# Patient Record
Sex: Female | Born: 1945 | Race: Black or African American | Hispanic: No | Marital: Single | Smoking: Former smoker
Health system: Southern US, Community
[De-identification: ages and names within clinical notes are randomized; demographics above are authoritative.]

## PROBLEM LIST (undated history)

## (undated) DIAGNOSIS — R3915 Urgency of urination: Secondary | ICD-10-CM

## (undated) DIAGNOSIS — E039 Hypothyroidism, unspecified: Secondary | ICD-10-CM

## (undated) DIAGNOSIS — K5790 Diverticulosis of intestine, part unspecified, without perforation or abscess without bleeding: Secondary | ICD-10-CM

## (undated) DIAGNOSIS — Z9289 Personal history of other medical treatment: Secondary | ICD-10-CM

## (undated) DIAGNOSIS — I4892 Unspecified atrial flutter: Secondary | ICD-10-CM

## (undated) DIAGNOSIS — I340 Nonrheumatic mitral (valve) insufficiency: Secondary | ICD-10-CM

## (undated) DIAGNOSIS — E119 Type 2 diabetes mellitus without complications: Secondary | ICD-10-CM

## (undated) DIAGNOSIS — I428 Other cardiomyopathies: Secondary | ICD-10-CM

## (undated) DIAGNOSIS — I4510 Unspecified right bundle-branch block: Secondary | ICD-10-CM

## (undated) DIAGNOSIS — Q246 Congenital heart block: Secondary | ICD-10-CM

## (undated) DIAGNOSIS — I4891 Unspecified atrial fibrillation: Secondary | ICD-10-CM

## (undated) DIAGNOSIS — M1990 Unspecified osteoarthritis, unspecified site: Secondary | ICD-10-CM

## (undated) DIAGNOSIS — I10 Essential (primary) hypertension: Secondary | ICD-10-CM

## (undated) DIAGNOSIS — E785 Hyperlipidemia, unspecified: Secondary | ICD-10-CM

## (undated) HISTORY — PX: COLONOSCOPY: SHX174

## (undated) HISTORY — PX: US ECHOCARDIOGRAPHY: HXRAD669

## (undated) HISTORY — DX: Essential (primary) hypertension: I10

## (undated) HISTORY — DX: Unspecified atrial flutter: I48.92

## (undated) HISTORY — DX: Congenital heart block: Q24.6

## (undated) HISTORY — DX: Unspecified right bundle-branch block: I45.10

## (undated) HISTORY — PX: ABDOMINAL HYSTERECTOMY: SHX81

## (undated) HISTORY — DX: Nonrheumatic mitral (valve) insufficiency: I34.0

## (undated) HISTORY — DX: Hyperlipidemia, unspecified: E78.5

## (undated) HISTORY — DX: Unspecified atrial fibrillation: I48.91

## (undated) HISTORY — DX: Other cardiomyopathies: I42.8

---

## 1999-10-02 ENCOUNTER — Encounter: Admission: RE | Admit: 1999-10-02 | Discharge: 1999-10-02 | Payer: Self-pay | Admitting: *Deleted

## 1999-10-02 ENCOUNTER — Encounter: Payer: Self-pay | Admitting: *Deleted

## 2001-02-17 ENCOUNTER — Encounter: Payer: Self-pay | Admitting: Internal Medicine

## 2001-02-17 ENCOUNTER — Encounter: Admission: RE | Admit: 2001-02-17 | Discharge: 2001-02-17 | Payer: Self-pay | Admitting: Internal Medicine

## 2003-08-26 DIAGNOSIS — Q246 Congenital heart block: Secondary | ICD-10-CM

## 2003-08-26 HISTORY — DX: Congenital heart block: Q24.6

## 2003-09-14 HISTORY — PX: CARDIOVASCULAR STRESS TEST: SHX262

## 2003-10-20 HISTORY — PX: CARDIAC CATHETERIZATION: SHX172

## 2003-11-03 ENCOUNTER — Encounter (INDEPENDENT_AMBULATORY_CARE_PROVIDER_SITE_OTHER): Payer: Self-pay | Admitting: Cardiology

## 2003-11-03 ENCOUNTER — Ambulatory Visit (HOSPITAL_COMMUNITY): Admission: RE | Admit: 2003-11-03 | Discharge: 2003-11-03 | Payer: Self-pay | Admitting: Cardiology

## 2004-01-29 DIAGNOSIS — Z9889 Other specified postprocedural states: Secondary | ICD-10-CM | POA: Insufficient documentation

## 2004-01-29 DIAGNOSIS — I428 Other cardiomyopathies: Secondary | ICD-10-CM

## 2004-01-29 HISTORY — DX: Other cardiomyopathies: I42.8

## 2004-02-06 ENCOUNTER — Inpatient Hospital Stay (HOSPITAL_COMMUNITY): Admission: RE | Admit: 2004-02-06 | Discharge: 2004-02-14 | Payer: Self-pay | Admitting: Cardiothoracic Surgery

## 2004-02-06 ENCOUNTER — Encounter (INDEPENDENT_AMBULATORY_CARE_PROVIDER_SITE_OTHER): Payer: Self-pay | Admitting: Specialist

## 2004-02-06 HISTORY — PX: MITRAL VALVE ANNULOPLASTY: SHX2038

## 2004-02-13 HISTORY — PX: PERMANENT PACEMAKER INSERTION: SHX6023

## 2004-02-21 DIAGNOSIS — I4892 Unspecified atrial flutter: Secondary | ICD-10-CM

## 2004-02-21 HISTORY — DX: Unspecified atrial flutter: I48.92

## 2004-03-01 ENCOUNTER — Encounter: Admission: RE | Admit: 2004-03-01 | Discharge: 2004-03-01 | Payer: Self-pay | Admitting: Cardiothoracic Surgery

## 2004-03-05 ENCOUNTER — Encounter (HOSPITAL_COMMUNITY): Admission: RE | Admit: 2004-03-05 | Discharge: 2004-06-03 | Payer: Self-pay | Admitting: Cardiology

## 2008-11-16 ENCOUNTER — Emergency Department (HOSPITAL_COMMUNITY): Admission: EM | Admit: 2008-11-16 | Discharge: 2008-11-16 | Payer: Self-pay | Admitting: Family Medicine

## 2010-02-18 ENCOUNTER — Encounter: Payer: Self-pay | Admitting: Cardiothoracic Surgery

## 2010-06-15 NOTE — Op Note (Signed)
NAMESHANEESE, [name]               ACCOUNT NO.:  [id number]   MEDICAL RECORD NO.:  [id number]          PATIENT TYPE:  INP   LOCATION:  2013                         FACILITY:  [facility]   PHYSICIAN:  Richard A. Alanda Amass, M.D.DATE OF BIRTH:  Mar 30, 1945   DATE OF PROCEDURE:  02/13/2004  DATE OF DISCHARGE:                                 OPERATIVE REPORT   PROCEDURE:  Implantation of permanent DDD-R Medtronic Enpulse model number  [id number], serial number [id number],  pulse generator, with new bipolar active-  fixation steroid-eluting in-line coaxial Medtronic atrial electrode, model  number 5076-52 cm, serial  number [id number], and using existing epicardial  screw-in unipolar Medtronic number 5071-53 cm, serial number [id number],  electrode (date of implantation February 06, 2004).  Abandoning capped,  retained in pocket, second epicardial unipolar Medtronic screw-in electrode.   IMPLANTING PHYSICIAN:  Richard A. Alanda Amass, M.D.   COMPLICATIONS:  None.   ESTIMATED BLOOD LOSS:  Approximately 50 mL.   ANESTHESIA:  Valium 5 mg p.o. premedication, 6 mg Nubain IV in divided  doses, 2 mg Versed.   Lopressor 5 mg IV x2, 1% local Xylocaine, 1 g of Ancef IV antibiotic  prophylaxis.   PREOPERATIVE DIAGNOSES:  1.  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  2.  Severe mitral regurgitation requiring mitral valve repair with anterior      leaflet reconstruction and Edwards annuloplasty ring February 06, 2004.  3.  Persistent postoperative high-grade heart block with well-preserved      ejection fraction, 50%, on MUGA scan, with temporary pacing.  4.  Normal coronary arteries.  5.  Systemic hypertension.  6.  Hyperlipidemia.  7.  Permanent epicardial unipolar leads, screw-in, Medtronic 5071-53 cm, at      time of mitral valve repair, February 06, 2004, with excellent acute R-      waves and thresholds.   POSTOPERATIVE DIAGNOSES:  1.   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  2.  Severe mitral regurgitation requiring mitral valve repair with anterior      leaflet reconstruction and Edwards annuloplasty ring February 06, 2004.  3.  Persistent postoperative high-grade heart block with well-preserved      ejection fraction, 50%, on MUGA scan, with temporary pacing.  4.  Normal coronary arteries.  5.  Systemic hypertension.  6.  Hyperlipidemia.  7.  Permanent epicardial unipolar leads, screw-in, Medtronic 5071-53 cm, at      time of mitral valve repair, February 06, 2004, with excellent acute R-      waves and thresholds.   PROCEDURE:  The patient was brought to the second floor CP lab in a  postabsorptive state after premedication with 5 mg Valium p.o.  She was  temporarily pacing, which was turned down to 40 per minute back-up, through  her temporary epicardial leads.  She was premedicated with 1 g of Ancef and  5 mg of Valium p.o.  The left anterior chest was prepped, draped in the  usual manner, 1%  Xylocaine was used for local anesthesia.  Fluoroscopy was  used to locate the prior permanent screw-in epicardial leads left at the  time of mitral valve repair.  A left infraclavicular transverse incision was  performed and brought down to the prepectoral fascia using blunt dissection,  electrocautery to control hemostasis, and 1% Xylocaine anesthesia  throughout.  A pulse generator pocket was then formed.  Because of eschar  over the previous parasternal incision from her temporary wires and ability  to get to them in the least traumatic way, it was elected to open the  previous incision.  This was done with a short transverse incision.  The  previous sutures were removed.  Using blunt dissection, the electrodes were  located and identified.   One of the electrodes was uncapped, which had the best R-waves and excellent  threshold at implant.  Threshold testing was  performed on this unipolar  ventricular electrode.   R equals 9.4 mV, impedance 404 Ohms, minimal threshold for capture is 0.9 V,  current 0.9 mA.  It was elected to use this electrode.  The second electrode  was identified and the silicone cap was kept in place.  A small tunnel was  then made from the pacemaker pocket to the parasternal subcutaneous lead  location, and the leads were pulled through to the pacemaker pocket without  difficulty.  The subclavian vein was then entered with a single anterior  puncture using an 18 thin-walled needle under fluoroscopic control entering  the extrathoracic subclavian.  An 8 French peel-away Cook introducer was  then inserted over a stainless steel J-tip guidewire, which was retained  until the lead was positioned.  The atrial lead was inserted through the  peel-away sheath.  The sheath was peeled away.  Under fluoroscopic control  the electrode was inserted into the right atrial appendage using the  preformed J guidewire, which was then removed after active fixation with  screwing in the lead under fluoroscopic control.  Threshold testing was then  performed, unipolar and bipolar.   Bipolar atrial:  P equals 3.6 mV, threshold 0.8 V, resistance 484 Ohms.   Unipolar atrial:  P equals 2.0 mV, resistance 379 Ohms, threshold 0.4 V.   There was no retrograde V-A conduction with ventricular pacing and atrial  recording through the implanted electrodes.  Current of injury and slew rate  were good for both atrial and ventricular electrodes.   The capped ventricular electrode (serial number [id number], DOI February 06, 2004) was looped and positioned in the inferior portion of the pocket behind  the generator.  The pocket was irrigated with 500 mg of kanamycin solution,  as was the parasternal incision.  The generator was hooked to the atrium in the proper AV sequence with the single hex nut tightened.  The generator was  delivered into the pocket with  the electrodes looped behind it, loosely  secured to the underlying muscle and fascia with a #1 silk suture to prevent  migration.  The subcutaneous tissue was closed with two separate running  layers of 2-0 Vicryl suture, and the skin was closed with 4-0 subcuticular  Vicryl suture.  The parasternal incision was likewise closed with two  separate running layers of 2-0 Vicryl and then the skin was closed with 4-0  subcuticular Vicryl.  Steri-Strips were applied.  Fluoroscopy showed  good position of the ventricular electrodes and the epicardial LV  electrodes.  There was no pneumothorax.  The sponge count was correct.  The  patient was transferred to the holding area for postoperative care and  programming in stable condition.      RAW/MEDQ  D:  02/13/2004  T:  02/13/2004  Job:  [id number]   cc:   Sheliah Plane, MD  [address]  [place]  [place] [address]   Cristy Hilts. Jacinto Halim, MD  [address]  [place]  [place] [address]  Fax: [phone]   Merlene Laughter. Renae Gloss, M.D.  [address]  [address]  [place]  [place] [address]  Fax: [phone]

## 2010-06-15 NOTE — Op Note (Signed)
[name]               ACCOUNT NO.:  [id number]   MEDICAL RECORD NO.:  [id number]          PATIENT TYPE:  INP   LOCATION:  2310                         FACILITY:  [facility]   PHYSICIAN:  Sheliah Plane, MD    DATE OF BIRTH:  Jun 11, 1945   DATE OF PROCEDURE:  02/06/2004  DATE OF DISCHARGE:                                 OPERATIVE REPORT   PREOPERATIVE DIAGNOSIS:  Severe mitral regurgitation, congenital heart  block.   POSTOPERATIVE DIAGNOSIS:  Severe mitral regurgitation, congenital heart  block.   SURGICAL PROCEDURE:  Mitral valve repair with reconstruction of anterior  leaflet cordis with gortex and ring annuloplasty and placement of two left  ventricular screw-in epicardial leads for possible biventricular pacing.  Closure of the left atrial appendage.   SURGEON:  Sheliah Plane, MD   FIRST ASSISTANT:  Toribio Harbour, N.P.   BRIEF HISTORY:  Patient is a 65 year old female who has been known for at  least several years to have congenital heart block, running heart rates in  the 40s, relatively asymptomatic; however, because of increasing shortness  of breath and especially dyspnea with exertion, she was referred to Dr.  Jacinto Halim for further evaluation.  Echocardiogram showed severe mitral  regurgitation.  Cardiac catheterization showed small but patent coronary  arteries without any significant stenosis.  Because of the severe mitral  regurgitation, mitral valve repair and/or replacement was recommended to the  patient.  After consultation with Dr. Amil Amen, in addition, who concurred,  patient agreed to proceed with mitral valve repair and placement of  epicardial leads.   DESCRIPTION OF PROCEDURE:  With Swan-Ganz and arterial line monitors in  place, the patient underwent general endotracheal anesthesia without  incidence.  The skin over the chest and lungs was prepped with Betadine and  draped in the usual sterile manner.  A transesophageal echo probe was  placed, and the findings are dictated in a separate note by Dr. Krista Blue;  however, in summary, the patient was noted to have severe mitral  regurgitation.  On close examination of the mitral valve, it appeared that  the defect was a central regurgitant jet related to annular and ventricular  dilatation.  In addition, on the anterior leaflet at P3, there was an area  of over-riding anterior leaflet with a posterior jet hugging the posterior  wall of the atrium.  A median sternotomy was performed.  The patient was  systemically heparinized.  The ascending aorta was cannulated superiorly,  and inferior vena cava cannula was replaced.  A retrograde cardioplegia  catheter was replaced.  An aortic root vent cardioplegia needle was then  introduced into the ascending aorta.  Patient was placed on cardiopulmonary  bypass, 2.4 L/min/m2.  The patient's body temperature was cooled to 30  degrees.  Aortic cross-clamp was applied, and 500 cc of cold potassium  cardioplegia was administered antegrade.  In addition, retrograde  cardioplegia was administered.  Attention was then turned to the left  interatrial groove where the left atriotomy was performed along the  interatrial groove with proper retraction.  The mitral valve was visualized.  Careful  inspection confirmed the TEE findings.  There were no ruptured  cordis.  We proceed with repair, first by placing two separate 5.0 Gore-Tex  sutures along the P3 area of the anterior leaflet to shorten the cordis in  this area.  The anterior leaflet and annulus were sized for a [address] angioplasty ring, model 4100, serial [id number].  Then #2 Tycron  sutures were placed circumferentially around the annulus, and the ring  secured in place.  With the combination of the annuloplasty ring and the  shortening of the cordal attachments of P3, a very competent valve was  achieved.  The patient's body temperature was gradually rewarmed.  Throughout the  procedure, intermittent retrograde cold blood cardioplegia  was administered through the coronary sinus.  The atriotomy was closed as  the left atrium was deaired.  An aortic cross clamp was removed.  Total  cross clamp time was 103 minutes.  A 16-gauge needle was introduced into the  left ventricular apex, further deairing the ventricle.  The patient  spontaneously converted back to her preop complete heart block.  Two atrial  and ventricular pacing wires were applied.  Prior to removal of the cross  clamp, two screw-in epicardial leads were placed on the lateral wall of the  left ventricle.  In addition, while the atrium was opened, a 4-0 Prolene  suture was used in a running manner to obliterate the opening of the left  atrial appendage.  With the patient rewarmed and DDD pacing, the mitral  valve was again inspected with the TEE, and was without any regurgitation.  Patient was then ventilated and weaned from cardiopulmonary bypass without  difficulty.  She remained hemodynamically stable and was decannulated in the  usual fashion.  Protamine sulfate was administered.  The pericardium was  loosely reapproximated.  An opening in the lateral side of the pericardium  was created to allow passage of the epicardial leads through the left chest  and into a subcutaneous pocket created in the left infraclavicular area.  The leads were tested, and the Medtronic screw-in lead with the serial  #[id number] Vicryl had a voltage threshold of 0.5 volts, an R wave of 8.1  millivolts, impedance of 455 ohm.  The Medtronic pacing lead with the serial  #[id number] had a voltage threshold of 0.7 volts, R wave of 12.2 millivolts,  and impedance of 446 ohm.  Each of the leads were capped and left in the  subcutaneous pocket, which was closed with interrupted 0 Vicryl and a 4-0  subcuticular stitch.  Two mediastinal tubes were left in place.  The pericardium was reapproximated.  The sternum was closed with #16  stainless  steel wire.  The fascia was closed with interrupted 0 Vicryl and running 3-0  Vicryl in the subcutaneous tissues, and a 4-0 subcuticular stitch in the  skin edges.  Dry dressings were applied.  Sponge and needle count was  reported as correct at the end of the procedure.  Patient tolerated the  procedure without obvious complication and was transferred to the surgical  intensive care unit for further postoperative care.  Total pump time was 129  minutes.      Edwa   EG/MEDQ  D:  02/06/2004  T:  02/06/2004  Job:  [id number]   cc:   Cristy Hilts. Jacinto Halim, MD  [address]  [place]  [place] [address]  Fax: [phone]

## 2010-06-15 NOTE — Consult Note (Signed)
NAMEJARRETT, Michelle               ACCOUNT NO.:  [id number]   MEDICAL RECORD NO.:  [id number]          PATIENT TYPE:  INP   LOCATION:  NA                           FACILITY:  [facility]   PHYSICIAN:  Mark E. Severiano Gilbert, M.D.    DATE OF BIRTH:  01/24/1946   DATE OF CONSULTATION:  02/09/2004  DATE OF DISCHARGE:                                   CONSULTATION   REASON FOR CONSULTATION:  Pacemaker implantation for complete heart block.   HISTORY OF PRESENT ILLNESS:  This is a 65 year old African American female  with long-standing complete heart block secondary to congenital complete  heart block.  She was evaluated extensively over the last one year because  of declining exercise tolerance, symptoms of symptomatic bradycardia, and  congestive heart failure type symptoms such as shortness of breath at rest  as well as dyspnea on exertion.  Her functional status before the operation  was class 3.  She was evaluated with echocardiography, both transthoracic  and transesophageal, angiography, and surgical evaluation.  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  Her  ejection fraction most recently measured by angiography was 40% in the  setting of 4+ MR.  Almost certainly, her actual ejection fraction is  significantly worse given the unloading effect of severe MR.  After a  careful preoperative evaluation, she underwent successful repair of her  mitral valve several days ago and has done well postoperatively.  She is  meeting her rehab goals at the current time but is being paced 100% of the  time AV sequentially with temporary pacing wires placed at the time of  surgery.  Because of the likely need for pacing in this patient as well as  the very likely need for biventricular pacing given her underlying disease  mechanism, at the time of operation, two left lateral LV epicardial leads  were placed.  At this time, we have been asked to see the patient for need  for  pacemaker implantation.   PAST MEDICAL HISTORY:  1.  Congenital complete heart block.  2.  Congestive heart failure.  3.  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  4.  Essential hypertension.   MEDICATIONS:  Altace 2.5 mg p.o. daily, aspirin 325 mg p.o. daily, Zinacef,  Zofran for sleep, Ultram for pain, p.r.n. Lasix and potassium supplement.   ALLERGIES:  No known drug allergies although she seems to be allergic to  shellfish.   FAMILY HISTORY:  Noncontributory.   SOCIAL HISTORY:  She is retired from [employer] as a Solicitor.  There is a past  history of smoking.  She is single.  Currently, no alcohol or caffeine.   REVIEW OF SYMPTOMS:  Weight has been currently stable postoperatively, no  fevers, sweats, or chills.  She has minor arthritic complaints.  She does  not wear glasses.  She does have dyspnea on exertion.  Cardiovascular is  remarkable for congestive heart failure and symptoms of fluid retention from  time to time.  GI:  No dyspepsia.  GU:  No urgency or frequency.  Endocrine:  No  diabetic symptoms.  Neurological:  No TIA symptoms.  HEENT:  Negative.  Skin:  No rash or dermatitis.  Psychiatric:  She is not depressed or manic.   PHYSICAL EXAMINATION:  GENERAL:  She is an obese African American female sitting on the bed in no  acute distress.  VITAL SIGNS:  Blood pressure 112/69, pulse 85, respirations 20, T-max 100.3,  saturation 90%.  NECK:  Supple, 2+ carotids, no bruits, no JVP, no thyromegaly, no  adenopathy.  HEENT:  Normocephalic, atraumatic, moist mucous membranes, midline nasal  septum.  BACK:  Negative CVAT.  CHEST:  Normal female.  RESPIRATORY:  Clear to auscultation and percussion with occasional bibasilar  rales.  CARDIOVASCULAR:  Regular rate and rhythm, normal S1, widely split S2, no S3,  no S4, soft 1/6 systolic ejection murmur, unable to appreciate a mitral  regurgitation murmur, no rub, no gallop.  ABDOMEN:  Obese, positive  bowel sounds, nontender, no hepatosplenomegaly, no  masses, no bruits.  GU/RECTAL:  Deferred.  EXTREMITIES:  No cyanosis or clubbing, there is trace edema.  NEUROLOGICAL:  Pleasant, alert, oriented x 4, motor and sensory nonfocal,  cranial nerves 2-12 appeared to be intact.   Telemetry reviewed 100% A-paced and V-paced using a dual chamber temporary  pacer using implanted temporary leads.  Hematocrit 26, platelet count  150,000.  Potassium 3.9, creatinine 0.8.   ASSESSMENT:  1.  Congenital complete heart block.  The patient does need long term dual      chamber pacing.  This is based on her underlying diagnosis, her history      of symptomatic bradycardia, and the need for appropriate heart rate      response to activity to maximize the benefits from recent cardiothoracic      surgery.  2.  Congestive heart failure and dilated nonischemic cardiomyopathy.  The      patient had ejection fraction of 40% and did not receive      revascularization.  Ejection fraction of 40% was measured during a time      of severe mitral regurgitation.  It would not be unreasonable to expect      the actual ejection fraction now that her mitral regurgitation has been      palliated with repair has fallen.  As such, given declining ejection      fraction below 35% in the setting of chronic class 3 heart failure and      the need for dual chamber pacing with the attendant bundle branch block      and widened QRS, she would be a candidate for biventricular pacing.  On      the other hand, if her ejection fraction is actually greater than 35%      after operative repair, I think she just needs at this time the      indication for dual chamber pacing.  If I were to dual chamber pace this      patient, I would use the left LV leads that are already implanted and      would just implant a transvenous atrial lead.  I have asked that the     team check a MUGA to get an objective ejection fraction.  Since the       patient is paced right now, her rhythm is regular and it should be a      relatively easy way to exactly determine what her ejection fraction is      and proceed based on  that criteria.      Mark   MEP/MEDQ  D:  02/09/2004  T:  02/09/2004  Job:  [id number]

## 2010-06-15 NOTE — Discharge Summary (Signed)
[name], [name]               ACCOUNT NO.:  [id number]   MEDICAL RECORD NO.:  [id number]          PATIENT TYPE:  INP   LOCATION:  2013                         FACILITY:  [facility]   PHYSICIAN:  Sheliah Plane, MD    DATE OF BIRTH:  Oct 30, 1945   DATE OF ADMISSION:  02/06/2004  DATE OF DISCHARGE:  02/14/2004                                 DISCHARGE SUMMARY   ADMISSION DIAGNOSES:  1.  Left ventricular dysfunction and mitral valve regurgitation.  2.  Congenital heart block.   PAST MEDICAL HISTORY:  Congenital heart block.   PAST SURGICAL HISTORY:  Hysterectomy.   ALLERGIES:  This patient has allergy to shell fish which causes oral numbness.   DISCHARGE DIAGNOSES:  1.  Left ventricular dysfunction and mitral valve regurgitation, status post      mitral valve repair.  2.  Congenital heart block, status post  permanent pacemaker insertion.   BRIEF HISTORY:  [name] is a 65 year old African-American female.  She  was to see her primary care physician and underwent EKG which revealed  ectopy. She was then referred to Dr. Jacinto Halim for further evaluation because of  her known heart block.  Exercise stress test was performed but was stopped  early because of significant ventricular arrhythmias. She was noted to have  a murmur of mitral insufficiency.  Dr. Jacinto Halim recommended cardiac  catheterization.  At the time of catheterization, she was noted to have a  __________ in the LAD but without any critical lesions, dilated  cardiomyopathy with significant mitral valve regurgitation. Transesophageal  echocardiogram revealed significant mitral valve regurgitation.  [name]  was referred to Dr. Tyrone Sage for consideration of surgical intervention  regarding her mitral valve.  Dr. Tyrone Sage saw [name] at the [facility] office  on November 10, 2003. After examination of the patient and review of all  available records, Dr. Tyrone Sage felt the appropriate treatment was mitral  valve repair or  replacement.  In addition because of her known congenital  heart block with slow heart rate, he felt she could probably benefit from  biventricular pacing and epicardial lead could be placed at the time of her  surgery.  These procedures, risks and benefits were all discussed with [name].  She decided to obtain a second opinion before making her final  decision.  On December 29, 2003, [name] returned to the [facility] office and  Dr. Tyrone Sage. The procedure risks and benefits were again reviewed and [name] decided to proceed with surgery. She was scheduled for elective  admission in January 2006.   HOSPITAL COURSE:  On February 06, 2004, [name] was electively admitted to  [facility] in the care of Dr. Sheliah Plane. She underwent the  following surgical procedure.   1.  Mitral valve repair with reconstruction of anterior leaf cords with Gore-      Tex and a ring annuloplasty.  2.  Placement of two left ventricular screw and epicardial leads.  3.  Closure of left atrial appendage.   [name] tolerated these procedures well transferred in stable condition  to the S[facility]. She remained hemodynamically stable in the immediate  postoperative period and was extubated several hours after arrival  __________.  She awoke from anesthesia neurologically intact.  [name]  required only routine care in the intensive care unit and was ready for  transfer to unit 2000 on postoperative day two.  Initiation of prophylactic  Coumadin therapy for her mitral valve repair was delayed because of planned  permanent pacemaker insertion.  Cardiology was consulted.  Permanent  pacemaker insertion was scheduled for Monday, January 16.   [name] postoperative course has been essentially uneventful.  She is  making very good progress recovering from her surgery.  Today, February 14, 2004, this is postoperative day eight after her mitral valve repair and  postoperative day one after her  permanent pacemaker insertion. [name]  vital signs are stable.  Blood pressure 123/59, she is afebrile, her room  air saturation is 95%.  Her heart is paced at a rate of 95 beats per minute.  Her lungs are clear to auscultation.  Her chest x-ray this morning following  permanent pacemaker insertion revealed no pneumoperitoneum. She does  continue to have tiny bilateral effusions.  She is tolerating her diet  without nausea.  Her bowel and bladder function is within normal limits. Her  chest incision is healing well.  Her incisions from her pacemaker insertion  are covered with Steri-Strips and they are clean and dry.  She is ambulating  with minimal assistance, her pain is well controlled. [name] will be  ready for discharge home later this afternoon after her temporary pacing  leads have been removed.  Because of some postoperative anemia, cardiology  elected to transfuse [name] for a hemoglobin of 8.2.  Her hemoglobin  responded to this and is 9.2 today January 17.   Laboratory studies January 17:  CBC, white blood cell is 8.6, hemoglobin  9.2, hematocrit 27.1, platelets 277.  Chemistries include a sodium of 134,  potassium 4.0, BUN 11, creatinine 0.9, glucose 89.   CONDITION ON DISCHARGE:  Improved.   DISCHARGE MEDICATIONS:  1.  Enteric coated aspirin 81 mg q.d.  2.  Lopressor 50 mg b.i.d.  3.  Altace 2.5 mg q.d.  4.  Coumadin 5 mg q.d. or as directed.  5.  Folic acid 1 mg q.d.  6.  Niferex 150 mg b.i.d.   PAIN MANAGEMENT:  She can have Ultram 50 mg, 1-2 p.o. q.4 h. for moderate to  severe pain or Tylenol p.r.n. 325 mg, 1-2 p.o. q.3 h. for mild pain.   ACTIVITY:  She has been asked to refrain from any driving or heavy lifting,  pushing or pulling more than 10 pounds. She is also instructed to continue  her breathing exercises and daily walking. She has further activity  limitations related to her permanent pacemaker insertion and has been given instructions  regarding this.   DIET:  She will continue on a low fat low salt diet.   WOUND CARE:  She is to follow wound care instructions per her pacemaker  insertion instructions.   FOLLOW UP:  1.  She is to have PT and INR blood work on Friday, January 20 at      [facility]. She has been      asked to call to arrange that appointment. A Coumadin flow sheet will be      faxed to the Coumadin clinic at discharge.  2.  She  has an appointment at [facility] on      January 24 at 11:15 to see Dr. Alanda Amass to followup her pacemaker and      then on January 30 at 11:15 to see Dr. Jacinto Halim.  3.  Dr. Tyrone Sage would like to see her back in the CVTS office on Thursday      February 9 at 11:30 a.m.  She will be asked to have a chest x-ray at [facility]      one hour prior to this appointment and bring the film for Dr. Tyrone Sage      to review.      [place]   CTK/MEDQ  D:  02/14/2004  T:  02/14/2004  Job:  [id number]   cc:   Cristy Hilts. Jacinto Halim, MD  [address]  [place]  [place] [address]  Fax: [phone]   Merlene Laughter. Renae Gloss, M.D.  [address]  [address]  [place]  [place] [address]  Fax: [phone]

## 2010-06-15 NOTE — Op Note (Signed)
NAMEJALEIGH, [name]               ACCOUNT NO.:  [id number]   MEDICAL RECORD NO.:  [id number]          PATIENT TYPE:  INP   LOCATION:  NA                           FACILITY:  [facility]   PHYSICIAN:  Quita Skye. Krista Blue, M.D.  DATE OF BIRTH:  1945-05-20   DATE OF PROCEDURE:  02/06/2004  DATE OF DISCHARGE:                                 OPERATIVE REPORT   PROCEDURE PERFORMED:  Transesophageal echocardiogram.   INDICATIONS FOR PROCEDURE:  [name] is a 65 year old black female who  returns to the operating room with mitral insufficiency and congenital heart  block.  The patient is to undergo mitral valve repair and placement of  epicardial lead.  Dr. Ofilia Neas requested transesophageal echocardiogram  for the intraoperative management of the patient.   Following routine cardiac induction, the transesophageal echo probe was  coated with a latex free sheath and lubricated.  A tooth guard was placed in  the patient's mouth and the esophageal probe was advanced carefully into the  esophagus  for cardiac imaging.  Overall, images of the heart showed no  evidence of pericardial effusion of tamponade.  The right atrium appeared to  be normal in size.  The interatrial septum was without defect.  The  tricuspid valve had mild regurgitation with a central jet tracking back into  the atrium.  There was no evidence of prolapse and the overall structure of  the valve appeared normal. The right ventricle had no evidence of segmental  wall motion abnormality although it had slightly decreased contractility.  The left atrium was normal in size without evidence of thrombus or masses.  The left atrial appendage was clear of masses.  The mitral valve had  moderate regurgitation. There was no evidence of flail or prolapse, but the  anterior leaflet did appear that the jet was centrally located traveling  back almost to the edge of the atrium. The valve had some increase in the  regurgitant jet towards the  posterior commissure and in the central part of  the coaptation point of the valve.  The pulmonary vein showed no evidence of  reversal of flow with a normal pattern, S-wave 57 cm per second and the D-  wave 35 cm per second.  The left ventricle had moderately decreased  contractility which was global in nature.  There was no evidence of regional  segmental wall motion abnormality.  The aortic valve had mild regurgitation  with a central appearing jet, traveling back to the midportion of the  anterior leaflet of the mitral valve.  There was no evidence of aortic  stenosis and the thoracic aorta had no evidence of atherosclerotic disease.  The patient underwent a ring valvuloplasty and before separating from bypass  machine, the valve was evaluated which showed some mild slight regurgitation  and some turbulence at the opening of the valve. This was felt to be  acceptable for this patient and the patient successfully separated from  bypass.  Evaluations of the valve following bypass separation showed some  slight regurgitation but overall significantly reduced regurgitation of the  mitral valve.  The transesophageal echo probe was removed.  The patient did  well and was taken to the SICU in good condition.       JDS/MEDQ  D:  02/06/2004  T:  02/06/2004  Job:  [id number]

## 2012-04-13 ENCOUNTER — Ambulatory Visit: Payer: Self-pay | Admitting: Cardiovascular Disease

## 2012-04-13 DIAGNOSIS — I4891 Unspecified atrial fibrillation: Secondary | ICD-10-CM

## 2012-04-13 DIAGNOSIS — I480 Paroxysmal atrial fibrillation: Secondary | ICD-10-CM | POA: Insufficient documentation

## 2012-04-13 DIAGNOSIS — Z7901 Long term (current) use of anticoagulants: Secondary | ICD-10-CM | POA: Insufficient documentation

## 2012-06-10 ENCOUNTER — Other Ambulatory Visit: Payer: Self-pay | Admitting: Cardiovascular Disease

## 2012-06-10 ENCOUNTER — Telehealth: Payer: Self-pay | Admitting: *Deleted

## 2012-06-10 DIAGNOSIS — I498 Other specified cardiac arrhythmias: Secondary | ICD-10-CM

## 2012-06-10 DIAGNOSIS — I4891 Unspecified atrial fibrillation: Secondary | ICD-10-CM

## 2012-06-10 NOTE — Telephone Encounter (Signed)
LMTCB. Pt's pacemaker has hit ERI per today's [employer] transmission.

## 2012-06-15 ENCOUNTER — Telehealth: Payer: Self-pay | Admitting: *Deleted

## 2012-06-19 ENCOUNTER — Telehealth: Payer: Self-pay | Admitting: *Deleted

## 2012-06-19 NOTE — Telephone Encounter (Signed)
[name] returned my call today, and I informed her that her most recent pacemaker transmission showed that she had finally reached ERI. I gave her the option to either schedule the procedure and have Dr. Royann Shivers discuss the risks and benefits on the day of, or either come into the office and see an extender, and then schedule the procedure afterwards. She preferred to come into the office, so scheduling was deferred to [facility].

## 2012-06-19 NOTE — Telephone Encounter (Signed)
See note dated 5/23

## 2012-06-23 ENCOUNTER — Other Ambulatory Visit: Payer: Self-pay | Admitting: *Deleted

## 2012-06-23 MED ORDER — FOLIC ACID 1 MG PO TABS: 1.0000 mg | ORAL_TABLET | Freq: Every day | ORAL | Status: DC

## 2012-06-25 ENCOUNTER — Encounter: Payer: Self-pay | Admitting: *Deleted

## 2012-06-26 ENCOUNTER — Encounter: Payer: Self-pay | Admitting: Cardiology

## 2012-06-26 ENCOUNTER — Encounter: Payer: Self-pay | Admitting: Cardiovascular Disease

## 2012-06-26 ENCOUNTER — Encounter: Payer: Self-pay | Admitting: Allergy and Immunology

## 2012-06-26 ENCOUNTER — Ambulatory Visit (INDEPENDENT_AMBULATORY_CARE_PROVIDER_SITE_OTHER): Payer: 59 | Admitting: Cardiology

## 2012-06-26 ENCOUNTER — Ambulatory Visit: Payer: 59 | Admitting: Pharmacist Clinician (PhC)/ Clinical Pharmacy Specialist

## 2012-06-26 ENCOUNTER — Encounter: Payer: Self-pay | Admitting: *Deleted

## 2012-06-26 VITALS — BP 118/70 | HR 65 | Ht 63.0 in | Wt 221.0 lb

## 2012-06-26 DIAGNOSIS — Z950 Presence of cardiac pacemaker: Secondary | ICD-10-CM | POA: Insufficient documentation

## 2012-06-26 DIAGNOSIS — J069 Acute upper respiratory infection, unspecified: Secondary | ICD-10-CM

## 2012-06-26 DIAGNOSIS — Z9889 Other specified postprocedural states: Secondary | ICD-10-CM

## 2012-06-26 DIAGNOSIS — I4891 Unspecified atrial fibrillation: Secondary | ICD-10-CM

## 2012-06-26 DIAGNOSIS — Z45010 Encounter for checking and testing of cardiac pacemaker pulse generator [battery]: Secondary | ICD-10-CM

## 2012-06-26 DIAGNOSIS — I480 Paroxysmal atrial fibrillation: Secondary | ICD-10-CM

## 2012-06-26 DIAGNOSIS — Z45018 Encounter for adjustment and management of other part of cardiac pacemaker: Secondary | ICD-10-CM

## 2012-06-26 LAB — PACEMAKER DEVICE OBSERVATION

## 2012-06-26 MED ORDER — CEPHALEXIN 500 MG PO CAPS: 500.0000 mg | ORAL_CAPSULE | Freq: Four times a day (QID) | ORAL | Status: DC

## 2012-06-26 MED ORDER — GUAIFENESIN ER 600 MG PO TB12: 1200.0000 mg | ORAL_TABLET | Freq: Two times a day (BID) | ORAL | Status: DC

## 2012-06-26 NOTE — Assessment & Plan Note (Signed)
MVRepair 02/06/04 secondary to severe mitral regurtation.  Last echo 09/10/10 without regurgitation, ring in place and stable.  EF > 55%

## 2012-06-26 NOTE — Assessment & Plan Note (Signed)
Cough is productive with brown mucus.  Have added Keflex 500 mg QID for 5 days and Musinex 1200 mg BID for 5 days.  She is taking tussinex at home for cough.

## 2012-06-26 NOTE — Progress Notes (Addendum)
[facility]  06/26/2012   PCP: Alva Garnet., MD   Chief Complaint  Patient presents with  . discuss generator change out    sob, cough, no fever    Primary Cardiologist: Dr Royann Shivers  HPI:  The patient is a 67 year old woman with a history of sinus node dysfunction as well as high-grade atrioventricular block and tachycardia-bradycardia syndrome due to paroxysmal atrial fibrillation, who also has a dual-chamber permanent pacemaker  medtronic  EnPulse.  On recent eval she was found to be at [facility].  Pacing at 99.8 % of the time.  No episodes of PAF.  She is on coumadin for PAF.  Other history includes mitral valve annuloplasty repair for mitral insufficiency in 2006 which has been stable and last echo 2012 with normal LV systolic function EF greater than 55% and postoperative paradoxical septal wall motion as well as some posterior wall hypokinesis. (Also with cardiac cath prior to her valve surgery she had no coronary artery disease.)  The annuloplasty repair was functioning well on the echo without any evidence of residual insufficiency and only mild increase in transmitral valve gradient with a mean gradient of 5 mm mercury at room heart rate 70 beats per minute.  She presents today for exam prior to generator change for ERI.  She denies chest pain or shortness of breath.  She did have one episode of shortness of breath but she was quite active that day on vacation and was walking a lot none since that time.  She also complains of bruising on her arms and with any bumping into anything, this is secondary to the Coumadin.  No Known Allergies  Current Outpatient Prescriptions  Medication Sig Dispense Refill  . acetaminophen (TYLENOL) 325 MG tablet Take 650 mg by mouth as needed for pain.      Marland Kitchen AMOXICILLIN PO Take by mouth as needed. SBE prophylaxis      . aspirin 81 MG tablet Take 81 mg by mouth daily.      . cholecalciferol (VITAMIN D) 400 UNITS TABS  Take 400 Units by mouth daily.      . folic acid (FOLVITE) 1 MG tablet Take 1 tablet (1 mg total) by mouth daily.  90 tablet  0  . levothyroxine (SYNTHROID, LEVOTHROID) 75 MCG tablet Take 75 mcg by mouth daily before breakfast.      . niacin 500 MG tablet Take 500 mg by mouth daily.      . Omega-3 Fatty Acids (OMEGA 3 PO) Take 1,000 mg by mouth daily.      . ramipril (ALTACE) 10 MG capsule Take 10 mg by mouth daily.      . rosuvastatin (CRESTOR) 10 MG tablet Take 10 mg by mouth daily.      . sotalol (BETAPACE) 80 MG tablet Take 80 mg by mouth 2 (two) times daily.      Marland Kitchen triamterene-hydrochlorothiazide (MAXZIDE-25) 37.5-25 MG per tablet Take 1 tablet by mouth daily. Take 1/2 tablet daily      . warfarin (COUMADIN) 5 MG tablet Take 5 mg by mouth as directed.      . cephALEXin (KEFLEX) 500 MG capsule Take 1 capsule (500 mg total) by mouth 4 (four) times daily.  20 capsule  0  . guaiFENesin (MUCINEX) 600 MG 12 hr tablet Take 2 tablets (1,200 mg total) by mouth 2 (two) times daily.  30 tablet  0   No current facility-administered medications for this visit.    Past Medical History  Diagnosis Date  . Mitral valve insufficiency     Recieved Mitral valve repair with reconstruction of the anterior leaflet cordis with cortex and ring annuloplasty  . Systemic hypertension   . Dyslipidemia   . Diabetes mellitus   . Obesity   . Atrial fibrillation     PAF---currently anticoagulated with Warfarin  . RBBB     Due the VP with the LV epicardial lead that was inserted in 2006 during the mitral repair  . Congenital heart block 08/26/2003    Diagnosed by Dr. Shonna Chock. A temp orary pacemaker was placed in 1988 during hysterectomy procedure.  . Tachy-brady syndrome   . Long QT interval     Noticed in 2012 and 2013 EKGs---patient is currently taking Sotalol and VP 100%  . Atrial flutter 02/21/2004    s/p mitral valve repair and ppm insertion  . Sick sinus syndrome   . Nonischemic cardiomyopathy  2006    EF of 35% due to AV dyssyncrony. Has since resolved per most recent ECHO.    Past Surgical History  Procedure Laterality Date  . Mitral valve annuloplasty  02/06/2004    Performed for severe MR requiring mitral valve repair with anterior leaflet reconstruction and a 26 [employer] annuloplasty ring--model: [id number], and an 80% lesion reduction (Dr. Tyrone Sage). Two LV leads were placed epicardially just in the event that biv pacing would be needed. (One of the leads had been used for during the ppm insertion recieved one week later)   . Permanent pacemaker insertion  02/13/2004    PPM implant following a mitral valve annuloplasty. An epicardial active fixation lead (implanted on 02/06/2004 during the mitral valve procedure) was used for this implant.   . Cardiovascular stress test  09/14/2003    Performed for the pt's congenital CHB that was discovered during a preoperative evaluation for a colonoscopy.  EKG demonstrated CHB with junctional escape. Nonspecific ST changes noted. Stress ECG was positive for ischemia(2 mm ST depression). Dilated LV cavity. Inability to calculate systolic function. High risk scan. This scan was followed up with a cardiac cath.  . Cardiac catheterization  10/20/2003    Minimal decrease in the LV systolic EF of 40-45% with global hyperkinesis. Moderately dilated LV. 3+ Mitral regurgitation. Underfilling of LAD which promptly improved with intracoronary NTG administration. Microvascular angina or microvascular spasm. Otherwise coronaries were normal. No intervention necessary.  . US echocardiography  09/10/2010 and1/07/2008    The last echo showed an EF of >55%; RV mildly dilated. Mild to moderately dilated LA  with borderline RA dilation as well. Impaired diastolic function. Normal RV systolic function.MVR without residual insufficiency.No Pericardial effusion.    UJW:JXBJYNW:+ colds-productive with brown mucus taking tusinex no fevers, no weight  changes Skin:no rashes or ulcers HEENT:no blurred vision, + congestion CV:see HPI PUL:see HPI GI:no diarrhea constipation or melena, no indigestion GU:no hematuria, no dysuria MS:no joint pain, no claudication Neuro:no syncope, no lightheadedness Endo:no diabetes, + thyroid disease with recent adjustment of synthroid by Dr. Renae Gloss  Who follows  PHYSICAL EXAM BP 118/70  Pulse 65  Ht 5\' 3"  (1.6 m)  Wt 221 lb (100.245 kg)  BMI 39.16 kg/m2 General:Pleasant affect, NAD Skin:Warm and dry, brisk capillary refill HEENT:normocephalic, sclera clear, mucus membranes moist Neck:supple, no JVD, no bruits  Heart:S1S2 RRR without murmur, gallup, rub or click Lungs:clear without rales, rhonchi, or wheezes GNF:AOZH, non tender, + BS, do not palpate liver spleen or masses Ext:no lower ext edema, 2+ pedal pulses, 2+ radial  pulses bilaterally Neuro:alert and oriented, MAE, follows commands, + facial symmetry  BJY:[id number] pacing with CHB underneath vs. High degree AV block.  ASSESSMENT AND PLAN H/O mitral valve repair MVRepair 02/06/04 secondary to severe mitral regurtation.  Last echo 09/10/10 without regurgitation, ring in place and stable.  EF > 55%  Long term (current) use of anticoagulants On coumadin for PAF, will hold 2 days prior to procedure then resume post procedure.  PAF (paroxysmal atrial fibrillation) Currently ventricular pacing with underlying CHB or high degree AV block  Upper respiratory infection with cough and congestion Cough is productive with brown mucus.  Have added Keflex 500 mg QID for 5 days and Musinex 1200 mg BID for 5 days.  She is taking tussinex at home for cough.  H/O cardiac catheterization, 2006, prior to valve surgery, with normal coronary arteries No chest pain  Pacemaker at end of battery life Plan to schedule generator change, currently medtronic enpulse.  Explained what to expect with gen change and complications that could occur. Will plan to  schedule PPM next week or week after, I would expect her cold will be improved by then.

## 2012-06-26 NOTE — Assessment & Plan Note (Signed)
On coumadin for PAF, will hold 2 days prior to procedure then resume post procedure.

## 2012-06-26 NOTE — Patient Instructions (Addendum)
  You are being scheduled for a pacemaker generator change out of your Medtronic Impulse pacemaker.  Please follow all pre procedure instructions.    Please stop your coumadin 2 days before your procedure.  Your procedure will be on day 3 of no coumadin.    We will draw labs for your procedure when you arrive at the hospital.  You will need to have a pre procedure chest xray; the scheduler will tell you when to have this done.  The order for the chest xray has been sent to [facility].  A prescription has been sent to your pharmacy for an antibiotic (Keflex) and mucinex to help with your upper respiratory infection.  Please take these medications until you have taken all that was ordered.

## 2012-06-26 NOTE — Assessment & Plan Note (Signed)
No chest pain

## 2012-06-26 NOTE — Assessment & Plan Note (Addendum)
Currently ventricular pacing with underlying CHB or high degree AV block

## 2012-06-26 NOTE — Assessment & Plan Note (Addendum)
Plan to schedule generator change, currently medtronic enpulse.  Explained what to expect with gen change and complications that could occur. Will plan to schedule PPM next week or week after, I would expect her cold will be improved by then.

## 2012-07-01 ENCOUNTER — Ambulatory Visit
Admission: RE | Admit: 2012-07-01 | Discharge: 2012-07-01 | Disposition: A | Discharge status: Home / Self Care | Payer: Medicare Other | Source: Ambulatory Visit | Attending: Cardiology | Admitting: Cardiology

## 2012-07-01 ENCOUNTER — Encounter (HOSPITAL_COMMUNITY): Payer: Self-pay | Admitting: Respiratory Therapy

## 2012-07-01 ENCOUNTER — Telehealth: Payer: Self-pay | Admitting: Cardiovascular Disease

## 2012-07-01 DIAGNOSIS — J069 Acute upper respiratory infection, unspecified: Secondary | ICD-10-CM

## 2012-07-01 NOTE — Telephone Encounter (Signed)
Per Nettie Elm, they cancelled the call.

## 2012-07-01 NOTE — Telephone Encounter (Signed)
Pt is there waiting-she need lab order asap!

## 2012-07-02 ENCOUNTER — Other Ambulatory Visit: Payer: Self-pay | Admitting: *Deleted

## 2012-07-02 DIAGNOSIS — Z01810 Encounter for preprocedural cardiovascular examination: Secondary | ICD-10-CM

## 2012-07-05 MED ORDER — SODIUM CHLORIDE 0.9 % IR SOLN
80.0000 mg | Status: DC
  Filled 2012-07-05: qty 2

## 2012-07-05 MED ORDER — CEFAZOLIN SODIUM-DEXTROSE 2-3 GM-% IV SOLR
2.0000 g | INTRAVENOUS | Status: AC
  Filled 2012-07-05 (×2): qty 50

## 2012-07-06 ENCOUNTER — Ambulatory Visit (HOSPITAL_COMMUNITY)
Admission: RE | Admit: 2012-07-06 | Discharge: 2012-07-06 | Disposition: A | Discharge status: Home / Self Care | Payer: Medicare Other | Source: Ambulatory Visit | Attending: Cardiovascular Disease | Admitting: Cardiovascular Disease

## 2012-07-06 ENCOUNTER — Encounter (HOSPITAL_COMMUNITY)
Admission: RE | Disposition: A | Discharge status: Home / Self Care | Payer: Self-pay | Source: Ambulatory Visit | Attending: Cardiovascular Disease

## 2012-07-06 DIAGNOSIS — Z45010 Encounter for checking and testing of cardiac pacemaker pulse generator [battery]: Secondary | ICD-10-CM

## 2012-07-06 DIAGNOSIS — I38 Endocarditis, valve unspecified: Secondary | ICD-10-CM | POA: Insufficient documentation

## 2012-07-06 DIAGNOSIS — Z45018 Encounter for adjustment and management of other part of cardiac pacemaker: Secondary | ICD-10-CM | POA: Insufficient documentation

## 2012-07-06 DIAGNOSIS — I480 Paroxysmal atrial fibrillation: Secondary | ICD-10-CM

## 2012-07-06 DIAGNOSIS — Z01810 Encounter for preprocedural cardiovascular examination: Secondary | ICD-10-CM

## 2012-07-06 DIAGNOSIS — I442 Atrioventricular block, complete: Secondary | ICD-10-CM

## 2012-07-06 HISTORY — PX: PERMANENT PACEMAKER GENERATOR CHANGE: SHX6022

## 2012-07-06 LAB — REMOTE PACEMAKER DEVICE

## 2012-07-06 LAB — BASIC METABOLIC PANEL
BUN: 22 mg/dL (ref 6–23)
Calcium: 9 mg/dL (ref 8.4–10.5)
Chloride: 107 mEq/L (ref 96–112)
Creatinine, Ser: 0.96 mg/dL (ref 0.50–1.10)
GFR calc Af Amer: 70 mL/min — ABNORMAL LOW (ref >90)

## 2012-07-06 LAB — PROTIME-INR: INR: 1.24 (ref 0.00–1.49)

## 2012-07-06 LAB — APTT: aPTT: 34 seconds (ref 24–37)

## 2012-07-06 LAB — CBC
HCT: 38 % (ref 36.0–46.0)
MCHC: 32.6 g/dL (ref 30.0–36.0)
Platelets: 243 10*3/uL (ref 150–400)
RDW: 14.5 % (ref 11.5–15.5)

## 2012-07-06 SURGERY — PERMANENT PACEMAKER GENERATOR CHANGE
Anesthesia: LOCAL

## 2012-07-06 MED ORDER — HYDROCODONE-ACETAMINOPHEN 5-325 MG PO TABS: 1.0000 | ORAL_TABLET | ORAL | Status: DC | PRN

## 2012-07-06 MED ORDER — ONDANSETRON HCL 4 MG/2ML IJ SOLN: 4.0000 mg | Freq: Four times a day (QID) | INTRAMUSCULAR | Status: DC | PRN

## 2012-07-06 MED ORDER — MIDAZOLAM HCL 5 MG/5ML IJ SOLN
INTRAMUSCULAR | Status: AC
  Filled 2012-07-06: qty 5

## 2012-07-06 MED ORDER — CHLORHEXIDINE GLUCONATE 4 % EX LIQD
60.0000 mL | Freq: Once | CUTANEOUS | Status: AC
  Administered 2012-07-06: 4 via TOPICAL
  Filled 2012-07-06: qty 60

## 2012-07-06 MED ORDER — SODIUM CHLORIDE 0.9 % IJ SOLN: 3.0000 mL | INTRAMUSCULAR | Status: DC | PRN

## 2012-07-06 MED ORDER — MUPIROCIN 2 % EX OINT
TOPICAL_OINTMENT | Freq: Two times a day (BID) | CUTANEOUS | Status: DC
  Filled 2012-07-06: qty 22

## 2012-07-06 MED ORDER — MUPIROCIN 2 % EX OINT
TOPICAL_OINTMENT | CUTANEOUS | Status: AC
  Administered 2012-07-06: 1 via NASAL
  Filled 2012-07-06: qty 22

## 2012-07-06 MED ORDER — SODIUM CHLORIDE 0.9 % IV SOLN: INTRAVENOUS | Status: DC

## 2012-07-06 MED ORDER — ACETAMINOPHEN 325 MG PO TABS: 325.0000 mg | ORAL_TABLET | ORAL | Status: DC | PRN

## 2012-07-06 MED ORDER — SODIUM CHLORIDE 0.9 % IV SOLN
INTRAVENOUS | Status: DC
  Administered 2012-07-06: 08:00:00 via INTRAVENOUS

## 2012-07-06 MED ORDER — LIDOCAINE HCL (PF) 1 % IJ SOLN
INTRAMUSCULAR | Status: AC
  Filled 2012-07-06: qty 30

## 2012-07-06 MED ORDER — HEPARIN (PORCINE) IN NACL 2-0.9 UNIT/ML-% IJ SOLN
INTRAMUSCULAR | Status: AC
  Filled 2012-07-06: qty 500

## 2012-07-06 MED ORDER — FENTANYL CITRATE 0.05 MG/ML IJ SOLN
INTRAMUSCULAR | Status: AC
Start: 2012-07-06 — End: 2012-07-06
  Filled 2012-07-06: qty 2

## 2012-07-06 MED ORDER — LIDOCAINE HCL (PF) 1 % IJ SOLN
INTRAMUSCULAR | Status: AC
  Filled 2012-07-06: qty 60

## 2012-07-06 NOTE — H&P (Signed)
Date of Initial H&P: 06/26/12 - Nada Boozer, NP  History reviewed, patient examined, no change in status, stable for surgery. 67 yo lady with valvular heart disease and high grade second degree AV block (essentially PM dependent), here for generator changeout (ERI). This procedure has been fully reviewed with the patient and written informed consent has been obtained. Thurmon Fair, MD, [facility] [phone] office [phone] pager

## 2012-07-06 NOTE — Op Note (Signed)
Procedure report  Procedure performed:  1. dual-chamber pacemaker generator changeout  2. Light sedation  3. insertion of temporary transvenous pacemaker lead Reason for procedure:  1. Device generator at elective replacement interval  2. complete heart block/pacemaker dependent Procedure performed by:  Thurmon Fair, MD  Complications:  None  Estimated blood loss:  100 mL  Medications administered during procedure:  Ancef 2 g intravenously, lidocaine 1% 30 mL locally, fentanyl 75 mcg intravenously, Versed 4 mg intravenously Device details:   Personnel officer, model number [id number], serial number [id number] Right atrial lead (chronic) Medtronic [id number], serial numberPJN [id number] (implanted February 13 2004) Right ventricular lead (chronic)  Medtronic [id number] (epicardial),  serial number  [id number] (implanted 02/13/2004)  Explanted generator Medtronic Enpulse ,  model number [id number], serial number  [id number] 02/13/2004  Of note there is a h a redundant capped potentially reusable right ventricular lead (Medtronic [id number] epicardial unipolar leads to a number [id number] Procedure details:  After the risks and benefits of the procedure were discussed the patient provided informed consent. She was brought to the cardiac catheter lab in the fasting state. The patient was prepped and draped in usual sterile fashion.   Likewise the right groin was prepped and draped in sterile fashion. Using the modified Seldinger technique a 6 French right common femoral vein sheath was introduced without difficulty. Under fluoroscopic guidance a balloontipped temporary pacemaker wire was advanced to level the right ventricular apex. After stable position and satisfactory pacing and sensing pressures were established, the lead was set to backup pacing at 40 beats per minute using an external pacemaker.  Local anesthesia with 1% lidocaine was administered to to the left infraclavicular area. A  5-6cm horizontal incision was made parallel with and 2-3 cm caudal to the left clavicle, in the area of an old scar. An older scar was seen closer to the left clavicle. Using minimal electrocautery and mostly sharp and blunt dissection the prepectoral pocket was opened carefully to avoid injury to the loops of chronic leads. Extensive dissection was  necessary. The device is located in a deep subpectoral muscular pocket . The device was explanted. The pocket was carefully inspected for hemostasis and flushed with copious amounts of antibiotic solution.  The leads were disconnected from the old generator and testing of the lead parameters later showed excellent values. The new generator was connected to the chronic leads, with appropriate pacing noted once it was introduced in the pocket. During the testing back up pacing was provided via the temporary pacemaker lead.  The entire system was then carefully inserted in the pocket with care been taking that the leads and device assumed a comfortable position without pressure on the incision. Great care was taken that the leads be located deep to the generator. The pocket was then closed in layers using 2 layers of 2-0 Vicryl and an additional layer of subcutaneous 3-0 Vicryl after which Steri-Strips and a sterile dressing placed.   At the end of the procedure the following lead parameters were encountered:   Right atrial lead sensed P waves 4.6 mV, impedance 481 ohms, threshold 0.7 at 0.5 ms pulse width.  Right ventricular lead sensed paced R waves  16.5 mV, impedance 475 ohms, threshold 0.8 at 0.5 ms pulse width.  Thurmon Fair, MD, [facility] [phone] office [phone] pager

## 2012-07-15 ENCOUNTER — Ambulatory Visit (INDEPENDENT_AMBULATORY_CARE_PROVIDER_SITE_OTHER): Payer: 59 | Admitting: Cardiovascular Disease

## 2012-07-15 ENCOUNTER — Ambulatory Visit (INDEPENDENT_AMBULATORY_CARE_PROVIDER_SITE_OTHER): Payer: 59 | Admitting: Pharmacist Clinician (PhC)/ Clinical Pharmacy Specialist

## 2012-07-15 ENCOUNTER — Encounter: Payer: Self-pay | Admitting: Cardiovascular Disease

## 2012-07-15 VITALS — BP 106/74 | HR 60 | Resp 14 | Ht 63.0 in | Wt 220.1 lb

## 2012-07-15 DIAGNOSIS — I442 Atrioventricular block, complete: Secondary | ICD-10-CM

## 2012-07-15 DIAGNOSIS — Z950 Presence of cardiac pacemaker: Secondary | ICD-10-CM

## 2012-07-15 DIAGNOSIS — I4891 Unspecified atrial fibrillation: Secondary | ICD-10-CM

## 2012-07-15 DIAGNOSIS — I480 Paroxysmal atrial fibrillation: Secondary | ICD-10-CM

## 2012-07-15 DIAGNOSIS — Z7901 Long term (current) use of anticoagulants: Secondary | ICD-10-CM

## 2012-07-15 DIAGNOSIS — Z9889 Other specified postprocedural states: Secondary | ICD-10-CM

## 2012-07-15 LAB — POCT INR: INR: 2.2

## 2012-07-15 NOTE — Patient Instructions (Addendum)
Your physician recommends that you schedule a follow-up appointment in: One year.  

## 2012-07-16 ENCOUNTER — Other Ambulatory Visit: Payer: Self-pay | Admitting: Cardiovascular Disease

## 2012-07-19 ENCOUNTER — Encounter: Payer: Self-pay | Admitting: Cardiovascular Disease

## 2012-07-19 NOTE — Progress Notes (Signed)
Patient ID: [name], female   DOB: 06-11-1945, 67 y.o.   MRN: [id number]      Reason for office visit Pacemaker generator change followup  [name] is doing well after her procedure although she is still quite sore in the left subclavian area. She had a very deep submuscular pocket. There is no evidence of hematoma or signs of infection. Her warfarin was briefly interrupted for the procedure. A temporary pacemaker wire was using generator change out since she is pacemaker dependent and the ventricular lead is a unipolar epicardial lead.  No Known Allergies  Current Outpatient Prescriptions  Medication Sig Dispense Refill  . acetaminophen (TYLENOL) 325 MG tablet Take 650 mg by mouth as needed for pain.      Marland Kitchen aspirin 81 MG tablet Take 81 mg by mouth daily.      . cholecalciferol (VITAMIN D) 400 UNITS TABS Take 400 Units by mouth daily.      . folic acid (FOLVITE) 1 MG tablet Take 1 tablet (1 mg total) by mouth daily.  90 tablet  0  . HYDROcodone-acetaminophen (NORCO/VICODIN) 5-325 MG per tablet Take 1-2 tablets by mouth every 4 (four) hours as needed.  30 tablet  0  . levothyroxine (SYNTHROID, LEVOTHROID) 75 MCG tablet Take 75 mcg by mouth daily before breakfast.      . niacin 500 MG tablet Take 500 mg by mouth daily.      . Omega-3 Fatty Acids (OMEGA 3 PO) Take 1,000 mg by mouth daily.      . rosuvastatin (CRESTOR) 10 MG tablet Take 10 mg by mouth daily.      . sotalol (BETAPACE) 80 MG tablet Take 80 mg by mouth 2 (two) times daily.      Marland Kitchen triamterene-hydrochlorothiazide (MAXZIDE-25) 37.5-25 MG per tablet Take 0.5 tablets by mouth daily.       Marland Kitchen warfarin (COUMADIN) 5 MG tablet Take 5-7.5 mg by mouth daily. Take 5mg  on Wednesday. Take 7.5mg  the rest of the week      . cephALEXin (KEFLEX) 500 MG capsule Take 1 capsule (500 mg total) by mouth 4 (four) times daily.  20 capsule  0  . guaiFENesin (MUCINEX) 600 MG 12 hr tablet Take 2 tablets (1,200 mg total) by mouth 2 (two) times daily.  30  tablet  0  . ramipril (ALTACE) 10 MG capsule TAKE 1 CAPSULE BY MOUTH EVERY NIGHT AT BEDTIME  30 capsule  5   No current facility-administered medications for this visit.    Past Medical History  Diagnosis Date  . Mitral valve insufficiency     Recieved Mitral valve repair with reconstruction of the anterior leaflet cordis with cortex and ring annuloplasty  . Systemic hypertension   . Dyslipidemia   . Diabetes mellitus   . Obesity   . Atrial fibrillation     PAF---currently anticoagulated with Warfarin  . RBBB     Due the VP with the LV epicardial lead that was inserted in 2006 during the mitral repair  . Congenital heart block 08/26/2003    Diagnosed by Dr. Shonna Chock. A temp orary pacemaker was placed in 1988 during hysterectomy procedure.  . Tachy-brady syndrome   . Long QT interval     Noticed in 2012 and 2013 EKGs---patient is currently taking Sotalol and VP 100%  . Atrial flutter 02/21/2004    s/p mitral valve repair and ppm insertion  . Sick sinus syndrome   . Nonischemic cardiomyopathy 2006    EF of 35%  due to AV dyssyncrony. Has since resolved per most recent ECHO.    Past Surgical History  Procedure Laterality Date  . Mitral valve annuloplasty  02/06/2004    Performed for severe MR requiring mitral valve repair with anterior leaflet reconstruction and a 26 [employer] annuloplasty ring--model: [id number], and an 80% lesion reduction (Dr. Tyrone Sage). Two LV leads were placed epicardially just in the event that biv pacing would be needed. (One of the leads had been used for during the ppm insertion recieved one week later)   . Permanent pacemaker insertion  02/13/2004    PPM implant following a mitral valve annuloplasty. An epicardial active fixation lead (implanted on 02/06/2004 during the mitral valve procedure) was used for this implant.   . Cardiovascular stress test  09/14/2003    Performed for the pt's congenital CHB that was discovered during a  preoperative evaluation for a colonoscopy.  EKG demonstrated CHB with junctional escape. Nonspecific ST changes noted. Stress ECG was positive for ischemia(2 mm ST depression). Dilated LV cavity. Inability to calculate systolic function. High risk scan. This scan was followed up with a cardiac cath.  . Cardiac catheterization  10/20/2003    Minimal decrease in the LV systolic EF of 40-45% with global hyperkinesis. Moderately dilated LV. 3+ Mitral regurgitation. Underfilling of LAD which promptly improved with intracoronary NTG administration. Microvascular angina or microvascular spasm. Otherwise coronaries were normal. No intervention necessary.  . US echocardiography  09/10/2010 and1/07/2008    The last echo showed an EF of >55%; RV mildly dilated. Mild to moderately dilated LA  with borderline RA dilation as well. Impaired diastolic function. Normal RV systolic function.MVR without residual insufficiency.No Pericardial effusion.  Marland Kitchen Permanent pacemaker generator change  07/06/2012    Medtronic    Family History  Problem Relation Age of Onset  . Cancer - Other Mother   . Pneumonia Father   . Asthma Brother   . Healthy Brother   . Asthma Sister   . Healthy Brother   . Healthy Brother   . Healthy Brother     History   Social History  . Marital Status: Single    Spouse Name: N/A    Number of Children: N/A  . Years of Education: N/A   Occupational History  . Not on file.   Social History Main Topics  . Smoking status: Former Smoker    Types: Cigarettes    Quit date: 06/27/2006  . Smokeless tobacco: Not on file  . Alcohol Use: No  . Drug Use: No  . Sexually Active: Not on file   Other Topics Concern  . Not on file   Social History Narrative  . No narrative on file    Review of systems: Some soreness at the pacemaker site. Also complains of tingling and discomfort in her left leg. Chest some numbness. The distribution seems to fit with the lateral femoral cutaneous  nerve.  PHYSICAL EXAM BP 106/74  Pulse 60  Resp 14  Ht 5\' 3"  (1.6 m)  Wt 220 lb 1.6 oz (99.837 kg)  BMI 39 kg/m2 The surgical site is healing very nicely. Steri-Strips are still in place. There is a very small ecchymosis. There is no evidence of hematoma discharge warmth or redness. She has not had fever   BMET    Component Value Date/Time   NA 142 07/06/2012 0741   K 4.0 07/06/2012 0741   CL 107 07/06/2012 0741   CO2 26 07/06/2012 0741   GLUCOSE 111*  07/06/2012 0741   BUN 22 07/06/2012 0741   CREATININE 0.96 07/06/2012 0741   CALCIUM 9.0 07/06/2012 0741   GFRNONAA 61* 07/06/2012 0741   GFRAA 70* 07/06/2012 0741     ASSESSMENT AND PLAN Complete heart block Pacemaker dependent  Pacemaker Normal device function status post recent generator change. The incision site is healing very nicely. Of note she has a very deep submuscular pocket and the procedure was difficult secondary to bleeding. However there is no evidence of a hematoma or any sign of infection. She is reminded about the need for wound care precautions until the skin is completely sealed. She has Steri-Strips in place.   H/O mitral valve repair Annuloplasty repair and closure of left atrial appendage in 2006 by Dr. Tyrone Sage; followup echo in 2012 shows no evidence of mitral insufficiency. The left atrium is mild moderately dilated. The left ventricle is normal in size and systolic function. Surprisingly left ventricular ejection fraction actually improved after surgery (preop 40-45%).  PAF (paroxysmal atrial fibrillation) Warfarin has been restarted.. most recent device check showed less than 0.1% atrial fibrillation burden, but she had a three-hour episode of atrial fibrillation in February and a one-hour episode in April.  Junious Silk, MD, [facility] [phone] office [phone] pager

## 2012-07-19 NOTE — Assessment & Plan Note (Signed)
Pacemaker dependent 

## 2012-07-19 NOTE — Assessment & Plan Note (Signed)
Warfarin has been restarted.. most recent device check showed less than 0.1% atrial fibrillation burden, but she had a three-hour episode of atrial fibrillation in February and a one-hour episode in April.

## 2012-07-19 NOTE — Assessment & Plan Note (Signed)
Annuloplasty repair and closure of left atrial appendage in 2006 by Dr. Tyrone Sage; followup echo in 2012 shows no evidence of mitral insufficiency. The left atrium is mild moderately dilated. The left ventricle is normal in size and systolic function. Surprisingly left ventricular ejection fraction actually improved after surgery (preop 40-45%).

## 2012-07-19 NOTE — Assessment & Plan Note (Signed)
Normal device function status post recent generator change. The incision site is healing very nicely. Of note she has a very deep submuscular pocket and the procedure was difficult secondary to bleeding. However there is no evidence of a hematoma or any sign of infection. She is reminded about the need for wound care precautions until the skin is completely sealed. She has Steri-Strips in place.

## 2012-07-21 ENCOUNTER — Telehealth: Payer: Self-pay | Admitting: *Deleted

## 2012-07-21 DIAGNOSIS — R9389 Abnormal findings on diagnostic imaging of other specified body structures: Secondary | ICD-10-CM

## 2012-07-21 NOTE — Telephone Encounter (Signed)
Message copied by Marella Bile on Tue Jul 21, 2012 11:25 AM ------      Message from: Thurmon Fair      Created: Fri Jul 17, 2012  7:07 PM       No we did not. I would schedule a follow up PA and lateral CXR in 1 month please      ----- Message -----         From: Marella Bile, RN         Sent: 07/17/2012   4:45 PM           To: Thurmon Fair, MD            When you saw Ms Kozma on 6/18 did you guys discuss her abnormal chest xray?       ------

## 2012-07-21 NOTE — Telephone Encounter (Signed)
Pt called back and I advised her that Dr C wants a repeat chest xray to follow up one done 6/5. Pt agreeable and order placed

## 2012-08-04 ENCOUNTER — Ambulatory Visit
Admission: RE | Admit: 2012-08-04 | Discharge: 2012-08-04 | Disposition: A | Discharge status: Home / Self Care | Payer: Medicare Other | Source: Ambulatory Visit | Attending: Cardiovascular Disease | Admitting: Cardiovascular Disease

## 2012-08-04 DIAGNOSIS — R9389 Abnormal findings on diagnostic imaging of other specified body structures: Secondary | ICD-10-CM

## 2012-08-08 ENCOUNTER — Other Ambulatory Visit: Payer: Self-pay | Admitting: Cardiovascular Disease

## 2012-08-12 ENCOUNTER — Ambulatory Visit (INDEPENDENT_AMBULATORY_CARE_PROVIDER_SITE_OTHER): Payer: Medicare Other | Admitting: Pharmacist Clinician (PhC)/ Clinical Pharmacy Specialist

## 2012-08-12 VITALS — BP 120/84 | HR 84

## 2012-08-12 DIAGNOSIS — I480 Paroxysmal atrial fibrillation: Secondary | ICD-10-CM

## 2012-08-12 DIAGNOSIS — Z7901 Long term (current) use of anticoagulants: Secondary | ICD-10-CM

## 2012-08-12 DIAGNOSIS — I4891 Unspecified atrial fibrillation: Secondary | ICD-10-CM

## 2012-08-12 LAB — POCT INR: INR: 2.5

## 2012-09-02 ENCOUNTER — Other Ambulatory Visit: Payer: Self-pay

## 2012-09-09 ENCOUNTER — Ambulatory Visit (INDEPENDENT_AMBULATORY_CARE_PROVIDER_SITE_OTHER): Payer: Medicare Other | Admitting: Pharmacist Clinician (PhC)/ Clinical Pharmacy Specialist

## 2012-09-09 VITALS — BP 110/72 | HR 84

## 2012-09-09 DIAGNOSIS — I480 Paroxysmal atrial fibrillation: Secondary | ICD-10-CM

## 2012-09-09 DIAGNOSIS — Z7901 Long term (current) use of anticoagulants: Secondary | ICD-10-CM

## 2012-09-09 DIAGNOSIS — I4891 Unspecified atrial fibrillation: Secondary | ICD-10-CM

## 2012-09-09 LAB — POCT INR: INR: 2.2

## 2012-10-07 ENCOUNTER — Ambulatory Visit (INDEPENDENT_AMBULATORY_CARE_PROVIDER_SITE_OTHER): Payer: 59 | Admitting: Pharmacist Clinician (PhC)/ Clinical Pharmacy Specialist

## 2012-10-07 VITALS — BP 124/80 | HR 88

## 2012-10-07 DIAGNOSIS — I480 Paroxysmal atrial fibrillation: Secondary | ICD-10-CM

## 2012-10-07 DIAGNOSIS — I4891 Unspecified atrial fibrillation: Secondary | ICD-10-CM

## 2012-10-07 DIAGNOSIS — Z7901 Long term (current) use of anticoagulants: Secondary | ICD-10-CM

## 2012-10-07 LAB — POCT INR: INR: 2.4

## 2012-10-19 ENCOUNTER — Other Ambulatory Visit: Payer: Self-pay | Admitting: Cardiovascular Disease

## 2012-10-19 DIAGNOSIS — I498 Other specified cardiac arrhythmias: Secondary | ICD-10-CM

## 2012-10-19 LAB — PACEMAKER DEVICE OBSERVATION

## 2012-10-24 ENCOUNTER — Encounter: Payer: Self-pay | Admitting: *Deleted

## 2012-10-24 LAB — REMOTE PACEMAKER DEVICE
AL AMPLITUDE: 2.8 mv
AL IMPEDENCE PM: 484 Ohm
AL THRESHOLD: 0.875 V
BATTERY VOLTAGE: 2.8 V
RV LEAD IMPEDENCE PM: 485 Ohm

## 2012-10-26 ENCOUNTER — Other Ambulatory Visit: Payer: Self-pay | Admitting: Cardiovascular Disease

## 2012-10-26 NOTE — Telephone Encounter (Signed)
Rx was sent to pharmacy electronically. 

## 2012-11-18 ENCOUNTER — Ambulatory Visit (INDEPENDENT_AMBULATORY_CARE_PROVIDER_SITE_OTHER): Payer: 59 | Admitting: Pharmacist Clinician (PhC)/ Clinical Pharmacy Specialist

## 2012-11-18 VITALS — BP 118/78 | HR 84

## 2012-11-18 DIAGNOSIS — I480 Paroxysmal atrial fibrillation: Secondary | ICD-10-CM

## 2012-11-18 DIAGNOSIS — I4891 Unspecified atrial fibrillation: Secondary | ICD-10-CM

## 2012-11-18 DIAGNOSIS — Z7901 Long term (current) use of anticoagulants: Secondary | ICD-10-CM

## 2012-12-03 ENCOUNTER — Other Ambulatory Visit: Payer: Self-pay

## 2012-12-30 ENCOUNTER — Ambulatory Visit (INDEPENDENT_AMBULATORY_CARE_PROVIDER_SITE_OTHER): Payer: 59 | Admitting: Pharmacist Clinician (PhC)/ Clinical Pharmacy Specialist

## 2012-12-30 ENCOUNTER — Ambulatory Visit (INDEPENDENT_AMBULATORY_CARE_PROVIDER_SITE_OTHER): Payer: 59

## 2012-12-30 VITALS — BP 120/82 | HR 88

## 2012-12-30 DIAGNOSIS — I4891 Unspecified atrial fibrillation: Secondary | ICD-10-CM

## 2012-12-30 DIAGNOSIS — Z7901 Long term (current) use of anticoagulants: Secondary | ICD-10-CM

## 2012-12-30 DIAGNOSIS — I442 Atrioventricular block, complete: Secondary | ICD-10-CM

## 2012-12-30 DIAGNOSIS — I480 Paroxysmal atrial fibrillation: Secondary | ICD-10-CM

## 2012-12-30 LAB — PACEMAKER DEVICE OBSERVATION

## 2012-12-30 LAB — POCT INR: INR: 2.2

## 2013-01-09 ENCOUNTER — Other Ambulatory Visit: Payer: Self-pay | Admitting: Cardiovascular Disease

## 2013-01-11 NOTE — Telephone Encounter (Signed)
Rx was sent to pharmacy electronically. 

## 2013-01-12 ENCOUNTER — Encounter: Payer: Self-pay | Admitting: *Deleted

## 2013-01-12 LAB — MDC_IDC_ENUM_SESS_TYPE_REMOTE
Brady Statistic AP VP Percent: 15.7 %
Brady Statistic AS VP Percent: 84.3 %
Brady Statistic AS VS Percent: 0.1 % — CL
Lead Channel Impedance Value: 484 Ohm
Lead Channel Pacing Threshold Amplitude: 0.75 V
Lead Channel Pacing Threshold Pulse Width: 0.4 ms
Lead Channel Pacing Threshold Pulse Width: 0.4 ms
Lead Channel Setting Pacing Amplitude: 2.75 V

## 2013-02-06 ENCOUNTER — Other Ambulatory Visit: Payer: Self-pay | Admitting: Cardiovascular Disease

## 2013-02-10 ENCOUNTER — Ambulatory Visit (INDEPENDENT_AMBULATORY_CARE_PROVIDER_SITE_OTHER): Payer: 59 | Admitting: Pharmacist Clinician (PhC)/ Clinical Pharmacy Specialist

## 2013-02-10 VITALS — BP 104/70 | HR 72

## 2013-02-10 DIAGNOSIS — I4891 Unspecified atrial fibrillation: Secondary | ICD-10-CM

## 2013-02-10 DIAGNOSIS — I480 Paroxysmal atrial fibrillation: Secondary | ICD-10-CM

## 2013-02-10 DIAGNOSIS — Z7901 Long term (current) use of anticoagulants: Secondary | ICD-10-CM

## 2013-02-10 LAB — POCT INR: INR: 2.8

## 2013-02-10 MED ORDER — ROSUVASTATIN CALCIUM 10 MG PO TABS: 10.0000 mg | ORAL_TABLET | Freq: Every day | ORAL | Status: DC

## 2013-03-14 ENCOUNTER — Other Ambulatory Visit: Payer: Self-pay | Admitting: Cardiovascular Disease

## 2013-03-15 NOTE — Telephone Encounter (Signed)
Rx was sent to pharmacy electronically. 

## 2013-03-24 ENCOUNTER — Ambulatory Visit (INDEPENDENT_AMBULATORY_CARE_PROVIDER_SITE_OTHER): Payer: 59 | Admitting: Pharmacist Clinician (PhC)/ Clinical Pharmacy Specialist

## 2013-03-24 VITALS — BP 124/80 | HR 80

## 2013-03-24 DIAGNOSIS — I4891 Unspecified atrial fibrillation: Secondary | ICD-10-CM

## 2013-03-24 DIAGNOSIS — I480 Paroxysmal atrial fibrillation: Secondary | ICD-10-CM

## 2013-03-24 DIAGNOSIS — Z7901 Long term (current) use of anticoagulants: Secondary | ICD-10-CM

## 2013-03-24 LAB — POCT INR: INR: 1.8

## 2013-04-01 ENCOUNTER — Ambulatory Visit (INDEPENDENT_AMBULATORY_CARE_PROVIDER_SITE_OTHER): Payer: 59 | Admitting: *Deleted

## 2013-04-01 ENCOUNTER — Encounter: Payer: Self-pay | Admitting: Cardiovascular Disease

## 2013-04-01 DIAGNOSIS — I442 Atrioventricular block, complete: Secondary | ICD-10-CM

## 2013-04-01 DIAGNOSIS — Z950 Presence of cardiac pacemaker: Secondary | ICD-10-CM

## 2013-04-01 LAB — MDC_IDC_ENUM_SESS_TYPE_REMOTE
Battery Impedance: 110 Ohm
Battery Remaining Longevity: 131 mo
Battery Voltage: 2.79 V
Brady Statistic AP VP Percent: 16 %
Brady Statistic AP VS Percent: 0 %
Lead Channel Impedance Value: 463 Ohm
Lead Channel Pacing Threshold Amplitude: 0.75 V
Lead Channel Pacing Threshold Amplitude: 1.25 V
Lead Channel Pacing Threshold Pulse Width: 0.4 ms
Lead Channel Setting Pacing Amplitude: 2.5 V
Lead Channel Setting Pacing Pulse Width: 0.4 ms
Lead Channel Setting Sensing Sensitivity: 4 mV
MDC IDC MSMT LEADCHNL RA PACING THRESHOLD PULSEWIDTH: 0.4 ms
MDC IDC MSMT LEADCHNL RA SENSING INTR AMPL: 2.8 mV
MDC IDC MSMT LEADCHNL RV IMPEDANCE VALUE: 503 Ohm
MDC IDC SESS DTM: 20150305154045
MDC IDC SET LEADCHNL RA PACING AMPLITUDE: 1.5 V
MDC IDC STAT BRADY AS VP PERCENT: 84 %
MDC IDC STAT BRADY AS VS PERCENT: 0 %

## 2013-05-05 ENCOUNTER — Ambulatory Visit (INDEPENDENT_AMBULATORY_CARE_PROVIDER_SITE_OTHER): Payer: 59 | Admitting: Pharmacist Clinician (PhC)/ Clinical Pharmacy Specialist

## 2013-05-05 DIAGNOSIS — Z7901 Long term (current) use of anticoagulants: Secondary | ICD-10-CM

## 2013-05-05 DIAGNOSIS — I480 Paroxysmal atrial fibrillation: Secondary | ICD-10-CM

## 2013-05-05 DIAGNOSIS — I4891 Unspecified atrial fibrillation: Secondary | ICD-10-CM

## 2013-05-05 LAB — POCT INR: INR: 2.2

## 2013-05-27 ENCOUNTER — Encounter: Payer: Self-pay | Admitting: *Deleted

## 2013-06-16 ENCOUNTER — Ambulatory Visit (INDEPENDENT_AMBULATORY_CARE_PROVIDER_SITE_OTHER): Payer: 59 | Admitting: Pharmacist Clinician (PhC)/ Clinical Pharmacy Specialist

## 2013-06-16 DIAGNOSIS — I480 Paroxysmal atrial fibrillation: Secondary | ICD-10-CM

## 2013-06-16 DIAGNOSIS — I4891 Unspecified atrial fibrillation: Secondary | ICD-10-CM

## 2013-06-16 DIAGNOSIS — Z7901 Long term (current) use of anticoagulants: Secondary | ICD-10-CM

## 2013-06-16 LAB — POCT INR: INR: 2.2

## 2013-07-28 ENCOUNTER — Ambulatory Visit (INDEPENDENT_AMBULATORY_CARE_PROVIDER_SITE_OTHER): Payer: 59 | Admitting: Pharmacist Clinician (PhC)/ Clinical Pharmacy Specialist

## 2013-07-28 DIAGNOSIS — I4891 Unspecified atrial fibrillation: Secondary | ICD-10-CM

## 2013-07-28 DIAGNOSIS — Z7901 Long term (current) use of anticoagulants: Secondary | ICD-10-CM

## 2013-07-28 DIAGNOSIS — I480 Paroxysmal atrial fibrillation: Secondary | ICD-10-CM

## 2013-07-28 LAB — POCT INR: INR: 1.7

## 2013-08-17 ENCOUNTER — Other Ambulatory Visit: Payer: Self-pay | Admitting: Cardiovascular Disease

## 2013-08-17 NOTE — Telephone Encounter (Signed)
Rx was sent to pharmacy electronically. 

## 2013-08-27 IMAGING — CR DG CHEST 2V
2 series · 2 of 2 positions shown · non-contrast
Comparison: 07/01/2012

CLINICAL DATA: Follow-up abnormal chest x-ray from 07/02/2012.
Recent pacemaker exchanged.  History of valve repair in 5110.
History of smoking.

CHEST - 2 VIEW

[view not recorded (1 of 2)]
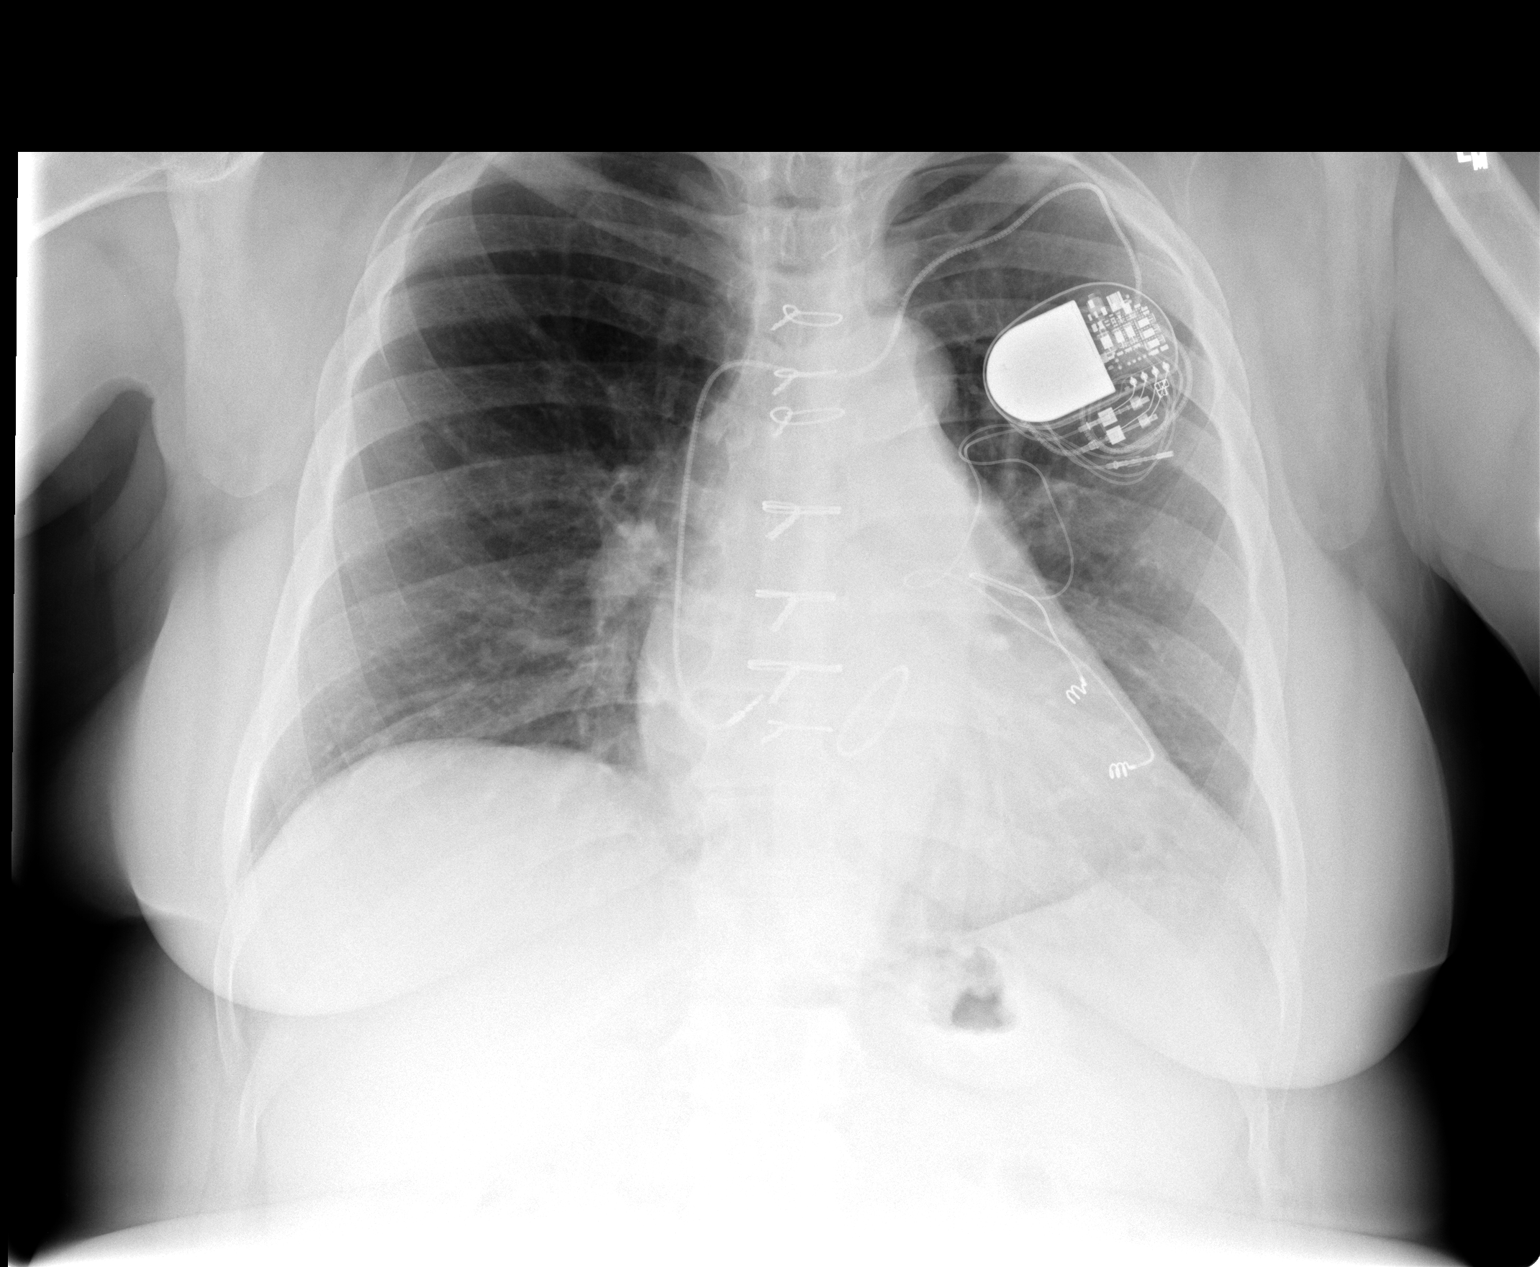

[view not recorded (2 of 2)]
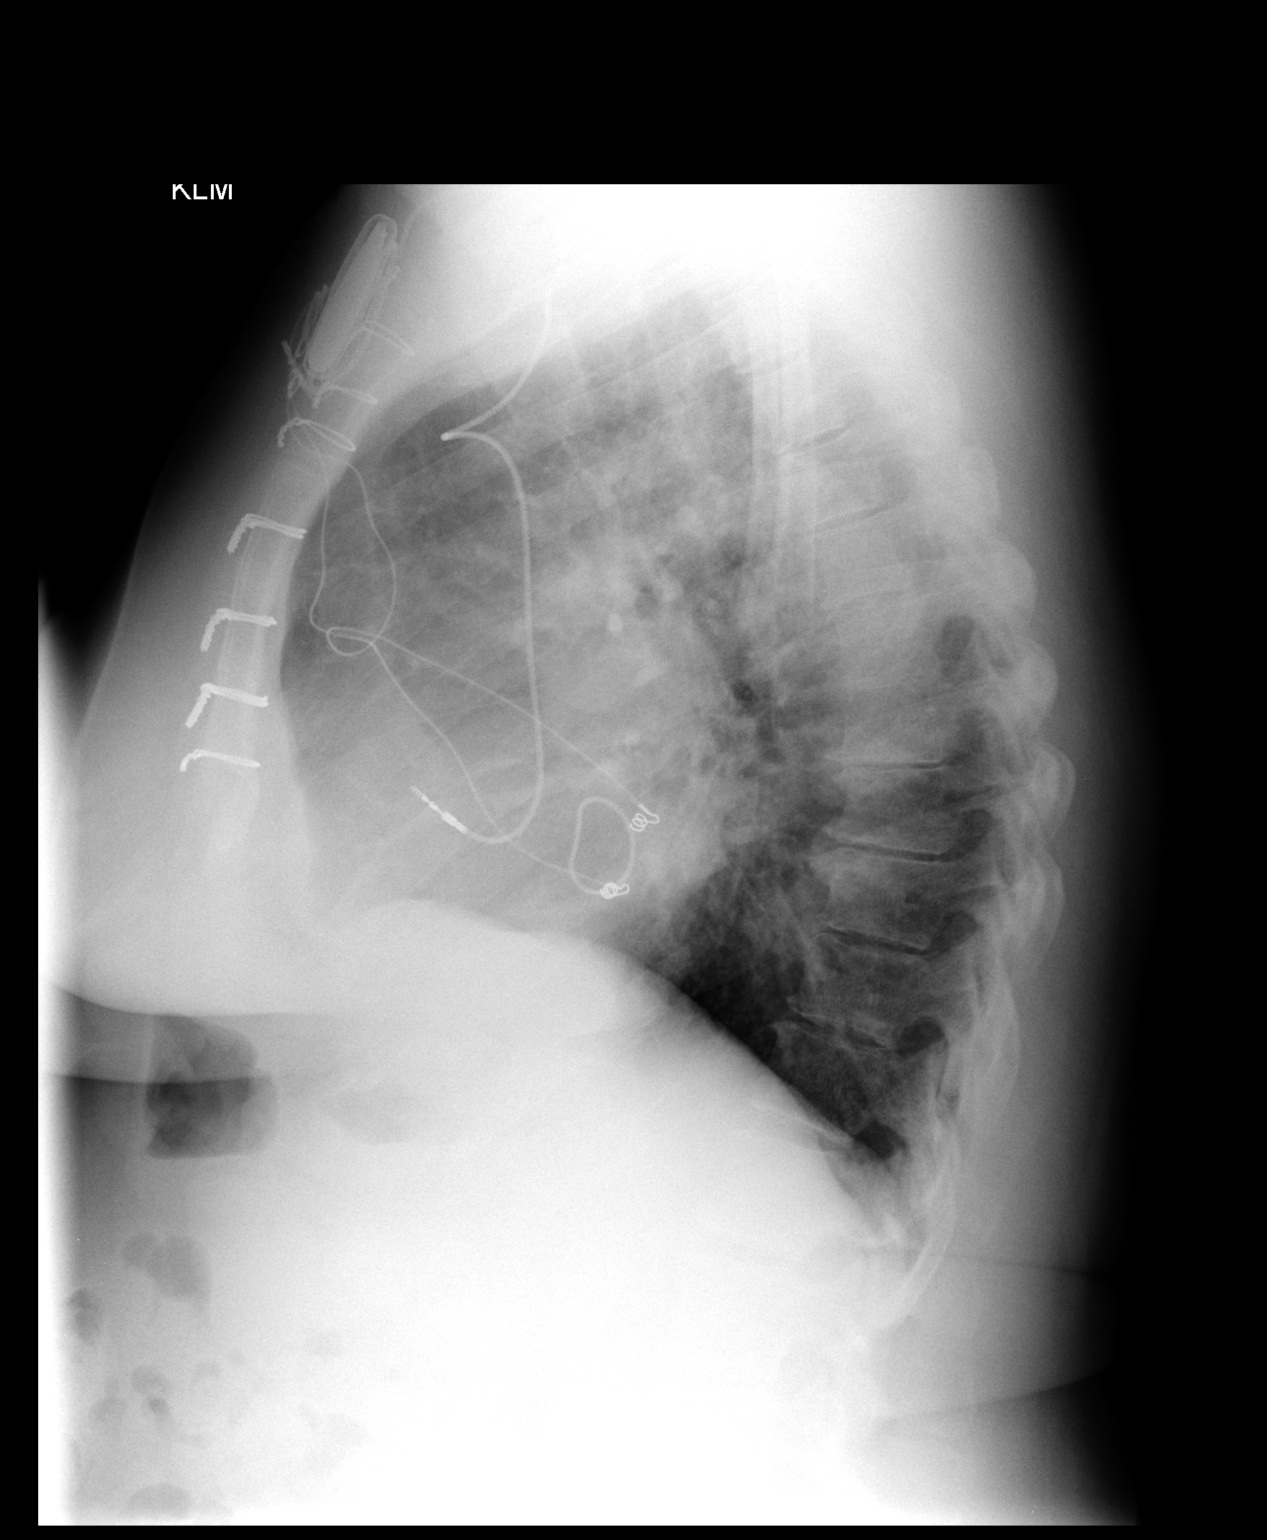

[2 of 2 positions shown; findings below may reference images not displayed]

FINDINGS: Patient has left-sided transvenous pacemaker with lead to
the right atrium.  Patient has had previous valve replacement and
median sternotomy.  Heart is enlarged.  Nodular density previously
projecting over the left lung base is not apparent on the current
study.  There are no focal consolidations or pleural effusions.
Degenerative changes are seen in the mid thoracic spine.
IMPRESSION: 1.  Cardiomegaly without pulmonary edema.
2.  No evidence for acute cardiopulmonary abnormality.

## 2013-09-14 ENCOUNTER — Encounter: Payer: Self-pay | Admitting: Cardiovascular Disease

## 2013-09-14 ENCOUNTER — Ambulatory Visit: Payer: 59 | Admitting: Pharmacist Clinician (PhC)/ Clinical Pharmacy Specialist

## 2013-09-14 ENCOUNTER — Ambulatory Visit (INDEPENDENT_AMBULATORY_CARE_PROVIDER_SITE_OTHER): Payer: 59 | Admitting: *Deleted

## 2013-09-14 ENCOUNTER — Ambulatory Visit (INDEPENDENT_AMBULATORY_CARE_PROVIDER_SITE_OTHER): Payer: 59 | Admitting: Cardiovascular Disease

## 2013-09-14 VITALS — BP 122/76 | HR 66 | Resp 18 | Ht 63.5 in | Wt 221.7 lb

## 2013-09-14 DIAGNOSIS — I442 Atrioventricular block, complete: Secondary | ICD-10-CM

## 2013-09-14 DIAGNOSIS — I4891 Unspecified atrial fibrillation: Secondary | ICD-10-CM

## 2013-09-14 DIAGNOSIS — R0609 Other forms of dyspnea: Secondary | ICD-10-CM

## 2013-09-14 DIAGNOSIS — I480 Paroxysmal atrial fibrillation: Secondary | ICD-10-CM

## 2013-09-14 DIAGNOSIS — I340 Nonrheumatic mitral (valve) insufficiency: Secondary | ICD-10-CM

## 2013-09-14 DIAGNOSIS — I059 Rheumatic mitral valve disease, unspecified: Secondary | ICD-10-CM

## 2013-09-14 DIAGNOSIS — R0989 Other specified symptoms and signs involving the circulatory and respiratory systems: Secondary | ICD-10-CM

## 2013-09-14 DIAGNOSIS — Z7901 Long term (current) use of anticoagulants: Secondary | ICD-10-CM

## 2013-09-14 LAB — MDC_IDC_ENUM_SESS_TYPE_INCLINIC
Battery Impedance: 135 Ohm
Battery Remaining Longevity: 116 mo
Battery Voltage: 2.8 V
Brady Statistic AP VP Percent: 15 %
Brady Statistic AP VS Percent: 0 %
Brady Statistic AS VP Percent: 85 %
Brady Statistic AS VS Percent: 0 %
Lead Channel Impedance Value: 500 Ohm
Lead Channel Pacing Threshold Amplitude: 0.5 V
Lead Channel Pacing Threshold Amplitude: 0.75 V
Lead Channel Pacing Threshold Pulse Width: 0.4 ms
Lead Channel Sensing Intrinsic Amplitude: 11.2 mV
Lead Channel Setting Pacing Amplitude: 2 V
Lead Channel Setting Pacing Amplitude: 2.5 V
Lead Channel Setting Pacing Pulse Width: 0.4 ms
MDC IDC MSMT LEADCHNL RA IMPEDANCE VALUE: 450 Ohm
MDC IDC MSMT LEADCHNL RA SENSING INTR AMPL: 4 mV
MDC IDC MSMT LEADCHNL RV PACING THRESHOLD PULSEWIDTH: 0.4 ms
MDC IDC SESS DTM: 20150818125618
MDC IDC SET LEADCHNL RV SENSING SENSITIVITY: 4 mV

## 2013-09-14 LAB — POCT INR: INR: 4

## 2013-09-14 NOTE — Patient Instructions (Addendum)
Your physician has requested that you have an echocardiogram. Echocardiography is a painless test that uses sound waves to create images of your heart. It provides your doctor with information about the size and shape of your heart and how well your heart's chambers and valves are working. This procedure takes approximately one hour. There are no restrictions for this procedure.    Remote monitoring is used to monitor your pacemaker from home. This monitoring reduces the number of office visits required to check your device to one time per year. It allows [place] to keep an eye on the functioning of your device to ensure it is working properly. You are scheduled for a device check from home on 12-16-2013. You may send your transmission at any time that day. If you have a wireless device, the transmission will be sent automatically. After your physician reviews your transmission, you will receive a postcard with your next transmission date.  Your physician recommends that you schedule a follow-up appointment in: 12 months with Dr.Croitoru

## 2013-09-15 ENCOUNTER — Ambulatory Visit: Payer: 59

## 2013-09-16 ENCOUNTER — Encounter: Payer: Self-pay | Admitting: Cardiovascular Disease

## 2013-09-16 DIAGNOSIS — R0609 Other forms of dyspnea: Secondary | ICD-10-CM | POA: Insufficient documentation

## 2013-09-16 NOTE — Progress Notes (Signed)
Patient ID: [name], female   DOB: [birth year], 68 y.o.   MRN: [id number]      Reason for office visit Valvular heart disease (status post mitral valve annuloplasty), sinus node dysfunction, high-grade AV block, paroxysmal atrial fibrillation, pacemaker followup  [name] had mitral valve annuloplasty for severe mitral insufficiency in 2006. She did not have coronary disease by preoperative angiography and has normal left ventricular systolic function. Followup echocardiography has not shown evidence of residual mitral insufficiency and she has only mildly elevated transvalvular mitral gradients. 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 The right atrial lead is a conventional transvenous lead. The ventricular lead is an active fixation epicardial lead on the surface of the left ventricle placed at the time of her surgery. A generator change was performed in 2014. Her pacemaker is located in a deep subpectoral pocket. She has paroxysmal atrial fibrillation. She has treated hypertension, dyslipidemia and type 2 diabetes mellitus on the background of severe obesity.  She has had some dyspnea on exertion. She is due to have a colonoscopy tomorrow. Her anticoagulant was not stopped since it is unlikely she will require biopsy.  Interrogation of her pacemaker today shows normal function. She has only 15% atrial pacing with a good heart rate distribution. She has complete heart block with a very slow ventricular escape rhythm at 30 beats per minute. She has 100% ventricular pacing. Her only lengthy episode of atrial fibrillation in the last 6 months occurred in April and lasted for 5-1/2 hours. Most of the other events have lasted for a few minutes. Atrial fibrillation burden overall is 0.4%. Ventricular rate control has been satisfactory. She has not had meaningful ventricular arrhythmia.   No Known  Allergies  Current Outpatient Prescriptions  Medication Sig Dispense Refill  . acetaminophen (TYLENOL) 325 MG tablet Take 650 mg by mouth as needed for pain.      Marland Kitchen aspirin 81 MG tablet Take 81 mg by mouth daily.      . cholecalciferol (VITAMIN D) 400 UNITS TABS Take 400 Units by mouth daily.      . folic acid (FOLVITE) 1 MG tablet TAKE 1 TABLET BY MOUTH DAILY  90 tablet  3  . HYDROcodone-acetaminophen (NORCO/VICODIN) 5-325 MG per tablet Take 1-2 tablets by mouth every 4 (four) hours as needed.  30 tablet  0  . levothyroxine (SYNTHROID, LEVOTHROID) 75 MCG tablet Take 75 mcg by mouth daily before breakfast.      . Multiple Vitamins-Minerals (CENTRUM) tablet Take 1 tablet by mouth daily.      . niacin 500 MG tablet Take 500 mg by mouth daily.      . Omega-3 Fatty Acids (OMEGA 3 PO) Take 1,000 mg by mouth daily.      . ramipril (ALTACE) 10 MG capsule TAKE ONE CAPSULE BY MOUTH EVERY NIGHT AT BEDTIME  30 capsule  1  . rosuvastatin (CRESTOR) 10 MG tablet Take 1 tablet (10 mg total) by mouth daily.  28 tablet  0  . sotalol (BETAPACE) 80 MG tablet TAKE 1 TABLET BY MOUTH TWICE DAILY  60 tablet  5  . triamterene-hydrochlorothiazide (MAXZIDE-25) 37.5-25 MG per tablet TAKE 1/2 TABLET BY MOUTH AS DIRECTED  15 tablet  5  . warfarin (COUMADIN) 5 MG tablet TAKE 1 1/2 TABLETS BY MOUTH DAILY AS DIRECTED  135 tablet  1   No current facility-administered medications for this visit.    Past Medical  History  Diagnosis Date  . Mitral valve insufficiency     Recieved Mitral valve repair with reconstruction of the anterior leaflet cordis with cortex and ring annuloplasty  . Systemic hypertension   . Dyslipidemia   . Diabetes mellitus   . Obesity   . Atrial fibrillation     PAF---currently anticoagulated with Warfarin  . RBBB     Due the VP with the LV epicardial lead that was inserted in 2006 during the mitral repair  . Congenital heart block 08/26/2003    Diagnosed by [name]. A temp orary  pacemaker was placed in 1988 during hysterectomy procedure.  . Tachy-brady syndrome   . Long QT interval     Noticed in 2012 and 2013 EKGs---patient is currently taking Sotalol and VP 100%  . Atrial flutter 02/21/2004    s/p mitral valve repair and ppm insertion  . Sick sinus syndrome   . Nonischemic cardiomyopathy 2006    EF of 35% due to AV dyssyncrony. Has since resolved per most recent ECHO.    Past Surgical History  Procedure Laterality Date  . Mitral valve annuloplasty  02/06/2004    Performed for severe MR requiring mitral valve repair with anterior leaflet reconstruction and a [place] annuloplasty ring--model: [id number], and an 80% lesion reduction ([name]). Two LV leads were placed epicardially just in the event that biv pacing would be needed. (One of the leads had been used for during the ppm insertion recieved one week later)   . Permanent pacemaker insertion  02/13/2004    PPM implant following a mitral valve annuloplasty. An epicardial active fixation lead (implanted on 02/06/2004 during the mitral valve procedure) was used for this implant.   . Cardiovascular stress test  09/14/2003    Performed for the pt's congenital CHB that was discovered during a preoperative evaluation for a colonoscopy.  EKG demonstrated CHB with junctional escape. Nonspecific ST changes noted. Stress ECG was positive for ischemia(2 mm ST depression). Dilated LV cavity. Inability to calculate systolic function. High risk scan. This scan was followed up with a cardiac cath.  . Cardiac catheterization  10/20/2003    Minimal decrease in the LV systolic EF of 81-44% with global hyperkinesis. Moderately dilated LV. 3+ Mitral regurgitation. Underfilling of LAD which promptly improved with intracoronary NTG administration. Microvascular angina or microvascular spasm. Otherwise coronaries were normal. No intervention necessary.  . US echocardiography  09/10/2010 and1/07/2008    The last echo  showed an EF of >55%; RV mildly dilated. Mild to moderately dilated LA  with borderline RA dilation as well. Impaired diastolic function. Normal RV systolic function.MVR without residual insufficiency.No Pericardial effusion.  Marland Kitchen Permanent pacemaker generator change  07/06/2012    Medtronic    Family History  Problem Relation Age of Onset  . Cancer - Other Mother   . Pneumonia Father   . Asthma Brother   . Healthy Brother   . Asthma Sister   . Healthy Brother   . Healthy Brother   . Healthy Brother     History   Social History  . Marital Status: Single    Spouse Name: N/A    Number of Children: N/A  . Years of Education: N/A   Occupational History  . Not on file.   Social History Main Topics  . Smoking status: Former Smoker    Types: Cigarettes    Quit date: 06/27/2006  . Smokeless tobacco: Not on file  . Alcohol Use: No  .  Drug Use: No  . Sexual Activity: Not on file   Other Topics Concern  . Not on file   Social History Narrative  . No narrative on file    Review of systems: The patient specifically denies any chest pain at rest or with exertion, dyspnea at rest or with exertion, orthopnea, paroxysmal nocturnal dyspnea, syncope, palpitations, focal neurological deficits, intermittent claudication, lower extremity edema, unexplained weight gain, cough, hemoptysis or wheezing.  The patient also denies abdominal pain, nausea, vomiting, dysphagia, diarrhea, constipation, polyuria, polydipsia, dysuria, hematuria, frequency, urgency, abnormal bleeding or bruising, fever, chills, unexpected weight changes, mood swings, change in skin or hair texture, change in voice quality, auditory or visual problems, allergic reactions or rashes, new musculoskeletal complaints other than usual "aches and pains".   PHYSICAL EXAM BP 122/76  Pulse 66  Resp 18  Ht 5' 3.5" (1.613 m)  Wt 221 lb 11.2 oz (100.562 kg)  BMI 38.65 kg/m2  General: Alert, oriented x3, no distress Head: no  evidence of trauma, PERRL, EOMI, no exophtalmos or lid lag, no myxedema, no xanthelasma; normal ears, nose and oropharynx Neck: normal jugular venous pulsations and no hepatojugular reflux; brisk carotid pulses without delay and no carotid bruits Chest: clear to auscultation, no signs of consolidation by percussion or palpation, normal fremitus, symmetrical and full respiratory excursions, healthy pacemaker site Cardiovascular: normal position and quality of the apical impulse, regular rhythm, normal first and second heart sounds, no murmurs, rubs or gallops Abdomen: no tenderness or distention, no masses by palpation, no abnormal pulsatility or arterial bruits, normal bowel sounds, no hepatosplenomegaly Extremities: no clubbing, cyanosis or edema; 2+ radial, ulnar and brachial pulses bilaterally; 2+ right femoral, posterior tibial and dorsalis pedis pulses; 2+ left femoral, posterior tibial and dorsalis pedis pulses; no subclavian or femoral bruits Neurological: grossly nonfocal   EKG: Atrial sensed ventricular paced. Her paced QRS complex is unusual with a right bundle branch block posterior fascicular block pattern (LV epicardial ventricular lead, no RV lead).  Lipid Panel  No results found for this basename: chol, trig, hdl, cholhdl, vldl, ldlcalc    BMET    Component Value Date/Time   NA 142 07/06/2012 0741   K 4.0 07/06/2012 0741   CL 107 07/06/2012 0741   CO2 26 07/06/2012 0741   GLUCOSE 111* 07/06/2012 0741   BUN 22 07/06/2012 0741   CREATININE 0.96 07/06/2012 0741   CALCIUM 9.0 07/06/2012 0741   GFRNONAA 61* 07/06/2012 0741   GFRAA 70* 07/06/2012 0741     ASSESSMENT AND PLAN Complete heart block  Pacemaker dependent   Pacemaker  Normal device function status post recent generator change.  H/O mitral valve repair  Annuloplasty repair and closure of left atrial appendage in 2006 by [name]; followup echo in 2012 shows no evidence of mitral insufficiency. The left atrium is mild  moderately dilated. The left ventricle is normal in size and systolic function. Surprisingly left ventricular ejection fraction actually improved after surgery (preop 40-45%). No overt congestive heart failure.   PAF (paroxysmal atrial fibrillation)  Warfarin anticoagulation. Low (0.4%) atrial fibrillation burden, but she has sporadic episodes lasting for several hours. Embolic risk is relatively high with history of valvular heart disease and abnormal left ventricular systolic function.  Exertional dyspnea Reevaluate LV function and MV function by echo.  Orders Placed This Encounter  Procedures  . Implantable device check  . EKG 12-Lead  . 2D Echocardiogram without contrast   No orders of the defined types were placed in  this encounter.    Holli Humbles, MD, [place] [phone] office [phone] pager

## 2013-09-20 ENCOUNTER — Other Ambulatory Visit: Payer: Self-pay | Admitting: Cardiovascular Disease

## 2013-09-20 NOTE — Telephone Encounter (Signed)
Rx was sent to pharmacy electronically. 

## 2013-09-29 ENCOUNTER — Ambulatory Visit (HOSPITAL_COMMUNITY)
Admission: RE | Admit: 2013-09-29 | Discharge: 2013-09-29 | Disposition: A | Discharge status: Home / Self Care | Payer: Medicare Other | Source: Ambulatory Visit | Attending: Cardiovascular Disease | Admitting: Cardiovascular Disease

## 2013-09-29 ENCOUNTER — Ambulatory Visit (INDEPENDENT_AMBULATORY_CARE_PROVIDER_SITE_OTHER): Payer: 59 | Admitting: Pharmacist Clinician (PhC)/ Clinical Pharmacy Specialist

## 2013-09-29 DIAGNOSIS — E785 Hyperlipidemia, unspecified: Secondary | ICD-10-CM | POA: Insufficient documentation

## 2013-09-29 DIAGNOSIS — I4510 Unspecified right bundle-branch block: Secondary | ICD-10-CM | POA: Diagnosis not present

## 2013-09-29 DIAGNOSIS — Z87891 Personal history of nicotine dependence: Secondary | ICD-10-CM | POA: Diagnosis not present

## 2013-09-29 DIAGNOSIS — I428 Other cardiomyopathies: Secondary | ICD-10-CM | POA: Insufficient documentation

## 2013-09-29 DIAGNOSIS — I10 Essential (primary) hypertension: Secondary | ICD-10-CM | POA: Insufficient documentation

## 2013-09-29 DIAGNOSIS — I495 Sick sinus syndrome: Secondary | ICD-10-CM | POA: Diagnosis not present

## 2013-09-29 DIAGNOSIS — I4891 Unspecified atrial fibrillation: Secondary | ICD-10-CM | POA: Diagnosis not present

## 2013-09-29 DIAGNOSIS — I340 Nonrheumatic mitral (valve) insufficiency: Secondary | ICD-10-CM

## 2013-09-29 DIAGNOSIS — R0989 Other specified symptoms and signs involving the circulatory and respiratory systems: Secondary | ICD-10-CM | POA: Insufficient documentation

## 2013-09-29 DIAGNOSIS — I480 Paroxysmal atrial fibrillation: Secondary | ICD-10-CM

## 2013-09-29 DIAGNOSIS — I059 Rheumatic mitral valve disease, unspecified: Secondary | ICD-10-CM | POA: Diagnosis not present

## 2013-09-29 DIAGNOSIS — E119 Type 2 diabetes mellitus without complications: Secondary | ICD-10-CM | POA: Insufficient documentation

## 2013-09-29 DIAGNOSIS — Z7901 Long term (current) use of anticoagulants: Secondary | ICD-10-CM

## 2013-09-29 DIAGNOSIS — Z954 Presence of other heart-valve replacement: Secondary | ICD-10-CM | POA: Diagnosis not present

## 2013-09-29 DIAGNOSIS — Z9889 Other specified postprocedural states: Secondary | ICD-10-CM

## 2013-09-29 DIAGNOSIS — I359 Nonrheumatic aortic valve disorder, unspecified: Secondary | ICD-10-CM | POA: Diagnosis not present

## 2013-09-29 DIAGNOSIS — R0609 Other forms of dyspnea: Secondary | ICD-10-CM | POA: Diagnosis not present

## 2013-09-29 LAB — POCT INR: INR: 4.9

## 2013-09-29 NOTE — Progress Notes (Signed)
2D Echocardiogram Complete.  09/29/2013   Neysa Arts [place], RDCS

## 2013-10-05 ENCOUNTER — Encounter (HOSPITAL_COMMUNITY): Payer: Self-pay | Admitting: Emergency Medicine

## 2013-10-05 ENCOUNTER — Emergency Department (HOSPITAL_COMMUNITY): Payer: Medicare Other

## 2013-10-05 ENCOUNTER — Emergency Department (HOSPITAL_COMMUNITY)
Admission: EM | Admit: 2013-10-05 | Discharge: 2013-10-05 | Disposition: A | Discharge status: Home / Self Care | Payer: Medicare Other | Attending: Emergency Medicine | Admitting: Emergency Medicine

## 2013-10-05 DIAGNOSIS — Z7901 Long term (current) use of anticoagulants: Secondary | ICD-10-CM | POA: Insufficient documentation

## 2013-10-05 DIAGNOSIS — Z79899 Other long term (current) drug therapy: Secondary | ICD-10-CM | POA: Diagnosis not present

## 2013-10-05 DIAGNOSIS — Y9389 Activity, other specified: Secondary | ICD-10-CM | POA: Insufficient documentation

## 2013-10-05 DIAGNOSIS — E669 Obesity, unspecified: Secondary | ICD-10-CM | POA: Diagnosis not present

## 2013-10-05 DIAGNOSIS — I10 Essential (primary) hypertension: Secondary | ICD-10-CM | POA: Diagnosis not present

## 2013-10-05 DIAGNOSIS — S0003XA Contusion of scalp, initial encounter: Secondary | ICD-10-CM | POA: Insufficient documentation

## 2013-10-05 DIAGNOSIS — E119 Type 2 diabetes mellitus without complications: Secondary | ICD-10-CM | POA: Insufficient documentation

## 2013-10-05 DIAGNOSIS — Z9889 Other specified postprocedural states: Secondary | ICD-10-CM | POA: Diagnosis not present

## 2013-10-05 DIAGNOSIS — Z8774 Personal history of (corrected) congenital malformations of heart and circulatory system: Secondary | ICD-10-CM | POA: Diagnosis not present

## 2013-10-05 DIAGNOSIS — E785 Hyperlipidemia, unspecified: Secondary | ICD-10-CM | POA: Insufficient documentation

## 2013-10-05 DIAGNOSIS — Z7982 Long term (current) use of aspirin: Secondary | ICD-10-CM | POA: Insufficient documentation

## 2013-10-05 DIAGNOSIS — Y92410 Unspecified street and highway as the place of occurrence of the external cause: Secondary | ICD-10-CM | POA: Insufficient documentation

## 2013-10-05 DIAGNOSIS — W19XXXA Unspecified fall, initial encounter: Secondary | ICD-10-CM

## 2013-10-05 DIAGNOSIS — I4892 Unspecified atrial flutter: Secondary | ICD-10-CM | POA: Diagnosis not present

## 2013-10-05 DIAGNOSIS — I059 Rheumatic mitral valve disease, unspecified: Secondary | ICD-10-CM | POA: Diagnosis not present

## 2013-10-05 DIAGNOSIS — S0990XA Unspecified injury of head, initial encounter: Secondary | ICD-10-CM

## 2013-10-05 DIAGNOSIS — Z87891 Personal history of nicotine dependence: Secondary | ICD-10-CM | POA: Diagnosis not present

## 2013-10-05 DIAGNOSIS — Z950 Presence of cardiac pacemaker: Secondary | ICD-10-CM | POA: Diagnosis not present

## 2013-10-05 DIAGNOSIS — S0083XA Contusion of other part of head, initial encounter: Principal | ICD-10-CM | POA: Insufficient documentation

## 2013-10-05 DIAGNOSIS — I4891 Unspecified atrial fibrillation: Secondary | ICD-10-CM | POA: Insufficient documentation

## 2013-10-05 DIAGNOSIS — S1093XA Contusion of unspecified part of neck, initial encounter: Principal | ICD-10-CM

## 2013-10-05 LAB — PROTIME-INR
INR: 1.95 — AB (ref 0.00–1.49)
PROTHROMBIN TIME: 22.2 s — AB (ref 11.6–15.2)

## 2013-10-05 NOTE — ED Notes (Signed)
Pt reports fall while getting in the truck, fell backwards hitting the back of her head on the concrete.  Denies LOC.  Pt is on coumadin.

## 2013-10-05 NOTE — Discharge Instructions (Signed)

## 2013-10-05 NOTE — ED Provider Notes (Signed)
CSN: [id number]     Arrival date & time 10/05/13  2007 History   First MD Initiated Contact with Patient 10/05/13 2120     Chief Complaint  Patient presents with  . Fall  . Head Injury     (Consider location/radiation/quality/duration/timing/severity/associated sxs/prior Treatment) Patient is a 68 y.o. female presenting with fall and head injury. The history is provided by the patient.  Fall This is a new problem. Pertinent negatives include no chest pain, no abdominal pain, no headaches and no shortness of breath.  Head Injury Associated symptoms: no headaches, no nausea, no numbness and no vomiting    patient with fall. States she is trying to get into a truck and fell backwards, striking her head. No loss of conscious. No other injury. She states she has a "goose egg" she is on Coumadin for atrial fibrillation. INR 2 weeks ago was 4.9. A chest pain. No trouble breathing. No neck pain. No numbness or weakness.  Past Medical History  Diagnosis Date  . Mitral valve insufficiency     Recieved Mitral valve repair with reconstruction of the anterior leaflet cordis with cortex and ring annuloplasty  . Systemic hypertension   . Dyslipidemia   . Diabetes mellitus   . Obesity   . Atrial fibrillation     PAF---currently anticoagulated with Warfarin  . RBBB     Due the VP with the LV epicardial lead that was inserted in 2006 during the mitral repair  . Congenital heart block 08/26/2003    Diagnosed by Dr. Clovis Fredrickson. A temp orary pacemaker was placed in 1988 during hysterectomy procedure.  . Tachy-brady syndrome   . Long QT interval     Noticed in 2012 and 2013 EKGs---patient is currently taking Sotalol and VP 100%  . Atrial flutter 02/21/2004    s/p mitral valve repair and ppm insertion  . Sick sinus syndrome   . Nonischemic cardiomyopathy 2006    EF of 35% due to AV dyssyncrony. Has since resolved per most recent ECHO.   Past Surgical History  Procedure Laterality Date  . Mitral  valve annuloplasty  02/06/2004    Performed for severe MR requiring mitral valve repair with anterior leaflet reconstruction and a [place] annuloplasty ring--model: [id number], and an 80% lesion reduction (Dr. Servando Snare). Two LV leads were placed epicardially just in the event that biv pacing would be needed. (One of the leads had been used for during the ppm insertion recieved one week later)   . Permanent pacemaker insertion  02/13/2004    PPM implant following a mitral valve annuloplasty. An epicardial active fixation lead (implanted on 02/06/2004 during the mitral valve procedure) was used for this implant.   . Cardiovascular stress test  09/14/2003    Performed for the pt's congenital CHB that was discovered during a preoperative evaluation for a colonoscopy.  EKG demonstrated CHB with junctional escape. Nonspecific ST changes noted. Stress ECG was positive for ischemia(2 mm ST depression). Dilated LV cavity. Inability to calculate systolic function. High risk scan. This scan was followed up with a cardiac cath.  . Cardiac catheterization  10/20/2003    Minimal decrease in the LV systolic EF of 25-05% with global hyperkinesis. Moderately dilated LV. 3+ Mitral regurgitation. Underfilling of LAD which promptly improved with intracoronary NTG administration. Microvascular angina or microvascular spasm. Otherwise coronaries were normal. No intervention necessary.  . US echocardiography  09/10/2010 and1/07/2008    The last echo showed an EF of >55%; RV mildly  dilated. Mild to moderately dilated LA  with borderline RA dilation as well. Impaired diastolic function. Normal RV systolic function.MVR without residual insufficiency.No Pericardial effusion.  Marland Kitchen Permanent pacemaker generator change  07/06/2012    [employer]tronic   Family History  Problem Relation Age of Onset  . Cancer - Other Mother   . Pneumonia Father   . Asthma Brother   . Healthy Brother   . Asthma Sister   . Healthy Brother   .  Healthy Brother   . Healthy Brother    History  Substance Use Topics  . Smoking status: Former Smoker    Types: Cigarettes    Quit date: 06/27/2006  . Smokeless tobacco: Not on file  . Alcohol Use: No   OB History   Grav Para Term Preterm Abortions TAB SAB Ect Mult Living                 Review of Systems  Constitutional: Negative for activity change and appetite change.  Eyes: Negative for pain.  Respiratory: Negative for chest tightness and shortness of breath.   Cardiovascular: Negative for chest pain and leg swelling.  Gastrointestinal: Negative for nausea, vomiting, abdominal pain and diarrhea.  Genitourinary: Negative for flank pain.  Musculoskeletal: Negative for back pain and neck stiffness.  Skin: Negative for rash.  Neurological: Negative for weakness, numbness and headaches.  Hematological: Bruises/bleeds easily.  Psychiatric/Behavioral: Negative for behavioral problems.      Allergies  Review of patient's allergies indicates no known allergies.  Home Medications   Prior to Admission medications   Medication Sig Start Date End Date Taking? Authorizing Provider  acetaminophen (TYLENOL) 325 MG tablet Take 650 mg by mouth as needed for pain.   Yes Historical Provider, MD  aspirin 81 MG tablet Take 81 mg by mouth daily.   Yes Historical Provider, MD  cholecalciferol (VITAMIN D) 400 UNITS TABS Take 400 Units by mouth daily.   Yes Historical Provider, MD  folic acid (FOLVITE) 1 MG tablet Take 1 mg by mouth daily.   Yes Historical Provider, MD  levothyroxine (SYNTHROID, LEVOTHROID) 75 MCG tablet Take 75 mcg by mouth daily before breakfast.   Yes Historical Provider, MD  niacin 500 MG tablet Take 500 mg by mouth every evening.    Yes Historical Provider, MD  Omega-3 Fatty Acids (OMEGA 3 PO) Take 1,000 mg by mouth daily.   Yes Historical Provider, MD  polyethylene glycol-electrolytes (NULYTELY/GOLYTELY) 420 G solution Take 4,000 mLs by mouth once.   Yes Historical  Provider, MD  ramipril (ALTACE) 10 MG capsule Take 10 mg by mouth every morning.   Yes Historical Provider, MD  rosuvastatin (CRESTOR) 10 MG tablet Take 1 tablet (10 mg total) by mouth daily. 02/10/13  Yes Tommy Medal, RPH-CPP  sotalol (BETAPACE) 80 MG tablet Take 80 mg by mouth 2 (two) times daily.   Yes Historical Provider, MD  triamterene-hydrochlorothiazide (MAXZIDE-25) 37.5-25 MG per tablet Take 0.5 tablets by mouth daily.   Yes Historical Provider, MD  warfarin (COUMADIN) 5 MG tablet Take 5-7.5 mg by mouth See admin instructions. Take 1&1/2 tab (7.5mg ) once daily except on Wednesdays. Take 1 tab (5mg ) only on Wednesdays   Yes Historical Provider, MD   BP 129/74  Pulse 75  Temp(Src) 98.6 F (37 C) (Oral)  Resp 16  Ht 5\' 3"  (1.6 m)  Wt 221 lb (100.245 kg)  BMI 39.16 kg/m2  SpO2 99% Physical Exam  Nursing note and vitals reviewed. Constitutional: She is oriented to person, place,  and time. She appears well-developed and well-nourished.  HENT:  Head: Normocephalic.  Hematoma right area. No step-off or deformity.  Eyes: EOM are normal. Pupils are equal, round, and reactive to light.  Neck: Normal range of motion. Neck supple.  No midline cervical tenderness. Range of motion intact.  Cardiovascular: Normal rate, regular rhythm and normal heart sounds.   No murmur heard. Pulmonary/Chest: Effort normal and breath sounds normal. No respiratory distress. She has no wheezes. She has no rales.  Abdominal: Soft. Bowel sounds are normal. She exhibits no distension. There is no tenderness. There is no rebound and no guarding.  Musculoskeletal: Normal range of motion.  Neurological: She is alert and oriented to person, place, and time. No cranial nerve deficit.  Skin: Skin is warm and dry.  Psychiatric: She has a normal mood and affect. Her speech is normal.    ED Course  Procedures (including critical care time) Labs Review Labs Reviewed  PROTIME-INR - Abnormal; Notable for the  following:    Prothrombin Time 22.2 (*)    INR 1.95 (*)    All other components within normal limits    Imaging Review Ct Head Wo Contrast  10/05/2013   CLINICAL DATA:  Fall.  Head injury.  Coumadin.  EXAM: CT HEAD WITHOUT CONTRAST  TECHNIQUE: Contiguous axial images were obtained from the base of the skull through the vertex without intravenous contrast.  COMPARISON:  None.  FINDINGS: Ventricles and sulci are symmetrical. No mass effect or midline shift. No abnormal extra-axial fluid collections. Gray-white matter junctions are distinct. Basal cisterns are not effaced. No evidence of acute intracranial hemorrhage. No depressed skull fractures. Visualized paranasal sinuses and mastoid air cells are not opacified. Subcutaneous soft tissue scalp hematoma over the right posterior parietal region.  IMPRESSION: No acute bony abnormalities.   Electronically Signed   By: Lucienne Capers M.D.   On: 10/05/2013 22:08     EKG Interpretation None      MDM   Final diagnoses:  Fall, initial encounter  Scalp hematoma, initial encounter  Minor head injury without loss of consciousness, initial encounter    Patient with fall on Coumadin. Hit her head without loss of conscious. Negative head CT. INR is 1.9. Will discharge home. Instructions given about risk of bleeding.    Jasper Riling. Alvino Chapel, MD 10/05/13 2259

## 2013-10-11 ENCOUNTER — Encounter: Payer: Self-pay | Admitting: Cardiovascular Disease

## 2013-10-13 ENCOUNTER — Ambulatory Visit (INDEPENDENT_AMBULATORY_CARE_PROVIDER_SITE_OTHER): Payer: 59 | Admitting: Pharmacist Clinician (PhC)/ Clinical Pharmacy Specialist

## 2013-10-13 DIAGNOSIS — I480 Paroxysmal atrial fibrillation: Secondary | ICD-10-CM

## 2013-10-13 DIAGNOSIS — Z7901 Long term (current) use of anticoagulants: Secondary | ICD-10-CM

## 2013-10-13 DIAGNOSIS — I4891 Unspecified atrial fibrillation: Secondary | ICD-10-CM

## 2013-10-13 LAB — POCT INR: INR: 3.9

## 2013-10-20 ENCOUNTER — Other Ambulatory Visit: Payer: Self-pay | Admitting: Cardiovascular Disease

## 2013-10-21 NOTE — Telephone Encounter (Signed)
Rx was sent to pharmacy electronically. 

## 2013-10-27 ENCOUNTER — Ambulatory Visit (INDEPENDENT_AMBULATORY_CARE_PROVIDER_SITE_OTHER): Payer: Medicare Other | Admitting: Pharmacist Clinician (PhC)/ Clinical Pharmacy Specialist

## 2013-10-27 DIAGNOSIS — Z7901 Long term (current) use of anticoagulants: Secondary | ICD-10-CM

## 2013-10-27 DIAGNOSIS — I480 Paroxysmal atrial fibrillation: Secondary | ICD-10-CM

## 2013-10-27 DIAGNOSIS — I4891 Unspecified atrial fibrillation: Secondary | ICD-10-CM

## 2013-10-27 LAB — POCT INR: INR: 2.5

## 2013-10-29 ENCOUNTER — Other Ambulatory Visit: Payer: Self-pay | Admitting: Pharmacist Clinician (PhC)/ Clinical Pharmacy Specialist

## 2013-11-12 ENCOUNTER — Other Ambulatory Visit: Payer: Self-pay

## 2013-11-24 ENCOUNTER — Ambulatory Visit (INDEPENDENT_AMBULATORY_CARE_PROVIDER_SITE_OTHER): Payer: 59 | Admitting: Pharmacist Clinician (PhC)/ Clinical Pharmacy Specialist

## 2013-11-24 DIAGNOSIS — Z7901 Long term (current) use of anticoagulants: Secondary | ICD-10-CM

## 2013-11-24 DIAGNOSIS — I4891 Unspecified atrial fibrillation: Secondary | ICD-10-CM

## 2013-11-24 DIAGNOSIS — I480 Paroxysmal atrial fibrillation: Secondary | ICD-10-CM

## 2013-11-24 LAB — POCT INR: INR: 2.4

## 2013-12-01 ENCOUNTER — Other Ambulatory Visit: Payer: Self-pay

## 2013-12-01 MED ORDER — FOLIC ACID 1 MG PO TABS: 1.0000 mg | ORAL_TABLET | Freq: Every day | ORAL | Status: DC

## 2013-12-01 NOTE — Telephone Encounter (Signed)
Rx sent to pharmacy   

## 2013-12-13 ENCOUNTER — Telehealth: Payer: Self-pay | Admitting: Cardiovascular Disease

## 2013-12-13 NOTE — Telephone Encounter (Signed)
**Note De-Identified [name] Obfuscation** I left a detailed message on the pts VM (she identified herself on her VM message) stating that her transmittal was received on 11/4 and that it was okay and that she is due to send another transmittal on Thursday morning (11/19).

## 2013-12-13 NOTE — Telephone Encounter (Signed)
Pt wants to know if her transmittal from 11-2 was received from her pacemaker?

## 2013-12-16 ENCOUNTER — Ambulatory Visit (INDEPENDENT_AMBULATORY_CARE_PROVIDER_SITE_OTHER): Payer: 59 | Admitting: *Deleted

## 2013-12-16 DIAGNOSIS — I480 Paroxysmal atrial fibrillation: Secondary | ICD-10-CM

## 2013-12-16 DIAGNOSIS — I442 Atrioventricular block, complete: Secondary | ICD-10-CM

## 2013-12-16 NOTE — Progress Notes (Signed)
Remote pacemaker transmission.   

## 2013-12-17 LAB — MDC_IDC_ENUM_SESS_TYPE_REMOTE
Battery Impedance: 134 Ohm
Battery Remaining Longevity: 123 mo
Battery Voltage: 2.79 V
Brady Statistic AS VP Percent: 83 %
Brady Statistic AS VS Percent: 0 %
Date Time Interrogation Session: 20151119111946
Lead Channel Impedance Value: 503 Ohm
Lead Channel Pacing Threshold Amplitude: 0.875 V
Lead Channel Pacing Threshold Amplitude: 1.25 V
Lead Channel Pacing Threshold Pulse Width: 0.4 ms
Lead Channel Sensing Intrinsic Amplitude: 2.8 mV
Lead Channel Setting Pacing Amplitude: 2 V
Lead Channel Setting Pacing Amplitude: 2.5 V
Lead Channel Setting Pacing Pulse Width: 0.4 ms
MDC IDC MSMT LEADCHNL RA IMPEDANCE VALUE: 438 Ohm
MDC IDC MSMT LEADCHNL RA PACING THRESHOLD PULSEWIDTH: 0.4 ms
MDC IDC SET LEADCHNL RV SENSING SENSITIVITY: 4 mV
MDC IDC STAT BRADY AP VP PERCENT: 17 %
MDC IDC STAT BRADY AP VS PERCENT: 0 %

## 2013-12-21 ENCOUNTER — Encounter: Payer: Self-pay | Admitting: Cardiology

## 2013-12-22 ENCOUNTER — Ambulatory Visit (INDEPENDENT_AMBULATORY_CARE_PROVIDER_SITE_OTHER): Payer: Medicare Other | Admitting: Pharmacist Clinician (PhC)/ Clinical Pharmacy Specialist

## 2013-12-22 DIAGNOSIS — Z7901 Long term (current) use of anticoagulants: Secondary | ICD-10-CM

## 2013-12-22 DIAGNOSIS — I4891 Unspecified atrial fibrillation: Secondary | ICD-10-CM

## 2013-12-22 DIAGNOSIS — I480 Paroxysmal atrial fibrillation: Secondary | ICD-10-CM

## 2013-12-22 LAB — POCT INR: INR: 2.7

## 2014-01-04 ENCOUNTER — Encounter: Payer: Self-pay | Admitting: Cardiology

## 2014-01-06 ENCOUNTER — Encounter: Payer: Self-pay | Admitting: Cardiovascular Disease

## 2014-01-06 ENCOUNTER — Encounter (HOSPITAL_COMMUNITY): Payer: Self-pay | Admitting: Cardiovascular Disease

## 2014-01-24 ENCOUNTER — Other Ambulatory Visit: Payer: Self-pay | Admitting: Family

## 2014-01-24 DIAGNOSIS — G629 Polyneuropathy, unspecified: Secondary | ICD-10-CM

## 2014-02-02 ENCOUNTER — Ambulatory Visit (INDEPENDENT_AMBULATORY_CARE_PROVIDER_SITE_OTHER): Payer: Medicare Other | Admitting: Pharmacist Clinician (PhC)/ Clinical Pharmacy Specialist

## 2014-02-02 VITALS — BP 112/68

## 2014-02-02 DIAGNOSIS — Z7901 Long term (current) use of anticoagulants: Secondary | ICD-10-CM

## 2014-02-02 DIAGNOSIS — I4891 Unspecified atrial fibrillation: Secondary | ICD-10-CM

## 2014-02-02 DIAGNOSIS — I480 Paroxysmal atrial fibrillation: Secondary | ICD-10-CM

## 2014-02-02 LAB — POCT INR: INR: 2

## 2014-02-23 ENCOUNTER — Ambulatory Visit
Admission: RE | Admit: 2014-02-23 | Discharge: 2014-02-23 | Disposition: A | Discharge status: Home / Self Care | Payer: Medicare Other | Source: Ambulatory Visit | Attending: Family | Admitting: Family

## 2014-02-23 DIAGNOSIS — G629 Polyneuropathy, unspecified: Secondary | ICD-10-CM

## 2014-03-02 ENCOUNTER — Ambulatory Visit
Admission: RE | Admit: 2014-03-02 | Discharge: 2014-03-02 | Disposition: A | Discharge status: Home / Self Care | Payer: Medicare Other | Source: Ambulatory Visit | Attending: Family | Admitting: Family

## 2014-03-11 ENCOUNTER — Telehealth: Payer: Self-pay | Admitting: Cardiovascular Disease

## 2014-03-11 NOTE — Telephone Encounter (Signed)
New Msg        Pt calling, trying to order an upgrade for device and has some questions.    Please return call.

## 2014-03-14 NOTE — Telephone Encounter (Signed)
Pt informed me that monitor upgrade has been ordered.

## 2014-03-16 ENCOUNTER — Ambulatory Visit (INDEPENDENT_AMBULATORY_CARE_PROVIDER_SITE_OTHER): Payer: Medicare Other | Admitting: Pharmacist Clinician (PhC)/ Clinical Pharmacy Specialist

## 2014-03-16 DIAGNOSIS — I480 Paroxysmal atrial fibrillation: Secondary | ICD-10-CM

## 2014-03-16 DIAGNOSIS — Z7901 Long term (current) use of anticoagulants: Secondary | ICD-10-CM

## 2014-03-16 DIAGNOSIS — I4891 Unspecified atrial fibrillation: Secondary | ICD-10-CM

## 2014-03-16 LAB — POCT INR: INR: 2.8

## 2014-03-21 ENCOUNTER — Ambulatory Visit (INDEPENDENT_AMBULATORY_CARE_PROVIDER_SITE_OTHER): Payer: Medicare Other | Admitting: *Deleted

## 2014-03-21 DIAGNOSIS — I442 Atrioventricular block, complete: Secondary | ICD-10-CM

## 2014-03-21 NOTE — Progress Notes (Signed)
Remote pacemaker transmission.   

## 2014-03-24 LAB — MDC_IDC_ENUM_SESS_TYPE_REMOTE
Battery Remaining Longevity: 118 mo
Brady Statistic AS VP Percent: 81 %
Brady Statistic AS VS Percent: 0 %
Date Time Interrogation Session: 20160222145045
Lead Channel Impedance Value: 503 Ohm
Lead Channel Pacing Threshold Amplitude: 1.25 V
Lead Channel Pacing Threshold Pulse Width: 0.4 ms
Lead Channel Sensing Intrinsic Amplitude: 2.8 mV
Lead Channel Setting Pacing Amplitude: 2 V
Lead Channel Setting Sensing Sensitivity: 4 mV
MDC IDC MSMT BATTERY IMPEDANCE: 158 Ohm
MDC IDC MSMT BATTERY VOLTAGE: 2.79 V
MDC IDC MSMT LEADCHNL RA IMPEDANCE VALUE: 470 Ohm
MDC IDC MSMT LEADCHNL RA PACING THRESHOLD AMPLITUDE: 0.875 V
MDC IDC MSMT LEADCHNL RV PACING THRESHOLD PULSEWIDTH: 0.4 ms
MDC IDC SET LEADCHNL RV PACING AMPLITUDE: 2.5 V
MDC IDC SET LEADCHNL RV PACING PULSEWIDTH: 0.4 ms
MDC IDC STAT BRADY AP VP PERCENT: 18 %
MDC IDC STAT BRADY AP VS PERCENT: 0 %

## 2014-04-01 ENCOUNTER — Encounter: Payer: Self-pay | Admitting: Cardiology

## 2014-04-06 ENCOUNTER — Encounter: Payer: Self-pay | Admitting: Cardiovascular Disease

## 2014-04-15 ENCOUNTER — Encounter: Payer: Self-pay | Admitting: Cardiology

## 2014-04-20 ENCOUNTER — Ambulatory Visit (INDEPENDENT_AMBULATORY_CARE_PROVIDER_SITE_OTHER): Payer: Medicare Other | Admitting: Pharmacist Clinician (PhC)/ Clinical Pharmacy Specialist

## 2014-04-20 ENCOUNTER — Other Ambulatory Visit: Payer: Self-pay | Admitting: Cardiovascular Disease

## 2014-04-20 VITALS — BP 118/70 | HR 72

## 2014-04-20 DIAGNOSIS — I4891 Unspecified atrial fibrillation: Secondary | ICD-10-CM | POA: Diagnosis not present

## 2014-04-20 DIAGNOSIS — I480 Paroxysmal atrial fibrillation: Secondary | ICD-10-CM | POA: Diagnosis not present

## 2014-04-20 DIAGNOSIS — Z7901 Long term (current) use of anticoagulants: Secondary | ICD-10-CM | POA: Diagnosis not present

## 2014-04-20 LAB — POCT INR: INR: 2.6

## 2014-04-20 NOTE — Telephone Encounter (Signed)
Rx(s) sent to pharmacy electronically.  

## 2014-06-01 ENCOUNTER — Ambulatory Visit: Payer: Medicare Other | Admitting: Pharmacist Clinician (PhC)/ Clinical Pharmacy Specialist

## 2014-06-02 ENCOUNTER — Ambulatory Visit (INDEPENDENT_AMBULATORY_CARE_PROVIDER_SITE_OTHER): Payer: Medicare Other | Admitting: Pharmacist Clinician (PhC)/ Clinical Pharmacy Specialist

## 2014-06-02 DIAGNOSIS — I4891 Unspecified atrial fibrillation: Secondary | ICD-10-CM

## 2014-06-02 DIAGNOSIS — Z7901 Long term (current) use of anticoagulants: Secondary | ICD-10-CM | POA: Diagnosis not present

## 2014-06-02 DIAGNOSIS — I480 Paroxysmal atrial fibrillation: Secondary | ICD-10-CM

## 2014-06-02 LAB — POCT INR: INR: 2.5

## 2014-06-22 ENCOUNTER — Telehealth: Payer: Self-pay | Admitting: Cardiovascular Disease

## 2014-06-22 ENCOUNTER — Telehealth: Payer: Self-pay | Admitting: Cardiology

## 2014-06-22 ENCOUNTER — Ambulatory Visit (INDEPENDENT_AMBULATORY_CARE_PROVIDER_SITE_OTHER): Payer: Medicare Other | Admitting: *Deleted

## 2014-06-22 DIAGNOSIS — I442 Atrioventricular block, complete: Secondary | ICD-10-CM | POA: Diagnosis not present

## 2014-06-22 NOTE — Telephone Encounter (Signed)
Spoke with patient- verified we did receive transmission. Pt verbalizes understanding.

## 2014-06-22 NOTE — Telephone Encounter (Signed)
New Prob    Pt is calling to verify her transmission was received. Please call.

## 2014-06-22 NOTE — Telephone Encounter (Signed)
Spoke with pt and reminded pt of remote transmission that is due today. Pt verbalized understanding.   

## 2014-06-23 NOTE — Progress Notes (Signed)
Remote pacemaker transmission.   

## 2014-06-24 ENCOUNTER — Other Ambulatory Visit: Payer: Self-pay | Admitting: Cardiovascular Disease

## 2014-06-24 LAB — CUP PACEART REMOTE DEVICE CHECK
Battery Voltage: 2.79 V
Brady Statistic AP VP Percent: 18 %
Brady Statistic AP VS Percent: 0 %
Brady Statistic AS VP Percent: 82 %
Date Time Interrogation Session: 20160525133556
Lead Channel Impedance Value: 470 Ohm
Lead Channel Impedance Value: 513 Ohm
Lead Channel Pacing Threshold Amplitude: 1.25 V
Lead Channel Pacing Threshold Pulse Width: 0.4 ms
Lead Channel Pacing Threshold Pulse Width: 0.4 ms
Lead Channel Setting Pacing Amplitude: 2 V
Lead Channel Setting Pacing Amplitude: 2.5 V
Lead Channel Setting Sensing Sensitivity: 4 mV
MDC IDC MSMT BATTERY IMPEDANCE: 158 Ohm
MDC IDC MSMT BATTERY REMAINING LONGEVITY: 118 mo
MDC IDC MSMT LEADCHNL RA PACING THRESHOLD AMPLITUDE: 0.75 V
MDC IDC MSMT LEADCHNL RA SENSING INTR AMPL: 2.8 mV
MDC IDC SET LEADCHNL RV PACING PULSEWIDTH: 0.4 ms
MDC IDC STAT BRADY AS VS PERCENT: 0 %

## 2014-06-30 ENCOUNTER — Other Ambulatory Visit: Payer: Self-pay | Admitting: Pharmacist Clinician (PhC)/ Clinical Pharmacy Specialist

## 2014-07-01 ENCOUNTER — Encounter: Payer: Self-pay | Admitting: Cardiology

## 2014-07-07 ENCOUNTER — Encounter: Payer: Self-pay | Admitting: Cardiovascular Disease

## 2014-07-13 ENCOUNTER — Ambulatory Visit (INDEPENDENT_AMBULATORY_CARE_PROVIDER_SITE_OTHER): Payer: Medicare Other | Admitting: Pharmacist Clinician (PhC)/ Clinical Pharmacy Specialist

## 2014-07-13 DIAGNOSIS — Z7901 Long term (current) use of anticoagulants: Secondary | ICD-10-CM

## 2014-07-13 DIAGNOSIS — I480 Paroxysmal atrial fibrillation: Secondary | ICD-10-CM

## 2014-07-13 DIAGNOSIS — I4891 Unspecified atrial fibrillation: Secondary | ICD-10-CM

## 2014-07-13 LAB — POCT INR: INR: 2.6

## 2014-07-18 ENCOUNTER — Encounter: Payer: Self-pay | Admitting: Cardiology

## 2014-07-25 ENCOUNTER — Other Ambulatory Visit: Payer: Self-pay

## 2014-08-24 ENCOUNTER — Ambulatory Visit: Payer: Medicare Other | Admitting: Pharmacist Clinician (PhC)/ Clinical Pharmacy Specialist

## 2014-08-26 ENCOUNTER — Ambulatory Visit (INDEPENDENT_AMBULATORY_CARE_PROVIDER_SITE_OTHER): Payer: Medicare Other | Admitting: Pharmacist Clinician (PhC)/ Clinical Pharmacy Specialist

## 2014-08-26 DIAGNOSIS — Z7901 Long term (current) use of anticoagulants: Secondary | ICD-10-CM | POA: Diagnosis not present

## 2014-08-26 DIAGNOSIS — I480 Paroxysmal atrial fibrillation: Secondary | ICD-10-CM

## 2014-08-26 DIAGNOSIS — I4891 Unspecified atrial fibrillation: Secondary | ICD-10-CM | POA: Diagnosis not present

## 2014-08-26 LAB — POCT INR: INR: 2.9

## 2014-09-24 ENCOUNTER — Other Ambulatory Visit: Payer: Self-pay | Admitting: Cardiovascular Disease

## 2014-09-26 NOTE — Telephone Encounter (Signed)
REFILL 

## 2014-10-05 ENCOUNTER — Ambulatory Visit (INDEPENDENT_AMBULATORY_CARE_PROVIDER_SITE_OTHER): Payer: Medicare Other | Admitting: Pharmacist Clinician (PhC)/ Clinical Pharmacy Specialist

## 2014-10-05 DIAGNOSIS — I480 Paroxysmal atrial fibrillation: Secondary | ICD-10-CM

## 2014-10-05 DIAGNOSIS — I4891 Unspecified atrial fibrillation: Secondary | ICD-10-CM

## 2014-10-05 DIAGNOSIS — Z7901 Long term (current) use of anticoagulants: Secondary | ICD-10-CM

## 2014-10-05 LAB — POCT INR: INR: 2.8

## 2014-10-11 ENCOUNTER — Other Ambulatory Visit: Payer: Self-pay | Admitting: Radiology

## 2014-10-28 ENCOUNTER — Other Ambulatory Visit: Payer: Self-pay | Admitting: General Surgery

## 2014-10-28 ENCOUNTER — Telehealth: Payer: Self-pay | Admitting: *Deleted

## 2014-10-28 ENCOUNTER — Encounter: Payer: Self-pay | Admitting: Cardiovascular Disease

## 2014-10-28 DIAGNOSIS — D241 Benign neoplasm of right breast: Secondary | ICD-10-CM

## 2014-10-28 NOTE — Telephone Encounter (Signed)
Patient notified surgical clearance letter sent to Dr. Dalbert Batman and instructed to send a pacemaker download from home.  Patient will do today and states she has an appointment 10/18 to see Dr. Loletha Grayer.

## 2014-10-28 NOTE — Telephone Encounter (Signed)
Requesting surgical clearance:   1. Type of surgery: right breast lumpectomy  2. Surgeon: Dr. Dalbert Batman  3. Surgical date: not scheduled until clearance done  4. Medications that need to be held: coumadin - requesting 5 days pre-op/ patient also on ASA 81mg  qd.  Does she require Lovenox bridging?

## 2014-11-15 ENCOUNTER — Ambulatory Visit (INDEPENDENT_AMBULATORY_CARE_PROVIDER_SITE_OTHER): Payer: Medicare Other | Admitting: Cardiovascular Disease

## 2014-11-15 ENCOUNTER — Ambulatory Visit (INDEPENDENT_AMBULATORY_CARE_PROVIDER_SITE_OTHER): Payer: Medicare Other | Admitting: Pharmacist Clinician (PhC)/ Clinical Pharmacy Specialist

## 2014-11-15 ENCOUNTER — Encounter: Payer: Self-pay | Admitting: Cardiovascular Disease

## 2014-11-15 VITALS — BP 86/62 | HR 67 | Resp 16 | Ht 63.0 in | Wt 209.0 lb

## 2014-11-15 DIAGNOSIS — Z950 Presence of cardiac pacemaker: Secondary | ICD-10-CM | POA: Diagnosis not present

## 2014-11-15 DIAGNOSIS — Z7901 Long term (current) use of anticoagulants: Secondary | ICD-10-CM | POA: Diagnosis not present

## 2014-11-15 DIAGNOSIS — I4891 Unspecified atrial fibrillation: Secondary | ICD-10-CM

## 2014-11-15 DIAGNOSIS — I442 Atrioventricular block, complete: Secondary | ICD-10-CM

## 2014-11-15 DIAGNOSIS — I480 Paroxysmal atrial fibrillation: Secondary | ICD-10-CM | POA: Diagnosis not present

## 2014-11-15 DIAGNOSIS — Z9889 Other specified postprocedural states: Secondary | ICD-10-CM

## 2014-11-15 DIAGNOSIS — Z79899 Other long term (current) drug therapy: Secondary | ICD-10-CM

## 2014-11-15 LAB — POCT INR: INR: 3.6

## 2014-11-15 NOTE — Progress Notes (Signed)
Patient ID: [name], female   DOB: 04/28/1945, 69 y.o.   MRN: [id number]     Cardiology Office Note   Date:  11/17/2014   ID:  [name], DOB 02-Nov-1945, MRN [id number]  PCP:  Pcp Not In System  Cardiologist:   Michelle Klein, MD   Chief Complaint  Patient presents with  . Annual Exam      History of Present Illness: [name] is a 69 y.o. female who presents for Valvular heart disease (status post mitral valve Todd), sinus node dysfunction, high-grade AV block, paroxysmal atrial fibrillation, pacemaker followup  She is due to have breast surgery next week with Dr. Dalbert Todd  , for a breast papilloma.  Interrogation of her pacemaker today shows normal function.  Estimated generator longevity is 9 years.She has  18% atrial pacing with a good heart rate distribution. She has complete heart block with a very slow ventricular escape rhythm at 30 beats per minute. She has 100% ventricular pacing. The burden of atrial fibrillation  1%.  The longest episode of recent atrial fibrillation lasted for 2 hours and 41 minutes on September 12. Ventricular rate control was good. She has not had meaningful ventricular arrhythmia.   her blood pressure is quite low today and she has occasional bouts of dizziness although she has not experienced syncope  [name] for severe mitral insufficiency in 2006. She did not have coronary disease by preoperative angiography and has normal left ventricular systolic function. Followup echocardiography 09/30/2013 shows borderline low left ventricular ejection fraction (45-50%). Valve repair looks good. 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 The right atrial lead is a conventional transvenous lead. The ventricular lead is an active fixation epicardial lead on the surface of the left ventricle placed at the time of her  surgery. A generator change was performed in 2014. Her pacemaker is located in a deep subpectoral pocket. She has paroxysmal atrial fibrillation. She has treated hypertension, dyslipidemia and type 2 diabetes mellitus on the background of severe obesity.    Past Medical History  Diagnosis Date  . Mitral valve insufficiency     Recieved Mitral valve repair with reconstruction of the anterior leaflet cordis with cortex and ring Todd  . Dyslipidemia     takes Niacin and Crestor daily  . Atrial fibrillation ([place])     PAF---currently anticoagulated with Warfarin  . RBBB     Due the VP with the LV epicardial lead that was inserted in 2006 during the mitral repair  . Congenital heart block 08/26/2003    Diagnosed by [name]. A temp orary pacemaker was placed in 1988 during hysterectomy procedure.  . Atrial flutter ([place]) 02/21/2004    s/p mitral valve repair and ppm insertion  . Nonischemic cardiomyopathy ([place]) 2006    EF of 35% due to AV dyssyncrony. Has since resolved per most recent ECHO.  Marland Kitchen Hypothyroidism     takes Synthroid daily  . Systemic hypertension     takes  Ramipril daily-Maxzide every other day  . Arthritis   . Diverticulosis   . Urinary urgency   . History of blood transfusion     no abnormal reaction noted  . Diabetes mellitus without complication ([place])     prediabetic    Past Surgical History  Procedure Laterality Date  . Mitral valve Todd  02/06/2004    Performed for severe MR requiring mitral  valve repair with anterior leaflet reconstruction and a Axtell Todd ring--model: [id number], and an 80% lesion reduction ([name]). Two LV leads were placed epicardially just in the event that biv pacing would be needed. (One of the leads had been used for during the ppm insertion recieved one week later)   . Permanent pacemaker insertion  02/13/2004    PPM implant following a mitral valve Todd. An epicardial  active fixation lead (implanted on 02/06/2004 during the mitral valve procedure) was used for this implant.   . Cardiovascular stress test  09/14/2003    Performed for the pt's congenital CHB that was discovered during a preoperative evaluation for a colonoscopy.  EKG demonstrated CHB with junctional escape. Nonspecific ST changes noted. Stress ECG was positive for ischemia(2 mm ST depression). Dilated LV cavity. Inability to calculate systolic function. High risk scan. This scan was followed up with a cardiac cath.  . Cardiac catheterization  10/20/2003    Minimal decrease in the LV systolic EF of 62-37% with global hyperkinesis. Moderately dilated LV. 3+ Mitral regurgitation. Underfilling of LAD which promptly improved with intracoronary NTG administration. Microvascular angina or microvascular spasm. Otherwise coronaries were normal. No intervention necessary.  . US echocardiography  09/10/2010 and1/07/2008    The last echo showed an EF of >55%; RV mildly dilated. Mild to moderately dilated LA  with borderline RA dilation as well. Impaired diastolic function. Normal RV systolic function.MVR without residual insufficiency.No Pericardial effusion.  Marland Kitchen Permanent pacemaker generator change  07/06/2012    Medtronic  . Permanent pacemaker generator change N/A 07/06/2012    Procedure: PERMANENT PACEMAKER GENERATOR CHANGE;  Surgeon: Michelle Klein, MD;  Location: [facility];  Service: Cardiovascular;  Laterality: N/A;  . Colonoscopy    . Abdominal hysterectomy       Current Outpatient Prescriptions  Medication Sig Dispense Refill  . acetaminophen (TYLENOL) 325 MG tablet Take 650 mg by mouth as needed for pain.    Marland Kitchen aspirin 81 MG tablet Take 81 mg by mouth daily.    . cholecalciferol (VITAMIN D) 400 UNITS TABS Take 400 Units by mouth daily.    . folic acid (FOLVITE) 1 MG tablet Take 1 tablet (1 mg total) by mouth daily. 90 tablet 4  . levothyroxine (SYNTHROID, LEVOTHROID) 75 MCG tablet Take 75 mcg by mouth  daily before breakfast.    . niacin 500 MG tablet Take 500 mg by mouth every evening.     . Omega-3 Fatty Acids (OMEGA 3 PO) Take 1,000 mg by mouth daily.    . ramipril (ALTACE) 10 MG capsule TAKE ONE CAPSULE BY MOUTH AT BEDTIME 30 capsule 5  . rosuvastatin (CRESTOR) 10 MG tablet Take 1 tablet (10 mg total) by mouth daily. 28 tablet 0  . sotalol (BETAPACE) 80 MG tablet TAKE 1 TABLET BY MOUTH TWICE DAILY 60 tablet 5  . triamterene-hydrochlorothiazide (MAXZIDE-25) 37.5-25 MG per tablet TAKE 1/2 TABLET BY MOUTH EVERY DAY AS DIRECTED (Patient taking differently: TAKE 1/2 TABLET BY MOUTH EVERY MONDAY, WEDNESDAY AND FRIDAY) 15 tablet 2  . warfarin (COUMADIN) 5 MG tablet Take 5-7.5 mg by mouth See admin instructions. Take 1&1/2 tab (7.5mg ) once daily except Take 1 tab (5mg ) only on Monday, Wednesday, and Friday     No current facility-administered medications for this visit.    Allergies:   Review of patient's allergies indicates no known allergies.    Social History:  The patient  reports that she has quit smoking. Her smoking use  included Cigarettes. She does not have any smokeless tobacco history on file. She reports that she drinks alcohol. She reports that she does not use illicit drugs.   Family History:  The patient's family history includes Asthma in her brother and sister; Cancer - Other in her mother; Healthy in her brother, brother, brother, and brother; Pneumonia in her father.    ROS:  Please see the history of present illness.    Otherwise, review of systems positive for none.   All other systems are reviewed and negative.    PHYSICAL EXAM: VS:  BP 86/62 mmHg  Pulse 67  Resp 16  Ht 5\' 3"  (1.6 m)  Wt 209 lb (94.802 kg)  BMI 37.03 kg/m2 , BMI Body mass index is 37.03 kg/(m^2).  blood pressure rechecked 99/60 mmHg General: Alert, oriented x3, no distress Head: no evidence of trauma, PERRL, EOMI, no exophtalmos or lid lag, no myxedema, no xanthelasma; normal ears, nose and  oropharynx Neck: normal jugular venous pulsations and no hepatojugular reflux; brisk carotid pulses without delay and no carotid bruits Chest: clear to auscultation, no signs of consolidation by percussion or palpation, normal fremitus, symmetrical and full respiratory excursions Cardiovascular: normal position and quality of the apical impulse, regular rhythm, normal first and  Paradoxically split second heart sounds, no murmurs, rubs or gallops Abdomen: no tenderness or distention, no masses by palpation, no abnormal pulsatility or arterial bruits, normal bowel sounds, no hepatosplenomegaly Extremities: no clubbing, cyanosis or edema; 2+ radial, ulnar and brachial pulses bilaterally; 2+ right femoral, posterior tibial and dorsalis pedis pulses; 2+ left femoral, posterior tibial and dorsalis pedis pulses; no subclavian or femoral bruits Neurological: grossly nonfocal Psych: euthymic mood, full affect   EKG:  EKG is ordered today. The ekg ordered today demonstrates A sensed, V paced (+ve R in V1), QTC 553 ms   Recent Labs: 11/16/2014: ALT 19; BUN 23*; Creatinine, Ser 1.21*; Hemoglobin 13.0; Platelets 285; Potassium 4.1; Sodium 138    Lipid Panel No results found for: CHOL, TRIG, HDL, CHOLHDL, VLDL, LDLCALC, LDLDIRECT    Wt Readings from Last 3 Encounters:  11/16/14 210 lb 9.6 oz (95.528 kg)  11/15/14 209 lb (94.802 kg)  10/05/13 221 lb (100.245 kg)    ASSESSMENT AND PLAN:  Complete heart block  Pacemaker dependent. If she requires electrocautery for her breast surgery, device reprogramming or magnet application is necessary to avoid device inhibition.  She is otherwise at low risk for major cardiovascular complications with the planned breast surgery.  Pacemaker  Normal device function status post recent generator change.  H/O mitral valve repair  Todd repair and closure of left atrial appendage in 2006 by [name]; followup echo in 2012 shows no evidence of mitral  insufficiency. The left atrium is mild moderately dilated. The left ventricle is normal in size and systolic function. Surprisingly left ventricular ejection fraction actually improved after surgery (preop 40-45%). No overt congestive heart failure.  PAF (paroxysmal atrial fibrillation)  Warfarin anticoagulation. Low (1%) atrial fibrillation burden, but she has sporadic episodes lasting for several hours. Embolic risk is relatively high with history of valvular heart disease and abnormal left ventricular systolic function. CHADSVasc at least 4.  Exertional dyspnea -  Combined systolic and diastolic heart failure Echo 2015 shows borderline low left ventricular ejection fraction (45-50%). Valve repair looks good. I think that the last echo before that, which showed normal left ventricular ejection fraction, was actually an overestimation. Direct comparison shows little change. Her blood pressure is quite  low and I recommended reducing the dose of diuretic to half tablet only 3 days a week   Current medicines are reviewed at length with the patient today.  The patient does not have concerns regarding medicines.  The following changes have been made:   Reduce triamterene/hydrochlorothiazide to one half tablet 3 days a week  Labs/ tests ordered today include:   Orders Placed This Encounter  Procedures  . Basic Metabolic Panel (BMET)  . EKG 12-Lead     Patient Instructions  Your physician wants you to follow-up in: 6 Months. You will receive a reminder letter in the mail two months in advance. If you don't receive a letter, please call our office to schedule the follow-up appointment.  Remote monitoring is used to monitor your Pacemaker of ICD from home. This monitoring reduces the number of office visits required to check your device to one time per year. It allows [place] to keep an eye on the functioning of your device to ensure it is working properly. You are scheduled for a device check from  home on 02/14/2015. You may send your transmission at any time that day. If you have a wireless device, the transmission will be sent automatically. After your physician reviews your transmission, you will receive a postcard with your next transmission date.  Your physician has recommended you make the following change in your medication: Take Triamterene/HCTZ every Monday, Wednesday and Friday  Your physician recommends that you return for lab work in: [facility]  If you need a refill on your cardiac medications before your next appointment, please call your pharmacy.           Mikael Spray, MD  11/17/2014 1:08 PM    Michelle Klein, MD, [facility] [phone] office [phone] pager

## 2014-11-15 NOTE — Patient Instructions (Addendum)
Your physician wants you to follow-up in: 6 Months. You will receive a reminder letter in the mail two months in advance. If you don't receive a letter, please call our office to schedule the follow-up appointment.  Remote monitoring is used to monitor your Pacemaker of ICD from home. This monitoring reduces the number of office visits required to check your device to one time per year. It allows [place] to keep an eye on the functioning of your device to ensure it is working properly. You are scheduled for a device check from home on 02/14/2015. You may send your transmission at any time that day. If you have a wireless device, the transmission will be sent automatically. After your physician reviews your transmission, you will receive a postcard with your next transmission date.  Your physician has recommended you make the following change in your medication: Take Triamterene/HCTZ every Monday, Wednesday and Friday  Your physician recommends that you return for lab work in: [facility]  If you need a refill on your cardiac medications before your next appointment, please call your pharmacy.

## 2014-11-16 ENCOUNTER — Encounter (HOSPITAL_COMMUNITY)
Admission: RE | Admit: 2014-11-16 | Discharge: 2014-11-16 | Disposition: A | Discharge status: Home / Self Care | Payer: Medicare Other | Source: Ambulatory Visit | Attending: General Surgery | Admitting: General Surgery

## 2014-11-16 ENCOUNTER — Encounter (HOSPITAL_COMMUNITY): Payer: Self-pay

## 2014-11-16 DIAGNOSIS — D241 Benign neoplasm of right breast: Secondary | ICD-10-CM | POA: Diagnosis not present

## 2014-11-16 DIAGNOSIS — Z01812 Encounter for preprocedural laboratory examination: Secondary | ICD-10-CM | POA: Diagnosis present

## 2014-11-16 HISTORY — DX: Diverticulosis of intestine, part unspecified, without perforation or abscess without bleeding: K57.90

## 2014-11-16 HISTORY — DX: Unspecified osteoarthritis, unspecified site: M19.90

## 2014-11-16 HISTORY — DX: Hypothyroidism, unspecified: E03.9

## 2014-11-16 HISTORY — DX: Personal history of other medical treatment: Z92.89

## 2014-11-16 HISTORY — DX: Urgency of urination: R39.15

## 2014-11-16 HISTORY — DX: Type 2 diabetes mellitus without complications: E11.9

## 2014-11-16 LAB — COMPREHENSIVE METABOLIC PANEL
ALK PHOS: 52 U/L (ref 38–126)
ALT: 19 U/L (ref 14–54)
AST: 21 U/L (ref 15–41)
Albumin: 3.6 g/dL (ref 3.5–5.0)
Anion gap: 7 (ref 5–15)
BILIRUBIN TOTAL: 0.6 mg/dL (ref 0.3–1.2)
BUN: 23 mg/dL — AB (ref 6–20)
CALCIUM: 9.2 mg/dL (ref 8.9–10.3)
CHLORIDE: 104 mmol/L (ref 101–111)
CO2: 27 mmol/L (ref 22–32)
CREATININE: 1.21 mg/dL — AB (ref 0.44–1.00)
GFR, EST AFRICAN AMERICAN: 52 mL/min — AB (ref >60)
GFR, EST NON AFRICAN AMERICAN: 45 mL/min — AB (ref >60)
Glucose, Bld: 111 mg/dL — ABNORMAL HIGH (ref 65–99)
Potassium: 4.1 mmol/L (ref 3.5–5.1)
Sodium: 138 mmol/L (ref 135–145)
Total Protein: 6.8 g/dL (ref 6.5–8.1)

## 2014-11-16 LAB — CBC WITH DIFFERENTIAL/PLATELET
BASOS ABS: 0 10*3/uL (ref 0.0–0.1)
Basophils Relative: 1 %
EOS PCT: 6 %
Eosinophils Absolute: 0.2 10*3/uL (ref 0.0–0.7)
HEMATOCRIT: 40.8 % (ref 36.0–46.0)
HEMOGLOBIN: 13 g/dL (ref 12.0–15.0)
LYMPHS ABS: 1.5 10*3/uL (ref 0.7–4.0)
LYMPHS PCT: 39 %
MCH: 27.3 pg (ref 26.0–34.0)
MCHC: 31.9 g/dL (ref 30.0–36.0)
MCV: 85.5 fL (ref 78.0–100.0)
Monocytes Absolute: 0.4 10*3/uL (ref 0.1–1.0)
Monocytes Relative: 10 %
NEUTROS ABS: 1.7 10*3/uL (ref 1.7–7.7)
Neutrophils Relative %: 44 %
Platelets: 285 10*3/uL (ref 150–400)
RBC: 4.77 MIL/uL (ref 3.87–5.11)
RDW: 14.1 % (ref 11.5–15.5)
WBC: 3.9 10*3/uL — AB (ref 4.0–10.5)

## 2014-11-16 LAB — APTT: APTT: 42 s — AB (ref 24–37)

## 2014-11-16 LAB — PROTIME-INR
INR: 2.92 — AB (ref 0.00–1.49)
PROTHROMBIN TIME: 30 s — AB (ref 11.6–15.2)

## 2014-11-16 MED ORDER — CHLORHEXIDINE GLUCONATE 4 % EX LIQD: 1.0000 "application " | Freq: Once | CUTANEOUS | Status: DC

## 2014-11-16 NOTE — Progress Notes (Addendum)
Dr.Croitoru is cardiologist with last visit in epic from 11-15-14.Pt states he told her to hold her Coumadin starting 10/1814 until after surgery  Multiple echo reports in epic with most recent in 2015  Heart cath report in epic from 2005  Stress test in 2005  EKG done 11/15/14 with Dr.Croitoru  CXR denies having one in past yr  Medical Md with [facility]

## 2014-11-17 ENCOUNTER — Encounter: Payer: Self-pay | Admitting: Cardiovascular Disease

## 2014-11-17 LAB — CUP PACEART INCLINIC DEVICE CHECK
Battery Remaining Longevity: 110 mo
Brady Statistic AP VS Percent: 0 %
Brady Statistic AS VS Percent: 0 %
Implantable Lead Implant Date: 20060109
Implantable Lead Model: 5071
Implantable Lead Model: 5076
Lead Channel Impedance Value: 457 Ohm
Lead Channel Pacing Threshold Amplitude: 0.875 V
Lead Channel Pacing Threshold Amplitude: 1 V
Lead Channel Pacing Threshold Amplitude: 1.25 V
Lead Channel Pacing Threshold Pulse Width: 0.4 ms
Lead Channel Pacing Threshold Pulse Width: 0.4 ms
Lead Channel Sensing Intrinsic Amplitude: 4 mV
Lead Channel Setting Pacing Amplitude: 2 V
Lead Channel Setting Pacing Amplitude: 2.5 V
Lead Channel Setting Pacing Pulse Width: 0.4 ms
MDC IDC LEAD IMPLANT DT: 20060116
MDC IDC LEAD LOCATION: 753858
MDC IDC LEAD LOCATION: 753859
MDC IDC MSMT BATTERY IMPEDANCE: 206 Ohm
MDC IDC MSMT BATTERY VOLTAGE: 2.8 V
MDC IDC MSMT LEADCHNL RA PACING THRESHOLD AMPLITUDE: 0.75 V
MDC IDC MSMT LEADCHNL RA PACING THRESHOLD PULSEWIDTH: 0.4 ms
MDC IDC MSMT LEADCHNL RA PACING THRESHOLD PULSEWIDTH: 0.4 ms
MDC IDC MSMT LEADCHNL RV IMPEDANCE VALUE: 513 Ohm
MDC IDC SESS DTM: 20161018131032
MDC IDC SET LEADCHNL RV SENSING SENSITIVITY: 4 mV
MDC IDC STAT BRADY AP VP PERCENT: 18 %
MDC IDC STAT BRADY AS VP PERCENT: 82 %

## 2014-11-17 LAB — HEMOGLOBIN A1C
HEMOGLOBIN A1C: 6.4 % — AB (ref 4.8–5.6)
MEAN PLASMA GLUCOSE: 137 mg/dL

## 2014-11-21 MED ORDER — CEFAZOLIN SODIUM-DEXTROSE 2-3 GM-% IV SOLR
2.0000 g | INTRAVENOUS | Status: AC
  Administered 2014-11-22: 2 g via INTRAVENOUS
  Filled 2014-11-21: qty 50

## 2014-11-21 NOTE — H&P (Signed)
Varney Baas [place]  Location: [facility] Patient #: [id number] DOB: May 29, 1945 Single / Language: Cleophus Molt / Race: Black or African American Female       History of Present Illness  The patient is a 69 year old female who presents with a breast mass. She is referred by Dr. Gae Dry for evaluation and management of an intraductal papilloma of the right breast at the 10 o'clock position. Dr. M.Croitoru is her cardiologist. Larence Penning, NP provides primary care. The patient has not had any significant breast disease in the past. She gets mammograms periodically. Recent mammograms show several nodules and cysts.       In the right breast there was a 9 mm lobulated mass at the 10 o'clock position which was felt to be suspicious. Biopsy showed intraductal papilloma and usual ductal hyperplasia. There was a second small mass in the left breast 4:00 which was suspicious. Biopsy showed fibrocystic disease and usual ductal hyperplasia. She has done well following the biopsy. 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 Sick sinus syndrome. Anticoagulated on Coumadin. Hypertension. History of sternotomy for mitral valve repair by Dr. Pia Mau. Obesity with BMI 39. Family history reveals breast cancer, in one aunt, premenopausal. No other breast, ovarian, or pancreatic cancer. Mother had rheumatoid arthritis and died of liver disease. Father died of pneumonia. Patient is single. Has no children. Lives in [place]. Quit smoking 2006. Strict alcohol occasionally.       We had a long talk about the low but definite risk of noninvasive cancer in this area. We talked about excisional biopsy which the standard of care at this time versus close observation. She clearly wants to go ahead and get this area excised to clarify her diagnosis. We told her that she would need to be off of Coumadin for  5 days and we would need clearance from Dr. Sallyanne Kuster for this. He may or may not need to bridge her with Lovenox. She will be scheduled for right breast lumpectomy with radioactive seed localization. We will do this at [facility] because of her pacemaker.        I discussed the indications, details, techniques, and numerous risk of the surgery with her. She is aware of the risk of bleeding, infection, cosmetic deformity, reoperation if this turns out to be cancer with a positive margin, nerve damage and chronic pain, pacemaker, cardiac , pulmonary complications. She understands these issues well. At this time all of her questions are answered. She agrees with this plan.   Other Problems  Atrial Fibrillation High blood pressure Hypercholesterolemia Thyroid Disease  Past Surgical History  Breast Biopsy Bilateral. Hysterectomy (not due to cancer) - Complete Oral Surgery Valve Replacement  Diagnostic Studies History  Colonoscopy within last year Mammogram within last year  Allergies  No Known Drug Allergies09/30/2016  Medication History  Folic Acid (1MG  Tablet, Oral) Active. Levothyroxine Sodium (75MCG Tablet, Oral) Active. Ramipril (10MG  Capsule, Oral) Active. Rosuvastatin Calcium (10MG  Tablet, Oral) Active. Triamterene-HCTZ (37.5-25MG  Tablet, Oral) Active. Sotalol HCl (80MG  Tablet, Oral) Active. Warfarin Sodium (5MG  Tablet, Oral) Active. Crestor (10MG  Tablet, Oral) Active. Coumadin (5MG  Tablet, Oral) Active. Medications Reconciled  Social History  Alcohol use Occasional alcohol use. Caffeine use Carbonated beverages, Tea. No drug use Tobacco use Former smoker.  Family History  Arthritis Mother. Hypertension Brother, Father.  Pregnancy / Birth History  Age at menarche 63 years. Gravida 0 Para 0    Review of Systems  General Not Present-  Appetite Loss, Chills, Fatigue, Fever, Night Sweats, Weight Gain and Weight Loss. Skin  Not Present- Change in Wart/Mole, Dryness, Hives, Jaundice, New Lesions, Non-Healing Wounds, Rash and Ulcer. HEENT Present- Wears glasses/contact lenses. Not Present- Earache, Hearing Loss, Hoarseness, Nose Bleed, Oral Ulcers, Ringing in the Ears, Seasonal Allergies, Sinus Pain, Sore Throat, Visual Disturbances and Yellow Eyes. Respiratory Present- Snoring. Not Present- Bloody sputum, Chronic Cough, Difficulty Breathing and Wheezing. Cardiovascular Not Present- Chest Pain, Difficulty Breathing Lying Down, Leg Cramps, Palpitations, Rapid Heart Rate, Shortness of Breath and Swelling of Extremities. Gastrointestinal Not Present- Abdominal Pain, Bloating, Bloody Stool, Change in Bowel Habits, Chronic diarrhea, Constipation, Difficulty Swallowing, Excessive gas, Gets full quickly at meals, Hemorrhoids, Indigestion, Nausea, Rectal Pain and Vomiting. Musculoskeletal Not Present- Back Pain, Joint Pain, Joint Stiffness, Muscle Pain, Muscle Weakness and Swelling of Extremities. Psychiatric Not Present- Anxiety, Bipolar, Change in Sleep Pattern, Depression, Fearful and Frequent crying. Hematology Present- Easy Bruising. Not Present- Excessive bleeding, Gland problems, HIV and Persistent Infections.  Vitals  Weight: 216 lb Height: 62in Body Surface Area: 2.07 m Body Mass Index: 39.51 kg/m  Temp.: 98.34F(Temporal)  Pulse: 64 (Regular)  BP: 118/80 (Sitting, Left Arm, Standard)     Physical Exam  General Mental Status-Alert. General Appearance-Consistent with stated age. Hydration-Well hydrated. Voice-Normal. Note: BMI 39.51   Head and Neck Head-normocephalic, atraumatic with no lesions or palpable masses. Trachea-midline. Thyroid Gland Characteristics - normal size and consistency.  Eye Eyeball - Bilateral-Extraocular movements intact. Sclera/Conjunctiva - Bilateral-No scleral icterus.  Chest and Lung Exam Chest and lung exam reveals -quiet, even and easy  respiratory effort with no use of accessory muscles and on auscultation, normal breath sounds, no adventitious sounds and normal vocal resonance. Inspection Chest Wall - Normal. Back - normal. Note: Pacemaker palpable left infraclavicular area. Transverse scar in that area. Median sternotomy incision well-healed.   Breast Note: breasts medium size. Small bruise right breast laterally. Small bruise left breast laterally. No masses. No other skin changes. No axillary adenopathy.   Cardiovascular Cardiovascular examination reveals -normal heart sounds, regular rate and rhythm with no murmurs and normal pedal pulses bilaterally. Note: Her pulse is regular. No ectopy. No murmur. Heart sounds distant.   Abdomen Inspection Inspection of the abdomen reveals - No Hernias. Skin - Scar - no surgical scars. Palpation/Percussion Palpation and Percussion of the abdomen reveal - Soft, Non Tender, No Rebound tenderness, No Rigidity (guarding) and No hepatosplenomegaly. Auscultation Auscultation of the abdomen reveals - Bowel sounds normal.  Neurologic Neurologic evaluation reveals -alert and oriented x 3 with no impairment of recent or remote memory. Mental Status-Normal.  Musculoskeletal Normal Exam - Left-Upper Extremity Strength Normal and Lower Extremity Strength Normal. Normal Exam - Right-Upper Extremity Strength Normal and Lower Extremity Strength Normal.  Lymphatic Head & Neck  General Head & Neck Lymphatics: Bilateral - Description - Normal. Axillary  General Axillary Region: Bilateral - Description - Normal. Tenderness - Non Tender. Femoral & Inguinal  Generalized Femoral & Inguinal Lymphatics: Bilateral - Description - Normal. Tenderness - Non Tender.    Assessment & Plan  PAPILLOMA OF BREAST, RIGHT (D24.1)  You are being scheduled for surgery - Our schedulers will call you. Your recent imaging studies and biopsy reports reveal multiple nodules, most of which  are benign. The only area of concern is in the right breast, upper outer quadrant where the biopsy shows an intraductal papilloma. There is a small but definite chance there may be early breast cancer in the area. The risk is probably  less than 10%, however You have stated that he would like to go ahead and have this area biopsied to clarify your diagnosis. This is very reasonable We will ask your cardiologist for clearance to proceed with surgery, and you will need to stop the Coumadin 5 days preop The surgery will be done at [facility] because of your pacemaker you will be scheduled for right breast lumpectomy with radioactive seed localization We have discussed the indications, techniques, and numerous risks of the surgery Please read the printed information that we gave you    PACEMAKER (Z95.0)  HYPERTENSION, BENIGN (I10)  HISTORY OF MITRAL VALVE REPAIR (Z98.890)  FAMILY HISTORY OF BR  ANTICOAGULATED ON COUMADIN (Z51.81)  PAROXYSMAL ATRIAL FIBRILLATION WITH RVR (I48.0)  BMI 39.0-39.9,ADULT (Z68.39)    Scout Gumbs M. Dalbert Batman, M.D., [facility] General and Minimally invasive Surgery Breast and Colorectal Surgery Office:   [phone] Pager:   [phone]

## 2014-11-22 ENCOUNTER — Ambulatory Visit (HOSPITAL_COMMUNITY): Payer: Medicare Other | Admitting: Certified Registered Nurse Anesthetist

## 2014-11-22 ENCOUNTER — Encounter (HOSPITAL_COMMUNITY): Payer: Self-pay | Admitting: Certified Registered Nurse Anesthetist

## 2014-11-22 ENCOUNTER — Encounter (HOSPITAL_COMMUNITY)
Admission: RE | Disposition: A | Discharge status: Home / Self Care | Payer: Self-pay | Source: Ambulatory Visit | Attending: General Surgery

## 2014-11-22 ENCOUNTER — Ambulatory Visit (HOSPITAL_COMMUNITY)
Admission: RE | Admit: 2014-11-22 | Discharge: 2014-11-22 | Disposition: A | Discharge status: Home / Self Care | Payer: Medicare Other | Source: Ambulatory Visit | Attending: General Surgery | Admitting: General Surgery

## 2014-11-22 DIAGNOSIS — I10 Essential (primary) hypertension: Secondary | ICD-10-CM | POA: Diagnosis not present

## 2014-11-22 DIAGNOSIS — D241 Benign neoplasm of right breast: Secondary | ICD-10-CM | POA: Diagnosis present

## 2014-11-22 DIAGNOSIS — Z79899 Other long term (current) drug therapy: Secondary | ICD-10-CM | POA: Insufficient documentation

## 2014-11-22 DIAGNOSIS — Z950 Presence of cardiac pacemaker: Secondary | ICD-10-CM | POA: Insufficient documentation

## 2014-11-22 DIAGNOSIS — Z87891 Personal history of nicotine dependence: Secondary | ICD-10-CM | POA: Insufficient documentation

## 2014-11-22 DIAGNOSIS — I480 Paroxysmal atrial fibrillation: Secondary | ICD-10-CM | POA: Insufficient documentation

## 2014-11-22 DIAGNOSIS — E119 Type 2 diabetes mellitus without complications: Secondary | ICD-10-CM | POA: Diagnosis not present

## 2014-11-22 DIAGNOSIS — E039 Hypothyroidism, unspecified: Secondary | ICD-10-CM | POA: Diagnosis not present

## 2014-11-22 DIAGNOSIS — Z6839 Body mass index (BMI) 39.0-39.9, adult: Secondary | ICD-10-CM | POA: Insufficient documentation

## 2014-11-22 DIAGNOSIS — Z7901 Long term (current) use of anticoagulants: Secondary | ICD-10-CM | POA: Diagnosis not present

## 2014-11-22 HISTORY — PX: BREAST LUMPECTOMY WITH RADIOACTIVE SEED LOCALIZATION: SHX6424

## 2014-11-22 LAB — PROTIME-INR
INR: 1.05 (ref 0.00–1.49)
Prothrombin Time: 13.9 seconds (ref 11.6–15.2)

## 2014-11-22 LAB — GLUCOSE, CAPILLARY
GLUCOSE-CAPILLARY: 85 mg/dL (ref 65–99)
Glucose-Capillary: 90 mg/dL (ref 65–99)

## 2014-11-22 LAB — APTT: APTT: 28 s (ref 24–37)

## 2014-11-22 SURGERY — BREAST LUMPECTOMY WITH RADIOACTIVE SEED LOCALIZATION
Anesthesia: General | Site: Breast | Laterality: Right

## 2014-11-22 MED ORDER — LACTATED RINGERS IV SOLN
INTRAVENOUS | Status: DC
  Administered 2014-11-22: 10:00:00 via INTRAVENOUS

## 2014-11-22 MED ORDER — ACETAMINOPHEN 160 MG/5ML PO SOLN
325.0000 mg | ORAL | Status: DC | PRN
  Filled 2014-11-22: qty 20.3

## 2014-11-22 MED ORDER — ACETAMINOPHEN 325 MG PO TABS
ORAL_TABLET | ORAL | Status: AC
  Filled 2014-11-22: qty 2

## 2014-11-22 MED ORDER — BUPIVACAINE-EPINEPHRINE 0.25% -1:200000 IJ SOLN
INTRAMUSCULAR | Status: DC | PRN
Start: 2014-11-22 — End: 2014-11-22
  Administered 2014-11-22: 10 mL

## 2014-11-22 MED ORDER — OXYCODONE HCL 5 MG PO TABS
5.0000 mg | ORAL_TABLET | Freq: Once | ORAL | Status: AC | PRN
  Administered 2014-11-22: 5 mg via ORAL

## 2014-11-22 MED ORDER — SODIUM CHLORIDE 0.9 % IJ SOLN: 3.0000 mL | Freq: Two times a day (BID) | INTRAMUSCULAR | Status: DC

## 2014-11-22 MED ORDER — ACETAMINOPHEN 650 MG RE SUPP: 650.0000 mg | RECTAL | Status: DC | PRN

## 2014-11-22 MED ORDER — LIDOCAINE HCL (CARDIAC) 20 MG/ML IV SOLN
INTRAVENOUS | Status: DC | PRN
  Administered 2014-11-22: 80 mg via INTRAVENOUS

## 2014-11-22 MED ORDER — ARTIFICIAL TEARS OP OINT
TOPICAL_OINTMENT | OPHTHALMIC | Status: AC
  Filled 2014-11-22: qty 3.5

## 2014-11-22 MED ORDER — FENTANYL CITRATE (PF) 250 MCG/5ML IJ SOLN
INTRAMUSCULAR | Status: AC
  Filled 2014-11-22: qty 5

## 2014-11-22 MED ORDER — SODIUM CHLORIDE 0.9 % IV SOLN: INTRAVENOUS | Status: DC

## 2014-11-22 MED ORDER — EPHEDRINE SULFATE 50 MG/ML IJ SOLN
INTRAMUSCULAR | Status: DC | PRN
  Administered 2014-11-22 (×2): 5 mg via INTRAVENOUS
  Administered 2014-11-22: 10 mg via INTRAVENOUS
  Administered 2014-11-22: 5 mg via INTRAVENOUS

## 2014-11-22 MED ORDER — LIDOCAINE HCL (CARDIAC) 20 MG/ML IV SOLN
INTRAVENOUS | Status: AC
  Filled 2014-11-22: qty 5

## 2014-11-22 MED ORDER — FENTANYL CITRATE (PF) 100 MCG/2ML IJ SOLN: 25.0000 ug | INTRAMUSCULAR | Status: DC | PRN

## 2014-11-22 MED ORDER — ONDANSETRON HCL 4 MG/2ML IJ SOLN
INTRAMUSCULAR | Status: DC | PRN
  Administered 2014-11-22: 4 mg via INTRAVENOUS

## 2014-11-22 MED ORDER — ACETAMINOPHEN 325 MG PO TABS
650.0000 mg | ORAL_TABLET | ORAL | Status: DC | PRN
Start: 2014-11-22 — End: 2014-11-22

## 2014-11-22 MED ORDER — HYDROCODONE-ACETAMINOPHEN 5-325 MG PO TABS: 1.0000 | ORAL_TABLET | Freq: Four times a day (QID) | ORAL | Status: DC | PRN

## 2014-11-22 MED ORDER — FENTANYL CITRATE (PF) 100 MCG/2ML IJ SOLN
25.0000 ug | INTRAMUSCULAR | Status: DC | PRN
  Administered 2014-11-22: 50 ug via INTRAVENOUS

## 2014-11-22 MED ORDER — PROPOFOL 10 MG/ML IV BOLUS
INTRAVENOUS | Status: DC | PRN
  Administered 2014-11-22: 200 mg via INTRAVENOUS

## 2014-11-22 MED ORDER — ONDANSETRON HCL 4 MG/2ML IJ SOLN
INTRAMUSCULAR | Status: AC
  Filled 2014-11-22: qty 2

## 2014-11-22 MED ORDER — ROCURONIUM BROMIDE 50 MG/5ML IV SOLN
INTRAVENOUS | Status: AC
  Filled 2014-11-22: qty 1

## 2014-11-22 MED ORDER — MIDAZOLAM HCL 2 MG/2ML IJ SOLN
INTRAMUSCULAR | Status: AC
  Filled 2014-11-22: qty 4

## 2014-11-22 MED ORDER — OXYCODONE HCL 5 MG/5ML PO SOLN: 5.0000 mg | Freq: Once | ORAL | Status: AC | PRN

## 2014-11-22 MED ORDER — ACETAMINOPHEN 325 MG PO TABS
325.0000 mg | ORAL_TABLET | ORAL | Status: DC | PRN
  Administered 2014-11-22: 650 mg via ORAL

## 2014-11-22 MED ORDER — OXYCODONE HCL 5 MG PO TABS
ORAL_TABLET | ORAL | Status: AC
  Filled 2014-11-22: qty 1

## 2014-11-22 MED ORDER — OXYCODONE HCL 5 MG PO TABS: 5.0000 mg | ORAL_TABLET | ORAL | Status: DC | PRN

## 2014-11-22 MED ORDER — 0.9 % SODIUM CHLORIDE (POUR BTL) OPTIME
TOPICAL | Status: DC | PRN
  Administered 2014-11-22: 1000 mL

## 2014-11-22 MED ORDER — SODIUM CHLORIDE 0.9 % IV SOLN: 250.0000 mL | INTRAVENOUS | Status: DC | PRN

## 2014-11-22 MED ORDER — FENTANYL CITRATE (PF) 100 MCG/2ML IJ SOLN
INTRAMUSCULAR | Status: DC | PRN
  Administered 2014-11-22 (×2): 50 ug via INTRAVENOUS

## 2014-11-22 MED ORDER — MIDAZOLAM HCL 5 MG/5ML IJ SOLN
INTRAMUSCULAR | Status: DC | PRN
  Administered 2014-11-22: 2 mg via INTRAVENOUS

## 2014-11-22 MED ORDER — SODIUM CHLORIDE 0.9 % IJ SOLN: 3.0000 mL | INTRAMUSCULAR | Status: DC | PRN

## 2014-11-22 MED ORDER — BUPIVACAINE-EPINEPHRINE (PF) 0.25% -1:200000 IJ SOLN
INTRAMUSCULAR | Status: AC
  Filled 2014-11-22: qty 30

## 2014-11-22 MED ORDER — FENTANYL CITRATE (PF) 100 MCG/2ML IJ SOLN
INTRAMUSCULAR | Status: AC
  Filled 2014-11-22: qty 2

## 2014-11-22 SURGICAL SUPPLY — 51 items
ADH SKN CLS APL DERMABOND .7 (GAUZE/BANDAGES/DRESSINGS) ×1
APPLIER CLIP 9.375 MED OPEN (MISCELLANEOUS)
APR CLP MED 9.3 20 MLT OPN (MISCELLANEOUS)
BINDER BREAST LRG (GAUZE/BANDAGES/DRESSINGS) IMPLANT
BINDER BREAST XLRG (GAUZE/BANDAGES/DRESSINGS) ×1 IMPLANT
BLADE SURG 15 STRL LF DISP TIS (BLADE) ×1 IMPLANT
BLADE SURG 15 STRL SS (BLADE) ×2
CANISTER SUCTION 2500CC (MISCELLANEOUS) ×1 IMPLANT
CHLORAPREP W/TINT 26ML (MISCELLANEOUS) ×2 IMPLANT
CLIP APPLIE 9.375 MED OPEN (MISCELLANEOUS) IMPLANT
COVER PROBE W GEL 5X96 (DRAPES) ×2 IMPLANT
COVER SURGICAL LIGHT HANDLE (MISCELLANEOUS) ×2 IMPLANT
DERMABOND ADVANCED (GAUZE/BANDAGES/DRESSINGS) ×1
DERMABOND ADVANCED .7 DNX12 (GAUZE/BANDAGES/DRESSINGS) ×1 IMPLANT
DEVICE DUBIN SPECIMEN MAMMOGRA (MISCELLANEOUS) ×2 IMPLANT
DRAPE CHEST BREAST 15X10 FENES (DRAPES) ×2 IMPLANT
DRAPE UTILITY XL STRL (DRAPES) ×1 IMPLANT
ELECT CAUTERY BLADE 6.4 (BLADE) ×2 IMPLANT
ELECT REM PT RETURN 9FT ADLT (ELECTROSURGICAL) ×2
ELECTRODE REM PT RTRN 9FT ADLT (ELECTROSURGICAL) ×1 IMPLANT
GLOVE BIO SURGEON STRL SZ7 (GLOVE) ×1 IMPLANT
GLOVE BIOGEL PI IND STRL 7.0 (GLOVE) IMPLANT
GLOVE BIOGEL PI INDICATOR 7.0 (GLOVE) ×2
GLOVE EUDERMIC 7 POWDERFREE (GLOVE) ×2 IMPLANT
GLOVE SS BIOGEL STRL SZ 7.5 (GLOVE) IMPLANT
GLOVE SUPERSENSE BIOGEL SZ 7.5 (GLOVE) ×1
GLOVE SURG SS PI 7.0 STRL IVOR (GLOVE) ×1 IMPLANT
GLOVE SURG SS PI 7.5 STRL IVOR (GLOVE) ×1 IMPLANT
GOWN STRL REUS W/ TWL LRG LVL3 (GOWN DISPOSABLE) ×1 IMPLANT
GOWN STRL REUS W/ TWL XL LVL3 (GOWN DISPOSABLE) ×1 IMPLANT
GOWN STRL REUS W/TWL LRG LVL3 (GOWN DISPOSABLE) ×4
GOWN STRL REUS W/TWL XL LVL3 (GOWN DISPOSABLE) ×2
KIT BASIN OR (CUSTOM PROCEDURE TRAY) ×2 IMPLANT
KIT MARKER MARGIN INK (KITS) ×2 IMPLANT
NDL HYPO 25X1 1.5 SAFETY (NEEDLE) ×1 IMPLANT
NEEDLE HYPO 25X1 1.5 SAFETY (NEEDLE) ×2 IMPLANT
NS IRRIG 1000ML POUR BTL (IV SOLUTION) ×1 IMPLANT
PACK SURGICAL SETUP 50X90 (CUSTOM PROCEDURE TRAY) ×2 IMPLANT
PAD ABD 8X10 STRL (GAUZE/BANDAGES/DRESSINGS) ×1 IMPLANT
PENCIL BUTTON HOLSTER BLD 10FT (ELECTRODE) ×2 IMPLANT
SPONGE LAP 18X18 X RAY DECT (DISPOSABLE) ×2 IMPLANT
SUT MNCRL AB 4-0 PS2 18 (SUTURE) ×2 IMPLANT
SUT SILK 2 0 FS (SUTURE) ×1 IMPLANT
SUT SILK 2 0 SH (SUTURE) ×1 IMPLANT
SUT VIC AB 3-0 SH 18 (SUTURE) ×2 IMPLANT
SYR BULB 3OZ (MISCELLANEOUS) ×2 IMPLANT
SYR CONTROL 10ML LL (SYRINGE) ×2 IMPLANT
TOWEL OR 17X24 6PK STRL BLUE (TOWEL DISPOSABLE) ×1 IMPLANT
TOWEL OR 17X26 10 PK STRL BLUE (TOWEL DISPOSABLE) ×2 IMPLANT
TUBE CONNECTING 12X1/4 (SUCTIONS) ×2 IMPLANT
YANKAUER SUCT BULB TIP NO VENT (SUCTIONS) ×2 IMPLANT

## 2014-11-22 NOTE — Anesthesia Procedure Notes (Signed)
Procedure Name: LMA Insertion Date/Time: 11/22/2014 10:44 AM Performed by: Willeen Cass P Pre-anesthesia Checklist: Patient identified, Timeout performed, Emergency Drugs available, Patient being monitored and Suction available Patient Re-evaluated:Patient Re-evaluated prior to inductionOxygen Delivery Method: Circle system utilized Preoxygenation: Pre-oxygenation with 100% oxygen Intubation Type: IV induction Ventilation: Mask ventilation without difficulty LMA: LMA inserted LMA Size: 4.0 Number of attempts: 1 Placement Confirmation: breath sounds checked- equal and bilateral and positive ETCO2 Tube secured with: Tape Dental Injury: Teeth and Oropharynx as per pre-operative assessment

## 2014-11-22 NOTE — Anesthesia Preprocedure Evaluation (Addendum)
Anesthesia Evaluation  Patient identified by MRN, date of birth, ID band Patient awake    Reviewed: Allergy & Precautions, NPO status , Patient's Chart, lab work & pertinent test results  History of Anesthesia Complications Negative for: history of anesthetic complications  Airway Mallampati: II  TM Distance: >3 FB Neck ROM: Full    Dental  (+) Teeth Intact   Pulmonary neg shortness of breath, neg sleep apnea, neg COPD, neg recent URI, former smoker,    breath sounds clear to auscultation       Cardiovascular hypertension, Pt. on medications (-) angina(-) Past MI and (-) CHF + dysrhythmias Atrial Fibrillation + pacemaker  Rhythm:Regular     Neuro/Psych negative neurological ROS  negative psych ROS   GI/Hepatic negative GI ROS, Neg liver ROS,   Endo/Other  diabetesHypothyroidism Morbid obesity  Renal/GU negative Renal ROS     Musculoskeletal  (+) Arthritis ,   Abdominal   Peds  Hematology negative hematology ROS (+)   Anesthesia Other Findings   Reproductive/Obstetrics                           Anesthesia Physical Anesthesia Plan  ASA: II  Anesthesia Plan: General   Post-op Pain Management:    Induction: Intravenous  Airway Management Planned: LMA  Additional Equipment: None  Intra-op Plan:   Post-operative Plan: Extubation in OR  Informed Consent: I have reviewed the patients History and Physical, chart, labs and discussed the procedure including the risks, benefits and alternatives for the proposed anesthesia with the patient or authorized representative who has indicated his/her understanding and acceptance.   Dental advisory given  Plan Discussed with: CRNA and Surgeon  Anesthesia Plan Comments:         Anesthesia Quick Evaluation

## 2014-11-22 NOTE — Op Note (Signed)
Patient Name:           [name]   Date of Surgery:        11/22/2014  Pre op Diagnosis:     Papilloma of right breast   Post op Diagnosis:    Same  Procedure:                 Right breast lumpectomy with radioactive seed localization  Surgeon:                     Edsel Petrin. Dalbert Batman, M.D., FACS  Assistant:                      OR staff  Operative Indications:   The patient is a 69 year old female who presents with a breast mass. She is referred by Dr. Gae Dry for evaluation and management of an intraductal papilloma of the right breast at the 10 o'clock position. Dr. M.Croitoru is her cardiologist. Larence Penning, NP provides primary care. The patient has not had any significant breast disease in the past. She gets mammograms periodically. Recent mammograms show several nodules and cysts.  In the right breast there was a 9 mm lobulated mass at the 10 o'clock position which was felt to be suspicious. Biopsy showed intraductal papilloma and usual ductal hyperplasia. There was a second small mass in the left breast 4:00 which was suspicious. Biopsy showed fibrocystic disease and usual ductal hyperplasia. She has done well following the biopsy. 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 Sick sinus syndrome. Anticoagulated on Coumadin. Hypertension. History of sternotomy for mitral valve repair by Dr. Pia Mau. Obesity with BMI 39. Family history reveals breast cancer, in one aunt, premenopausal.   We had a long talk about the low but definite risk of noninvasive cancer in this area. We talked about excisional biopsy which the standard of care at this time versus close observation. She clearly wants to go ahead and get this area excised to clarify her diagnosis.  She has not taken Coumadin for 5 days.  She is brought to the operating room electively   Operative Findings:       The  radioactive seed was audible using the neoprobe at the 9:00 position of the right breast.  The specimen mammogram looked very good with the marker clip and seed in the center of the specimen.  Procedure in Detail:          The radioactive seed was placed by the radiologist last week.  I could hear the audible signal of the radioactive seed using the neoprobe preoperatively.  The patient was taken the operating room and underwent general anesthesia with LMA device.  The right breast was prepped and draped in a sterile fashion.  Surgical timeout was performed.  Intravenous antibiotics were given.  0.5% Marcaine with epinephrine was used as a local infiltration anesthetic.     Using the neoprobe as a guide I planned and made a radially oriented incision in the right breast at the 9:00 position.  Using the neoprobe frequently I dissected down into the breast tissue around the radioactive signal.  The specimen was removed and marked with silk sutures and a 6 color ink kit to orient the pathologist.  The specimen mammogram looked very good as described above and  the specimen was sent to the lab.  Hemostasis was excellent and achieved with electrocautery.  The wound was  irrigated with saline.  The breast tissues were closed with interrupted sutures of 3-0 Vicryl and the skin closed with a running subcuticular 4-0 Monocryl and Dermabond.  Breast binder was placed in the patient taken to PACU in stable condition.  EBL 10 mL.  Counts correct.  Complications none.     Edsel Petrin. Dalbert Batman, M.D., FACS General and Minimally Invasive Surgery Breast and Colorectal Surgery  11/22/2014 11:30 AM

## 2014-11-22 NOTE — Transfer of Care (Signed)
Immediate Anesthesia Transfer of Care Note  Patient: [name]  Procedure(s) Performed: Procedure(s): RIGHT BREAST LUMPECTOMY WITH RADIOACTIVE SEED LOCALIZATION (Right)  Patient Location: PACU  Anesthesia Type:General  Level of Consciousness: awake, alert , oriented and patient cooperative  Airway & Oxygen Therapy: Patient Spontanous Breathing and Patient connected to nasal cannula oxygen  Post-op Assessment: Report given to RN and Post -op Vital signs reviewed and stable  Post vital signs: Reviewed and stable  Last Vitals:  Filed Vitals:   11/22/14 1133  BP: 125/52  Pulse: 61  Temp: 36.7 C  Resp: 18    Complications: No apparent anesthesia complications

## 2014-11-22 NOTE — Anesthesia Postprocedure Evaluation (Signed)
  Anesthesia Post-op Note  Patient: [name]  Procedure(s) Performed: Procedure(s): RIGHT BREAST LUMPECTOMY WITH RADIOACTIVE SEED LOCALIZATION (Right)  Patient Location: PACU  Anesthesia Type: General   Level of Consciousness: awake, alert  and oriented  Airway and Oxygen Therapy: Patient Spontanous Breathing  Post-op Pain: mild  Post-op Assessment: Post-op Vital signs reviewed  Post-op Vital Signs: Reviewed  Last Vitals:  Filed Vitals:   11/22/14 1315  BP: 118/56  Pulse: 54  Temp:   Resp: 16    Complications: No apparent anesthesia complications

## 2014-11-22 NOTE — Discharge Instructions (Signed)
Restart all of your usual medications today, except for Coumadin and aspirin.  Restart your Coumadin and aspirin tomorrow     [employer] Phone Number [phone]  BREAST BIOPSY/ PARTIAL MASTECTOMY: POST OP INSTRUCTIONS  Always review your discharge instruction sheet given to you by the facility where your surgery was performed.  IF YOU HAVE DISABILITY OR FAMILY LEAVE FORMS, YOU MUST BRING THEM TO THE OFFICE FOR PROCESSING.  DO NOT GIVE THEM TO YOUR DOCTOR.  1. A prescription for pain medication may be given to you upon discharge.  Take your pain medication as prescribed, if needed.  If narcotic pain medicine is not needed, then you may take acetaminophen (Tylenol) or ibuprofen (Advil) as needed. 2. Take your usually prescribed medications unless otherwise directed 3. If you need a refill on your pain medication, please contact your pharmacy.  They will contact our office to request authorization.  Prescriptions will not be filled after 5pm or on week-ends. 4. You should eat very light the first 24 hours after surgery, such as soup, crackers, pudding, etc.  Resume your normal diet the day after surgery. 5. Most patients will experience some swelling and bruising in the breast.  Ice packs and a good support bra will help.  Swelling and bruising can take several days to resolve.  6. It is common to experience some constipation if taking pain medication after surgery.  Increasing fluid intake and taking a stool softener will usually help or prevent this problem from occurring.  A mild laxative (Milk of Magnesia or Miralax) should be taken according to package directions if there are no bowel movements after 48 hours. 7. Unless discharge instructions indicate otherwise, you may remove your bandages 24-48 hours after surgery, and you may shower at that time.  You may have steri-strips (small skin tapes) in place directly over the incision.  These strips should be left on the  skin for 7-10 days.  If your surgeon used skin glue on the incision, you may shower in 24 hours.  The glue will flake off over the next 2-3 weeks.  Any sutures or staples will be removed at the office during your follow-up visit. 8. ACTIVITIES:  You may resume regular daily activities (gradually increasing) beginning the next day.  Wearing a good support bra or sports bra minimizes pain and swelling.  You may have sexual intercourse when it is comfortable. a. You may drive when you no longer are taking prescription pain medication, you can comfortably wear a seatbelt, and you can safely maneuver your car and apply brakes. b. RETURN TO WORK:  ______________________________________________________________________________________ 9. You should see your doctor in the office for a follow-up appointment approximately two weeks after your surgery.  Your doctors nurse will typically make your follow-up appointment when she calls you with your pathology report.  Expect your pathology report 2-3 business days after your surgery.  You may call to check if you do not hear from [place] after three days. 10. OTHER INSTRUCTIONS: _______________________________________________________________________________________________ _____________________________________________________________________________________________________________________________________ _____________________________________________________________________________________________________________________________________ _____________________________________________________________________________________________________________________________________  WHEN TO CALL YOUR DOCTOR: 1. Fever over 101.0 2. Nausea and/or vomiting. 3. Extreme swelling or bruising. 4. Continued bleeding from incision. 5. Increased pain, redness, or drainage from the incision.  The clinic staff is available to answer your questions during regular business hours.  Please dont  hesitate to call and ask to speak to one of the nurses for clinical concerns.  If you have a medical emergency, go to the nearest emergency room or call  [phone].  A surgeon from [facility] is always on call at the hospital.  For further questions, please visit [email or web address]

## 2014-11-22 NOTE — Interval H&P Note (Signed)
History and Physical Interval Note:  11/22/2014 9:47 AM  [name]  has presented today for surgery, with the diagnosis of papilloma right breast  The various methods of treatment have been discussed with the patient and family. After consideration of risks, benefits and other options for treatment, the patient has consented to  Procedure(s): RIGHT BREAST LUMPECTOMY WITH RADIOACTIVE SEED LOCALIZATION (Right) as a surgical intervention .  The patient's history has been reviewed, patient examined, no change in status, stable for surgery.  I have reviewed the patient's chart and labs.  Questions were answered to the patient's satisfaction.     Adin Hector

## 2014-11-23 ENCOUNTER — Encounter (HOSPITAL_COMMUNITY): Payer: Self-pay | Admitting: General Surgery

## 2014-11-24 NOTE — Progress Notes (Signed)
Quick Note:  Inform patient of Pathology report,. Tell her that the breast biopsy shows a papilloma and some atypical cells but there is no cancer and she will not need any further surgery. I will discuss this with her in detail at the next office visit.  hmi ______

## 2014-12-04 ENCOUNTER — Other Ambulatory Visit: Payer: Self-pay | Admitting: Cardiovascular Disease

## 2014-12-05 ENCOUNTER — Other Ambulatory Visit: Payer: Self-pay | Admitting: Cardiovascular Disease

## 2014-12-09 ENCOUNTER — Ambulatory Visit (INDEPENDENT_AMBULATORY_CARE_PROVIDER_SITE_OTHER): Payer: Medicare Other | Admitting: Pharmacist Clinician (PhC)/ Clinical Pharmacy Specialist

## 2014-12-09 DIAGNOSIS — I4891 Unspecified atrial fibrillation: Secondary | ICD-10-CM

## 2014-12-09 DIAGNOSIS — I480 Paroxysmal atrial fibrillation: Secondary | ICD-10-CM | POA: Diagnosis not present

## 2014-12-09 DIAGNOSIS — Z7901 Long term (current) use of anticoagulants: Secondary | ICD-10-CM

## 2014-12-09 LAB — POCT INR: INR: 2.3

## 2014-12-11 ENCOUNTER — Other Ambulatory Visit: Payer: Self-pay | Admitting: Cardiovascular Disease

## 2014-12-28 ENCOUNTER — Other Ambulatory Visit: Payer: Self-pay

## 2014-12-28 MED ORDER — FOLIC ACID 1 MG PO TABS: 1.0000 mg | ORAL_TABLET | Freq: Every day | ORAL | Status: DC

## 2014-12-28 NOTE — Telephone Encounter (Signed)
Rx has been sent to pharmacy for refill 

## 2015-01-06 ENCOUNTER — Ambulatory Visit (INDEPENDENT_AMBULATORY_CARE_PROVIDER_SITE_OTHER): Payer: Medicare Other | Admitting: Pharmacist Clinician (PhC)/ Clinical Pharmacy Specialist

## 2015-01-06 DIAGNOSIS — I4891 Unspecified atrial fibrillation: Secondary | ICD-10-CM | POA: Diagnosis not present

## 2015-01-06 DIAGNOSIS — I480 Paroxysmal atrial fibrillation: Secondary | ICD-10-CM | POA: Diagnosis not present

## 2015-01-06 DIAGNOSIS — Z7901 Long term (current) use of anticoagulants: Secondary | ICD-10-CM | POA: Diagnosis not present

## 2015-01-06 LAB — POCT INR: INR: 2

## 2015-01-26 ENCOUNTER — Other Ambulatory Visit: Payer: Self-pay | Admitting: Cardiovascular Disease

## 2015-01-26 NOTE — Telephone Encounter (Signed)
Rx has been sent to the pharmacy electronically. ° °

## 2015-02-03 ENCOUNTER — Ambulatory Visit (INDEPENDENT_AMBULATORY_CARE_PROVIDER_SITE_OTHER): Payer: Medicare Other | Admitting: Pharmacist Clinician (PhC)/ Clinical Pharmacy Specialist

## 2015-02-03 DIAGNOSIS — Z7901 Long term (current) use of anticoagulants: Secondary | ICD-10-CM | POA: Diagnosis not present

## 2015-02-03 DIAGNOSIS — I480 Paroxysmal atrial fibrillation: Secondary | ICD-10-CM

## 2015-02-03 LAB — POCT INR: INR: 2.1

## 2015-02-14 ENCOUNTER — Telehealth: Payer: Self-pay | Admitting: Cardiology

## 2015-02-14 ENCOUNTER — Ambulatory Visit (INDEPENDENT_AMBULATORY_CARE_PROVIDER_SITE_OTHER): Payer: Medicare Other | Admitting: *Deleted

## 2015-02-14 DIAGNOSIS — I480 Paroxysmal atrial fibrillation: Secondary | ICD-10-CM | POA: Diagnosis not present

## 2015-02-14 NOTE — Telephone Encounter (Signed)
LMOVM reminding pt to send remote transmission.   

## 2015-02-15 ENCOUNTER — Encounter: Payer: Self-pay | Admitting: Cardiology

## 2015-02-17 ENCOUNTER — Other Ambulatory Visit: Payer: Self-pay | Admitting: Cardiovascular Disease

## 2015-02-20 ENCOUNTER — Other Ambulatory Visit: Payer: Self-pay | Admitting: *Deleted

## 2015-02-20 MED ORDER — TRIAMTERENE-HCTZ 37.5-25 MG PO TABS: 0.5000 | ORAL_TABLET | Freq: Every day | ORAL | Status: DC

## 2015-02-20 NOTE — Telephone Encounter (Signed)
REFILL 

## 2015-02-21 ENCOUNTER — Telehealth: Payer: Self-pay | Admitting: Cardiovascular Disease

## 2015-02-21 NOTE — Telephone Encounter (Signed)
New Message  Pt stated sent remote transmission on 1/18- wanted to know if we received signal.Please call back and discuss.

## 2015-02-21 NOTE — Telephone Encounter (Signed)
LMOM explaining that transmission was received 02/15/15 and next transmission is scheduled 05/10/15.

## 2015-02-24 NOTE — Progress Notes (Signed)
Remote pacemaker transmission.   

## 2015-02-28 LAB — CUP PACEART REMOTE DEVICE CHECK
Brady Statistic AP VP Percent: 26 %
Brady Statistic AS VP Percent: 73 %
Brady Statistic AS VS Percent: 0 %
Date Time Interrogation Session: 20170118202022
Implantable Lead Implant Date: 20060116
Implantable Lead Location: 753859
Implantable Lead Model: 5071
Lead Channel Impedance Value: 444 Ohm
Lead Channel Setting Pacing Amplitude: 2.5 V
Lead Channel Setting Sensing Sensitivity: 4 mV
MDC IDC LEAD IMPLANT DT: 20060109
MDC IDC LEAD LOCATION: 753858
MDC IDC MSMT BATTERY IMPEDANCE: 206 Ohm
MDC IDC MSMT BATTERY REMAINING LONGEVITY: 108 mo
MDC IDC MSMT BATTERY VOLTAGE: 2.8 V
MDC IDC MSMT LEADCHNL RV IMPEDANCE VALUE: 496 Ohm
MDC IDC SET LEADCHNL RA PACING AMPLITUDE: 2 V
MDC IDC SET LEADCHNL RV PACING PULSEWIDTH: 0.4 ms
MDC IDC STAT BRADY AP VS PERCENT: 0 %

## 2015-03-01 ENCOUNTER — Telehealth: Payer: Self-pay | Admitting: *Deleted

## 2015-03-01 ENCOUNTER — Encounter: Payer: Self-pay | Admitting: Cardiology

## 2015-03-01 DIAGNOSIS — E039 Hypothyroidism, unspecified: Secondary | ICD-10-CM | POA: Diagnosis present

## 2015-03-01 DIAGNOSIS — Z79899 Other long term (current) drug therapy: Secondary | ICD-10-CM

## 2015-03-01 NOTE — Telephone Encounter (Signed)
-----   Message from Sanda Klein, MD sent at 03/01/2015  8:12 AM EST ----- Remote reviewed.   Not pacemaker dependent. Battery status is good. Lead measurements are stable. Heart rate histogram is favorable. Overall atrial fibrillation burden has increased.  Will check BMET and Mg.

## 2015-03-01 NOTE — Telephone Encounter (Signed)
Remote transmission results called to patient.  She has not felt any palps or SOB to indicate there was a problem.  Will go to [facility].  Order placed.

## 2015-03-02 LAB — BASIC METABOLIC PANEL
BUN: 14 mg/dL (ref 7–25)
CALCIUM: 9.2 mg/dL (ref 8.6–10.4)
CO2: 26 mmol/L (ref 20–31)
CREATININE: 0.89 mg/dL (ref 0.50–0.99)
Chloride: 105 mmol/L (ref 98–110)
Glucose, Bld: 102 mg/dL — ABNORMAL HIGH (ref 65–99)
Potassium: 4.4 mmol/L (ref 3.5–5.3)
Sodium: 142 mmol/L (ref 135–146)

## 2015-03-02 LAB — MAGNESIUM: MAGNESIUM: 2 mg/dL (ref 1.5–2.5)

## 2015-03-15 ENCOUNTER — Encounter: Payer: Self-pay | Admitting: Cardiology

## 2015-03-17 ENCOUNTER — Ambulatory Visit (INDEPENDENT_AMBULATORY_CARE_PROVIDER_SITE_OTHER): Payer: Medicare Other | Admitting: Pharmacist Clinician (PhC)/ Clinical Pharmacy Specialist

## 2015-03-17 VITALS — BP 134/86 | HR 76

## 2015-03-17 DIAGNOSIS — I4891 Unspecified atrial fibrillation: Secondary | ICD-10-CM

## 2015-03-17 DIAGNOSIS — Z7901 Long term (current) use of anticoagulants: Secondary | ICD-10-CM | POA: Diagnosis not present

## 2015-03-17 DIAGNOSIS — I480 Paroxysmal atrial fibrillation: Secondary | ICD-10-CM | POA: Diagnosis not present

## 2015-03-17 LAB — POCT INR: INR: 2.1

## 2015-04-25 ENCOUNTER — Other Ambulatory Visit: Payer: Self-pay | Admitting: Cardiovascular Disease

## 2015-04-25 NOTE — Telephone Encounter (Signed)
Rx refill sent to pharmacy. 

## 2015-04-28 ENCOUNTER — Ambulatory Visit (INDEPENDENT_AMBULATORY_CARE_PROVIDER_SITE_OTHER): Payer: Medicare Other | Admitting: Pharmacist Clinician (PhC)/ Clinical Pharmacy Specialist

## 2015-04-28 DIAGNOSIS — Z7901 Long term (current) use of anticoagulants: Secondary | ICD-10-CM

## 2015-04-28 DIAGNOSIS — I480 Paroxysmal atrial fibrillation: Secondary | ICD-10-CM

## 2015-04-28 DIAGNOSIS — I4891 Unspecified atrial fibrillation: Secondary | ICD-10-CM

## 2015-04-28 LAB — POCT INR: INR: 2.4

## 2015-05-23 ENCOUNTER — Ambulatory Visit (INDEPENDENT_AMBULATORY_CARE_PROVIDER_SITE_OTHER): Payer: Medicare Other | Admitting: Cardiovascular Disease

## 2015-05-23 ENCOUNTER — Ambulatory Visit (INDEPENDENT_AMBULATORY_CARE_PROVIDER_SITE_OTHER): Payer: Medicare Other | Admitting: Pharmacist Clinician (PhC)/ Clinical Pharmacy Specialist

## 2015-05-23 VITALS — BP 127/79 | HR 80 | Ht 63.0 in | Wt 213.4 lb

## 2015-05-23 DIAGNOSIS — I442 Atrioventricular block, complete: Secondary | ICD-10-CM | POA: Diagnosis not present

## 2015-05-23 DIAGNOSIS — Z950 Presence of cardiac pacemaker: Secondary | ICD-10-CM | POA: Diagnosis not present

## 2015-05-23 DIAGNOSIS — I480 Paroxysmal atrial fibrillation: Secondary | ICD-10-CM

## 2015-05-23 DIAGNOSIS — Z7901 Long term (current) use of anticoagulants: Secondary | ICD-10-CM | POA: Diagnosis not present

## 2015-05-23 DIAGNOSIS — Z9889 Other specified postprocedural states: Secondary | ICD-10-CM | POA: Diagnosis not present

## 2015-05-23 DIAGNOSIS — I4891 Unspecified atrial fibrillation: Secondary | ICD-10-CM | POA: Diagnosis not present

## 2015-05-23 DIAGNOSIS — I5042 Chronic combined systolic (congestive) and diastolic (congestive) heart failure: Secondary | ICD-10-CM

## 2015-05-23 LAB — POCT INR: INR: 2.2

## 2015-05-23 NOTE — Progress Notes (Signed)
Patient ID: [name], female   DOB: [birth year], 70 y.o.   MRN: [id number]    Cardiology Office Note    Date:  05/25/2015   ID:  [name], DOB Mar 14, 1945, MRN [id number]  PCP:  Pcp Not In System  Cardiologist:   Sanda Klein, MD   Chief Complaint  Patient presents with  . Follow-up    6 Months, pt denied chest pain and swelling in legs and feet    History of Present Illness:  [name] is a 70 y.o. female status post mitral valve annuloplasty, with sinus node dysfunction, high-grade AV block, paroxysmal atrial fibrillation on warfarin, here for clinical and pacemaker followup  She had uncomplicated breast surgery for a breast papilloma.  Interrogation of her pacemaker today shows normal function. Estimated generator longevity is 8.5 years. She has25% atrial pacing with a good heart rate distribution. She has complete heart block with a very slow ventricular escape rhythm at 30 beats per minute. She has 99.7% ventricular pacing. The burden of atrial fibrillation is 3.2%. The longest episode of recent atrial fibrillation lasted for about 5 hours February 2017. Ventricular rate control was good. She has not had meaningful ventricular arrhythmia.   She describes mild exertional dyspnea (one flight of stairs) and has occasional wheezing when shelies down, but does not describe true orthopnea.At home, her BP is 110/70-136/85/  [name] had mitral valve annuloplasty for severe mitral insufficiency in 2006. She did not have coronary disease by preoperative angiography and has normal left ventricular systolic function. Followup echocardiography 09/30/2013 shows borderline low left ventricular ejection fraction (45-50%). Valve repair looks good. 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 The right atrial lead is a conventional transvenous lead. The ventricular lead is an active fixation  epicardial lead on the surface of the left ventricle placed at the time of her surgery. A generator change was performed in 2014. Her pacemaker is located in a deep subpectoral pocket. She has paroxysmal atrial fibrillation. She has treated hypertension, dyslipidemia and type 2 diabetes mellitus on the background of severe obesity.   Past Medical History  Diagnosis Date  . Mitral valve insufficiency     Recieved Mitral valve repair with reconstruction of the anterior leaflet cordis with cortex and ring annuloplasty  . Dyslipidemia     takes Niacin and Crestor daily  . Atrial fibrillation ([place])     PAF---currently anticoagulated with Warfarin  . RBBB     Due the VP with the LV epicardial lead that was inserted in 2006 during the mitral repair  . Congenital heart block 08/26/2003    Diagnosed by Dr. Clovis Fredrickson. A temp orary pacemaker was placed in 1988 during hysterectomy procedure.  . Atrial flutter ([place]) 02/21/2004    s/p mitral valve repair and ppm insertion  . Nonischemic cardiomyopathy ([place]) 2006    EF of 35% due to AV dyssyncrony. Has since resolved per most recent ECHO.  Marland Kitchen Hypothyroidism     takes Synthroid daily  . Systemic hypertension     takes  Ramipril daily-Maxzide every other day  . Arthritis   . Diverticulosis   . Urinary urgency   . History of blood transfusion     no abnormal reaction noted  . Diabetes mellitus without complication ([place])     prediabetic    Past Surgical History  Procedure Laterality Date  . Mitral valve annuloplasty  02/06/2004    Performed  for severe MR requiring mitral valve repair with anterior leaflet reconstruction and a [place] annuloplasty ring--model: [id number], and an 80% lesion reduction (Dr. Servando Snare). Two LV leads were placed epicardially just in the event that biv pacing would be needed. (One of the leads had been used for during the ppm insertion recieved one week later)   . Permanent pacemaker insertion   02/13/2004    PPM implant following a mitral valve annuloplasty. An epicardial active fixation lead (implanted on 02/06/2004 during the mitral valve procedure) was used for this implant.   . Cardiovascular stress test  09/14/2003    Performed for the pt's congenital CHB that was discovered during a preoperative evaluation for a colonoscopy.  EKG demonstrated CHB with junctional escape. Nonspecific ST changes noted. Stress ECG was positive for ischemia(2 mm ST depression). Dilated LV cavity. Inability to calculate systolic function. High risk scan. This scan was followed up with a cardiac cath.  . Cardiac catheterization  10/20/2003    Minimal decrease in the LV systolic EF of [id number] with global hyperkinesis. Moderately dilated LV. 3+ Mitral regurgitation. Underfilling of LAD which promptly improved with intracoronary NTG administration. Microvascular angina or microvascular spasm. Otherwise coronaries were normal. No intervention necessary.  . US echocardiography  09/10/2010 and1/07/2008    The last echo showed an EF of >55%; RV mildly dilated. Mild to moderately dilated LA  with borderline RA dilation as well. Impaired diastolic function. Normal RV systolic function.MVR without residual insufficiency.No Pericardial effusion.  Marland Kitchen Permanent pacemaker generator change  07/06/2012    Medtronic  . Permanent pacemaker generator change N/A 07/06/2012    Procedure: PERMANENT PACEMAKER GENERATOR CHANGE;  Surgeon: Sanda Klein, MD;  Location: [facility];  Service: Cardiovascular;  Laterality: N/A;  . Colonoscopy    . Abdominal hysterectomy    . Breast lumpectomy with radioactive seed localization Right 11/22/2014    Procedure: RIGHT BREAST LUMPECTOMY WITH RADIOACTIVE SEED LOCALIZATION;  Surgeon: Fanny Skates, MD;  Location: [facility];  Service: General;  Laterality: Right;    Current Medications: Outpatient Prescriptions Prior to Visit  Medication Sig Dispense Refill  . acetaminophen (TYLENOL) 325 MG tablet Take  650 mg by mouth as needed for pain.    Marland Kitchen aspirin 81 MG tablet Take 81 mg by mouth daily.    . cholecalciferol (VITAMIN D) 400 UNITS TABS Take 400 Units by mouth daily.    . folic acid (FOLVITE) 1 MG tablet Take 1 tablet (1 mg total) by mouth daily. 90 tablet 3  . HYDROcodone-acetaminophen (NORCO) 5-325 MG tablet Take 1-2 tablets by mouth every 6 (six) hours as needed for moderate pain or severe pain. 30 tablet 0  . levothyroxine (SYNTHROID, LEVOTHROID) 75 MCG tablet Take 75 mcg by mouth daily before breakfast.    . niacin 500 MG tablet Take 500 mg by mouth every evening.     . Omega-3 Fatty Acids (OMEGA 3 PO) Take 1,000 mg by mouth daily.    . rosuvastatin (CRESTOR) 10 MG tablet Take 1 tablet (10 mg total) by mouth daily. 28 tablet 0  . sotalol (BETAPACE) 80 MG tablet TAKE 1 TABLET BY MOUTH TWICE DAILY 60 tablet 6  . triamterene-hydrochlorothiazide (MAXZIDE-25) 37.5-25 MG tablet Take 0.5 tablets by mouth daily. 15 tablet 6  . warfarin (COUMADIN) 5 MG tablet Take 5-7.5 mg by mouth See admin instructions. Take 1&1/2 tab (7.5mg ) once daily except Take 1 tab (5mg ) only on Monday, Wednesday, and Friday    . ramipril (ALTACE)  10 MG capsule TAKE 1 CAPSULE BY MOUTH AT BEDTIME 30 capsule 0  . warfarin (COUMADIN) 5 MG tablet TAKE 1 AND 1/2 TABLETS BY MOUTH DAILY AS DIRECTED 135 tablet 1   No facility-administered medications prior to visit.     Allergies:   Review of patient's allergies indicates no known allergies.   Social History   Social History  . Marital Status: Single    Spouse Name: N/A  . Number of Children: N/A  . Years of Education: N/A   Social History Main Topics  . Smoking status: Former Smoker    Types: Cigarettes  . Smokeless tobacco: Not on file     Comment: quit smoking in 2006  . Alcohol Use: Yes     Comment: wine occasionally  . Drug Use: No  . Sexual Activity: Yes    Birth Control/ Protection: Surgical   Other Topics Concern  . Not on file   Social History  Narrative     Family History:  The patient's family history includes Asthma in her brother and sister; Cancer - Other in her mother; Healthy in her brother, brother, brother, and brother; Pneumonia in her father.   ROS:   Please see the history of present illness.    ROS All other systems reviewed and are negative.   PHYSICAL EXAM:   VS:  BP 127/79 mmHg  Pulse 80  Ht 5\' 3"  (1.6 m)  Wt 96.798 kg (213 lb 6.4 oz)  BMI 37.81 kg/m2   GEN: Well nourished, well developed, in no acute distress HEENT: normal Neck: no JVD, carotid bruits, or masses Cardiac: paradoxically split S2, RRR; no murmurs, rubs, or gallops,no edema  Respiratory:  clear to auscultation bilaterally, normal work of breathing GI: soft, nontender, nondistended, + BS MS: no deformity or atrophy Skin: warm and dry, no rash Neuro:  Alert and Oriented x 3, Strength and sensation are intact Psych: euthymic mood, full affect  Wt Readings from Last 3 Encounters:  05/23/15 96.798 kg (213 lb 6.4 oz)  11/22/14 95.255 kg (210 lb)  11/16/14 95.528 kg (210 lb 9.6 oz)      Studies/Labs Reviewed:   EKG:  EKG is ordered today.  The ekg ordered today demonstrates A pace V paced rhythm. QTC 565 ms (was 553 ms last year)  Recent Labs: 11/16/2014: ALT 19; Hemoglobin 13.0; Platelets 285 03/01/2015: BUN 14; Creat 0.89; Magnesium 2.0; Potassium 4.4; Sodium 142   Lipid Panel No results found for: CHOL, TRIG, HDL, CHOLHDL, VLDL, LDLCALC, LDLDIRECT    ASSESSMENT:    1. PAF (paroxysmal atrial fibrillation) ([place])   2. Chronic combined systolic and diastolic heart failure ([place])   3. Complete heart block ([place])   4. Pacemaker   5. H/O mitral valve repair   6. Long term (current) use of anticoagulants      PLAN:  In order of problems listed above:  1. Afib - Low (3%) atrial fibrillation burden. Not apparently symptomatic. Antiarrhythmic regimen is reasonably effective. QTC long (broad, paced QRS), but unchanged from previous  tracings. 2. CHF - EF 45-50%, some variation in evaluation, but direct image comparison shows little change. Preop EF was 40-45%, so current EF is expected. On ACE inh and "beta blocker" (sotalol). If CHF symptoms worsen, consider switching to Tikosyn and carvedilol. 3. CHB - pacemaker dependent. Her V pacing lead is an epicardial LV lead. Upgrade to CRT less likely to be of benefit 4. PM - normal device function 5. MR s/p annuloplasty  6.  Warfarin - Embolic risk is relatively high with history of valvular heart disease and abnormal left ventricular systolic function. CHADSVasc at least 4. At the time of annuloplasty repair had closure of left atrial appendage ; followup echo in 2012 shows no evidence of mitral insufficiency. The left atrium is mild moderately dilated.    Medication Adjustments/Labs and Tests Ordered: Current medicines are reviewed at length with the patient today.  Concerns regarding medicines are outlined above.  Medication changes, Labs and Tests ordered today are listed in the Patient Instructions below. Patient Instructions  Your physician recommends that you continue on your current medications as directed. Please refer to the Current Medication list given to you today.   Your physician wants you to follow-up in: 6 Months.  You will receive a reminder letter in the mail two months in advance. If you don't receive a letter, please call our office to schedule the follow-up appointment.     Mikael Spray, MD  05/25/2015 8:24 PM    [address], [place], [place]  [address] Phone: [phone]; Fax: [phone]

## 2015-05-23 NOTE — Patient Instructions (Signed)
   Your physician recommends that you continue on your current medications as directed. Please refer to the Current Medication list given to you today.    Your physician wants you to follow-up in: 6 Months. You will receive a reminder letter in the mail two months in advance. If you don't receive a letter, please call our office to schedule the follow-up appointment.  

## 2015-05-24 ENCOUNTER — Other Ambulatory Visit: Payer: Self-pay | Admitting: Cardiovascular Disease

## 2015-05-25 ENCOUNTER — Encounter: Payer: Self-pay | Admitting: Cardiovascular Disease

## 2015-05-25 DIAGNOSIS — I5042 Chronic combined systolic (congestive) and diastolic (congestive) heart failure: Secondary | ICD-10-CM | POA: Insufficient documentation

## 2015-05-25 NOTE — Telephone Encounter (Signed)
Rx has been sent to the pharmacy electronically. ° °

## 2015-07-05 ENCOUNTER — Ambulatory Visit (INDEPENDENT_AMBULATORY_CARE_PROVIDER_SITE_OTHER): Payer: Medicare Other | Admitting: Pharmacist Clinician (PhC)/ Clinical Pharmacy Specialist

## 2015-07-05 DIAGNOSIS — Z7901 Long term (current) use of anticoagulants: Secondary | ICD-10-CM

## 2015-07-05 DIAGNOSIS — I480 Paroxysmal atrial fibrillation: Secondary | ICD-10-CM

## 2015-07-05 DIAGNOSIS — I4891 Unspecified atrial fibrillation: Secondary | ICD-10-CM

## 2015-07-05 LAB — POCT INR: INR: 2.2

## 2015-08-10 ENCOUNTER — Telehealth: Payer: Self-pay | Admitting: Cardiovascular Disease

## 2015-08-10 NOTE — Telephone Encounter (Signed)
No answer, VM full. Reached Ms. Malkowski on home #. Scheduled for remote check 08/28/15. She is agreeable.

## 2015-08-10 NOTE — Telephone Encounter (Signed)
NeW Message  Pt calling to see if she is due for remote check- no recall or sched appt. Please call back and discuss.

## 2015-08-16 ENCOUNTER — Ambulatory Visit (INDEPENDENT_AMBULATORY_CARE_PROVIDER_SITE_OTHER): Payer: Medicare Other | Admitting: Pharmacist

## 2015-08-16 DIAGNOSIS — I4891 Unspecified atrial fibrillation: Secondary | ICD-10-CM | POA: Diagnosis not present

## 2015-08-16 DIAGNOSIS — I480 Paroxysmal atrial fibrillation: Secondary | ICD-10-CM | POA: Diagnosis not present

## 2015-08-16 DIAGNOSIS — Z7901 Long term (current) use of anticoagulants: Secondary | ICD-10-CM

## 2015-08-16 LAB — POCT INR: INR: 3

## 2015-08-19 ENCOUNTER — Other Ambulatory Visit: Payer: Self-pay | Admitting: Cardiovascular Disease

## 2015-08-28 ENCOUNTER — Telehealth: Payer: Self-pay | Admitting: Cardiology

## 2015-08-28 ENCOUNTER — Ambulatory Visit (INDEPENDENT_AMBULATORY_CARE_PROVIDER_SITE_OTHER): Payer: Medicare Other | Admitting: *Deleted

## 2015-08-28 DIAGNOSIS — Z8639 Personal history of other endocrine, nutritional and metabolic disease: Secondary | ICD-10-CM | POA: Insufficient documentation

## 2015-08-28 DIAGNOSIS — I442 Atrioventricular block, complete: Secondary | ICD-10-CM | POA: Diagnosis not present

## 2015-08-28 DIAGNOSIS — I10 Essential (primary) hypertension: Secondary | ICD-10-CM | POA: Diagnosis present

## 2015-08-28 NOTE — Telephone Encounter (Signed)
LMOVM reminding pt to send remote transmission.   

## 2015-08-29 ENCOUNTER — Encounter: Payer: Self-pay | Admitting: Cardiology

## 2015-08-29 NOTE — Progress Notes (Signed)
Remote pacemaker transmission.   

## 2015-09-01 LAB — CUP PACEART REMOTE DEVICE CHECK
Battery Impedance: 206 Ohm
Battery Remaining Longevity: 109 mo
Battery Voltage: 2.79 V
Brady Statistic AP VP Percent: 24 %
Brady Statistic AS VS Percent: 0 %
Implantable Lead Implant Date: 20060109
Implantable Lead Location: 753858
Implantable Lead Location: 753859
Implantable Lead Model: 5071
Implantable Lead Model: 5076
Lead Channel Impedance Value: 450 Ohm
Lead Channel Impedance Value: 501 Ohm
Lead Channel Sensing Intrinsic Amplitude: 2.8 mV
Lead Channel Setting Pacing Amplitude: 2.5 V
Lead Channel Setting Pacing Pulse Width: 0.4 ms
MDC IDC LEAD IMPLANT DT: 20060116
MDC IDC MSMT LEADCHNL RA PACING THRESHOLD AMPLITUDE: 0.75 V
MDC IDC MSMT LEADCHNL RA PACING THRESHOLD PULSEWIDTH: 0.4 ms
MDC IDC MSMT LEADCHNL RV PACING THRESHOLD AMPLITUDE: 1.125 V
MDC IDC MSMT LEADCHNL RV PACING THRESHOLD PULSEWIDTH: 0.4 ms
MDC IDC SESS DTM: 20170731171244
MDC IDC SET LEADCHNL RA PACING AMPLITUDE: 2 V
MDC IDC SET LEADCHNL RV SENSING SENSITIVITY: 4 mV
MDC IDC STAT BRADY AP VS PERCENT: 0 %
MDC IDC STAT BRADY AS VP PERCENT: 76 %

## 2015-09-11 ENCOUNTER — Telehealth: Payer: Self-pay

## 2015-09-11 DIAGNOSIS — I480 Paroxysmal atrial fibrillation: Secondary | ICD-10-CM

## 2015-09-11 DIAGNOSIS — Z79899 Other long term (current) drug therapy: Secondary | ICD-10-CM

## 2015-09-11 DIAGNOSIS — I472 Ventricular tachycardia, unspecified: Secondary | ICD-10-CM

## 2015-09-11 DIAGNOSIS — Z5181 Encounter for therapeutic drug level monitoring: Secondary | ICD-10-CM

## 2015-09-11 NOTE — Telephone Encounter (Signed)
-----   Message from Sanda Klein, MD sent at 09/07/2015  9:07 AM EDT ----- Please order echo for AFib and VT and CMET and Mag level for sotalol therapy

## 2015-09-11 NOTE — Telephone Encounter (Signed)
lmtcb Labs and echo ordered.

## 2015-09-12 ENCOUNTER — Encounter: Payer: Self-pay | Admitting: Cardiology

## 2015-09-12 NOTE — Progress Notes (Signed)
Letter  

## 2015-09-13 ENCOUNTER — Other Ambulatory Visit: Payer: Self-pay | Admitting: Cardiovascular Disease

## 2015-09-13 NOTE — Telephone Encounter (Signed)
[name] is this something that Dr. Loletha Grayer wanted done immediately or just something done prior to his follow in October?

## 2015-09-13 NOTE — Telephone Encounter (Signed)
Sooner rather than later.

## 2015-09-13 NOTE — Telephone Encounter (Signed)
Patient returned call and agreeable to plan.  Message forward to echo scheduler for scheduling.

## 2015-09-13 NOTE — Telephone Encounter (Signed)
Ok no problem.  

## 2015-09-18 ENCOUNTER — Telehealth: Payer: Self-pay | Admitting: Cardiovascular Disease

## 2015-09-18 NOTE — Telephone Encounter (Signed)
Reviewed - this is a return call to scheduling to set up recently ordered tests. Please assist this patient. Thanks.

## 2015-09-18 NOTE — Telephone Encounter (Signed)
New message ° ° ° ° °Pt returning nurse call. Please call.  °

## 2015-09-19 ENCOUNTER — Telehealth: Payer: Self-pay | Admitting: Cardiovascular Disease

## 2015-09-19 NOTE — Telephone Encounter (Signed)
Called and left a voice mail for the patient to call back and schedule her echocardiogram.

## 2015-09-27 ENCOUNTER — Ambulatory Visit (INDEPENDENT_AMBULATORY_CARE_PROVIDER_SITE_OTHER): Payer: Medicare Other | Admitting: Pharmacist Clinician (PhC)/ Clinical Pharmacy Specialist

## 2015-09-27 DIAGNOSIS — Z7901 Long term (current) use of anticoagulants: Secondary | ICD-10-CM | POA: Diagnosis not present

## 2015-09-27 DIAGNOSIS — I480 Paroxysmal atrial fibrillation: Secondary | ICD-10-CM | POA: Diagnosis not present

## 2015-09-27 DIAGNOSIS — I4891 Unspecified atrial fibrillation: Secondary | ICD-10-CM | POA: Diagnosis not present

## 2015-09-27 LAB — POCT INR: INR: 2.5

## 2015-10-05 ENCOUNTER — Other Ambulatory Visit: Payer: Self-pay

## 2015-10-05 ENCOUNTER — Ambulatory Visit (HOSPITAL_COMMUNITY): Payer: Medicare Other | Attending: Cardiology

## 2015-10-05 DIAGNOSIS — I472 Ventricular tachycardia, unspecified: Secondary | ICD-10-CM

## 2015-10-05 DIAGNOSIS — I517 Cardiomegaly: Secondary | ICD-10-CM | POA: Insufficient documentation

## 2015-10-05 DIAGNOSIS — I351 Nonrheumatic aortic (valve) insufficiency: Secondary | ICD-10-CM | POA: Insufficient documentation

## 2015-10-05 DIAGNOSIS — I509 Heart failure, unspecified: Secondary | ICD-10-CM | POA: Diagnosis not present

## 2015-10-05 DIAGNOSIS — I480 Paroxysmal atrial fibrillation: Secondary | ICD-10-CM | POA: Insufficient documentation

## 2015-10-05 DIAGNOSIS — I340 Nonrheumatic mitral (valve) insufficiency: Secondary | ICD-10-CM | POA: Diagnosis not present

## 2015-10-05 DIAGNOSIS — I4891 Unspecified atrial fibrillation: Secondary | ICD-10-CM | POA: Diagnosis present

## 2015-10-31 ENCOUNTER — Encounter: Payer: Self-pay | Admitting: Cardiovascular Disease

## 2015-10-31 ENCOUNTER — Ambulatory Visit (INDEPENDENT_AMBULATORY_CARE_PROVIDER_SITE_OTHER): Payer: Medicare Other | Admitting: Pharmacist Clinician (PhC)/ Clinical Pharmacy Specialist

## 2015-10-31 ENCOUNTER — Ambulatory Visit (INDEPENDENT_AMBULATORY_CARE_PROVIDER_SITE_OTHER): Payer: Medicare Other | Admitting: Cardiovascular Disease

## 2015-10-31 VITALS — BP 120/77 | HR 124 | Ht 63.5 in | Wt 213.8 lb

## 2015-10-31 DIAGNOSIS — I5042 Chronic combined systolic (congestive) and diastolic (congestive) heart failure: Secondary | ICD-10-CM | POA: Diagnosis not present

## 2015-10-31 DIAGNOSIS — I480 Paroxysmal atrial fibrillation: Secondary | ICD-10-CM | POA: Diagnosis not present

## 2015-10-31 DIAGNOSIS — Z7901 Long term (current) use of anticoagulants: Secondary | ICD-10-CM

## 2015-10-31 DIAGNOSIS — E785 Hyperlipidemia, unspecified: Secondary | ICD-10-CM

## 2015-10-31 DIAGNOSIS — I4891 Unspecified atrial fibrillation: Secondary | ICD-10-CM | POA: Diagnosis not present

## 2015-10-31 DIAGNOSIS — Z9889 Other specified postprocedural states: Secondary | ICD-10-CM

## 2015-10-31 DIAGNOSIS — Z79899 Other long term (current) drug therapy: Secondary | ICD-10-CM

## 2015-10-31 DIAGNOSIS — I442 Atrioventricular block, complete: Secondary | ICD-10-CM

## 2015-10-31 DIAGNOSIS — Z950 Presence of cardiac pacemaker: Secondary | ICD-10-CM

## 2015-10-31 LAB — CUP PACEART INCLINIC DEVICE CHECK
Date Time Interrogation Session: 20171003163347
Implantable Lead Location: 753858
Implantable Lead Model: 5076
Lead Channel Setting Pacing Amplitude: 2 V
Lead Channel Setting Pacing Amplitude: 2.5 V
Lead Channel Setting Pacing Pulse Width: 0.4 ms
Lead Channel Setting Sensing Sensitivity: 4 mV
MDC IDC LEAD IMPLANT DT: 20060109
MDC IDC LEAD IMPLANT DT: 20060116
MDC IDC LEAD LOCATION: 753859

## 2015-10-31 LAB — POCT INR: INR: 2.2

## 2015-10-31 NOTE — Progress Notes (Signed)
Patient ID: [name], female   DOB: 1945/03/15, 70 y.o.   MRN: [id number]    Cardiology Office Note    Date:  11/01/2015   ID:  [name], DOB 05/19/1945, MRN [id number]  PCP:  Pcp Not In System  Cardiologist:   Sanda Klein, MD   Chief Complaint  Patient presents with  . Follow-up    History of Present Illness:  [name] is a 70 y.o. female status post mitral valve annuloplasty, with sinus node dysfunction, high-grade AV block, paroxysmal atrial fibrillation on warfarin, here for clinical and pacemaker follow-up.  On arrival today the rhythm appeared to be atrial flutter with ventricular paced rhythm at 124 bpm. The rhythm spontaneously return to normal sinus and ventricular pacing during the exam.  Echo performed last month still shows mildly depressed left ventricular systolic function with EF of 45-50%, at least in part secondary to pacing induced systolic dyssynchrony. There was echo evidence of elevated filling pressures due to grade 2 diastolic dysfunction. The left atrium is mildly dilated with end systolic diameter of 41 mm.  Interrogation of her pacemaker today shows normal function. Estimated generator longevity is 8 years. She has24% atrial pacing with a good heart rate distribution. She has complete heart block with a very slow ventricular escape rhythm at 30 beats per minute. She has 99.6% ventricular pacing. The burden of atrial fibrillation is 2.7%. There has been a recent increase in burden of arrhythmia towards the end of last month, but most episodes are quite brief. Ventricular rate control was good with rates in the 80-90s. She has not had meaningful ventricular arrhythmia.  She describes mild exertional dyspnea (one flight of stairs) and has occasional wheezing when shelies down, but does not describe true orthopnea. she has not had edema, palpitations, syncope, focal neurological complaints, bleeding, claudication.  She reports recent issues with  compliance with sotalol. She is especially prone to missing the second dose of medication in the evening.  [name] had mitral valve annuloplasty for severe mitral insufficiency in 2006. She did not have coronary disease by preoperative angiography and has normal left ventricular systolic function. Followup echocardiography 09/30/2013 shows borderline low left ventricular ejection fraction (45-50%). Valve repair looks good. She had a dual chamber permanent pacemaker (Medtronic) implanted in 2006 and has complete heart block . The right atrial lead is a conventional transvenous lead. The ventricular lead is an active fixation epicardial lead on the surface of the left ventricle placed at the time of her surgery. A generator change was performed in 2014. Her pacemaker is located in a deep subpectoral pocket. She has paroxysmal atrial fibrillation. She has treated hypertension, dyslipidemia and type 2 diabetes mellitus on the background of severe obesity.   Past Medical History:  Diagnosis Date  . Arthritis   . Atrial fibrillation ([place])    PAF---currently anticoagulated with Warfarin  . Atrial flutter ([place]) 02/21/2004   s/p mitral valve repair and ppm insertion  . Congenital heart block 08/26/2003   Diagnosed by Dr. Clovis Fredrickson. A temp orary pacemaker was placed in 1988 during hysterectomy procedure.  . Diabetes mellitus without complication (H[facility])    prediabetic  . Diverticulosis   . Dyslipidemia    takes Niacin and Crestor daily  . History of blood transfusion    no abnormal reaction noted  . Hypothyroidism    takes Synthroid daily  . Mitral valve insufficiency    Recieved Mitral valve repair with reconstruction of the anterior leaflet cordis  with cortex and ring annuloplasty  . Nonischemic cardiomyopathy ([place]) 2006   EF of 35% due to AV dyssyncrony. Has since resolved per most recent ECHO.  Marland Kitchen RBBB    Due the VP with the LV epicardial lead that was inserted in 2006 during the mitral  repair  . Systemic hypertension    takes  Ramipril daily-Maxzide every other day  . Urinary urgency     Past Surgical History:  Procedure Laterality Date  . ABDOMINAL HYSTERECTOMY    . BREAST LUMPECTOMY WITH RADIOACTIVE SEED LOCALIZATION Right 11/22/2014   Procedure: RIGHT BREAST LUMPECTOMY WITH RADIOACTIVE SEED LOCALIZATION;  Surgeon: Fanny Skates, MD;  Location: [facility];  Service: General;  Laterality: Right;  . CARDIAC CATHETERIZATION  10/20/2003   Minimal decrease in the LV systolic EF of [id number] with global hyperkinesis. Moderately dilated LV. 3+ Mitral regurgitation. Underfilling of LAD which promptly improved with intracoronary NTG administration. Microvascular angina or microvascular spasm. Otherwise coronaries were normal. No intervention necessary.  Marland Kitchen CARDIOVASCULAR STRESS TEST  09/14/2003   Performed for the pt's congenital CHB that was discovered during a preoperative evaluation for a colonoscopy.  EKG demonstrated CHB with junctional escape. Nonspecific ST changes noted. Stress ECG was positive for ischemia(2 mm ST depression). Dilated LV cavity. Inability to calculate systolic function. High risk scan. This scan was followed up with a cardiac cath.  . COLONOSCOPY    . MITRAL VALVE ANNULOPLASTY  02/06/2004   Performed for severe MR requiring mitral valve repair with anterior leaflet reconstruction and a McBride annuloplasty ring--model: [id number] [id number], and an 80% lesion reduction (Dr. Servando Snare). Two LV leads were placed epicardially just in the event that biv pacing would be needed. (One of the leads had been used for during the ppm insertion recieved one week later)   . PERMANENT PACEMAKER GENERATOR CHANGE  07/06/2012   Medtronic  . PERMANENT PACEMAKER GENERATOR CHANGE N/A 07/06/2012   Procedure: PERMANENT PACEMAKER GENERATOR CHANGE;  Surgeon: Sanda Klein, MD;  Location: [facility];  Service: Cardiovascular;  Laterality: N/A;  . PERMANENT PACEMAKER INSERTION   02/13/2004   PPM implant following a mitral valve annuloplasty. An epicardial active fixation lead (implanted on 02/06/2004 during the mitral valve procedure) was used for this implant.   . US ECHOCARDIOGRAPHY  09/10/2010 and1/07/2008   The last echo showed an EF of >55%; RV mildly dilated. Mild to moderately dilated LA  with borderline RA dilation as well. Impaired diastolic function. Normal RV systolic function.MVR without residual insufficiency.No Pericardial effusion.    Current Medications: Outpatient Medications Prior to Visit  Medication Sig Dispense Refill  . acetaminophen (TYLENOL) 325 MG tablet Take 650 mg by mouth as needed for pain.    . cholecalciferol (VITAMIN D) 400 UNITS TABS Take 400 Units by mouth daily.    . folic acid (FOLVITE) 1 MG tablet Take 1 tablet (1 mg total) by mouth daily. 90 tablet 3  . HYDROcodone-acetaminophen (NORCO) 5-325 MG tablet Take 1-2 tablets by mouth every 6 (six) hours as needed for moderate pain or severe pain. 30 tablet 0  . levothyroxine (SYNTHROID, LEVOTHROID) 75 MCG tablet Take 75 mcg by mouth daily before breakfast.    . niacin 500 MG tablet Take 500 mg by mouth every evening.     . Omega-3 Fatty Acids (OMEGA 3 PO) Take 1,000 mg by mouth daily.    . ramipril (ALTACE) 10 MG capsule TAKE 1 CAPSULE BY MOUTH AT BEDTIME 30 capsule 11  . rosuvastatin (CRESTOR) 10  MG tablet Take 1 tablet (10 mg total) by mouth daily. 28 tablet 0  . sotalol (BETAPACE) 80 MG tablet TAKE 1 TABLET BY MOUTH TWICE DAILY 60 tablet 6  . triamterene-hydrochlorothiazide (MAXZIDE-25) 37.5-25 MG tablet Take 0.5 tablets by mouth daily. 15 tablet 6  . warfarin (COUMADIN) 5 MG tablet TAKE 1 AND 1/2 TABLETS BY MOUTH DAILY AS DIRECTED 135 tablet 0  . aspirin 81 MG tablet Take 81 mg by mouth daily.     No facility-administered medications prior to visit.      Allergies:   Review of patient's allergies indicates no known allergies.   Social History   Social History  . Marital status:  Single    Spouse name: N/A  . Number of children: N/A  . Years of education: N/A   Social History Main Topics  . Smoking status: Former Smoker    Types: Cigarettes  . Smokeless tobacco: Not on file     Comment: quit smoking in 2006  . Alcohol use Yes     Comment: wine occasionally  . Drug use: No  . Sexual activity: Yes    Birth control/ protection: Surgical   Other Topics Concern  . Not on file   Social History Narrative  . No narrative on file     Family History:  The patient's family history includes Asthma in her brother and sister; Cancer - Other in her mother; Healthy in her brother, brother, brother, and brother; Pneumonia in her father.   ROS:   Please see the history of present illness.    ROS All other systems reviewed and are negative.   PHYSICAL EXAM:   VS:  BP 120/77 (BP Location: Left Arm, Patient Position: Sitting, Cuff Size: Normal)   Pulse (!) 124   Ht 5' 3.5" (1.613 m)   Wt 97 kg (213 lb 12.8 oz)   SpO2 95%   BMI 37.28 kg/m    GEN: Well nourished, well developed, in no acute distress  HEENT: normal  Neck: no JVD, carotid bruits, or masses Cardiac: paradoxically split S2, RRR; no murmurs, rubs, or gallops,no edema  Respiratory:  clear to auscultation bilaterally, normal work of breathing GI: soft, nontender, nondistended, + BS MS: no deformity or atrophy  Skin: warm and dry, no rash Neuro:  Alert and Oriented x 3, Strength and sensation are intact Psych: euthymic mood, full affect  Wt Readings from Last 3 Encounters:  10/31/15 97 kg (213 lb 12.8 oz)  05/23/15 96.8 kg (213 lb 6.4 oz)  11/22/14 95.3 kg (210 lb)      Studies/Labs Reviewed:   EKG:  EKG is ordered today.  The ekg ordered today demonstrates A pace V paced rhythm. QTC 583 ms (was 553 ms last year, 565 ms earlier this year)  Recent Labs: 11/16/2014: ALT 19; Hemoglobin 13.0; Platelets 285 03/01/2015: BUN 14; Creat 0.89; Magnesium 2.0; Potassium 4.4; Sodium 142     ASSESSMENT:      1. PAF (paroxysmal atrial fibrillation) ([place])   2. Chronic combined systolic and diastolic heart failure ([place])   3. Complete heart block ([place])   4. Pacemaker   5. H/O mitral valve repair   6. Long term current use of anticoagulant therapy   7. Dyslipidemia   8. Medication management      PLAN:  In order of problems listed above:  1. Afib - Low (<3%) atrial fibrillation burden. Not apparently symptomatic. Antiarrhythmic regimen is reasonably effective. QTC long (paced QRS), slightly prolonged from previous  tracings. Need to recheck labs. 2. CHF - EF 45-50%, some variation in evaluation, but direct image comparison shows little change. Preop EF was 40-45%, so current EF is expected. On ACE inhibitor and "beta blocker" (sotalol). Discussed the potential for arrhythmia "rebound" if she misses sotalol doses and encouraged careful compliance with the medication on a Q12 hours schedule. 3. CHB - pacemaker dependent. Her V pacing lead is an epicardial LV lead. Upgrade to CRT less likely to be of benefit. 4. PM - normal device function 5. MR s/p annuloplasty: Good valve repair results are durable 6. Warfarin - Embolic risk is relatively high with history of valvular heart disease and abnormal left ventricular systolic function. CHADSVasc at least 4. At the time of annuloplasty repair had closure of left atrial appendage ; followup echo in 2017 shows no evidence of mitral insufficiency. The left atrium is mildly dilated.  7. HLP: Repeat labs   Medication Adjustments/Labs and Tests Ordered: Current medicines are reviewed at length with the patient today.  Concerns regarding medicines are outlined above.  Medication changes, Labs and Tests ordered today are listed in the Patient Instructions below. Patient Instructions  Dr Sallyanne Kuster has recommended making the following medication changes: 1. STOP Aspirin  Your physician recommends that you return for lab work at your [facility].  Remote monitoring is used to monitor your Pacemaker of ICD from home. This monitoring reduces the number of office visits required to check your device to one time per year. It allows [place] to keep an eye on the functioning of your device to ensure it is working properly. You are scheduled for a device check from home on Tuesday, January 2nd, 2018. You may send your transmission at any time that day. If you have a wireless device, the transmission will be sent automatically. After your physician reviews your transmission, you will receive a postcard with your next transmission date.  Dr Sallyanne Kuster recommends that you schedule a follow-up appointment in 6 months with a device check. You will receive a reminder letter in the mail two months in advance. If you don't receive a letter, please call our office to schedule the follow-up appointment.  If you need a refill on your cardiac medications before your next appointment, please call your pharmacy    Signed, Sanda Klein, MD  11/01/2015 8:17 AM    [address], [place], [place]  [address] Phone: [phone]; Fax: [phone]

## 2015-10-31 NOTE — Patient Instructions (Signed)
Dr Sallyanne Kuster has recommended making the following medication changes: 1. STOP Aspirin  Your physician recommends that you return for lab work at your [facility].  Remote monitoring is used to monitor your Pacemaker of ICD from home. This monitoring reduces the number of office visits required to check your device to one time per year. It allows [place] to keep an eye on the functioning of your device to ensure it is working properly. You are scheduled for a device check from home on Tuesday, January 2nd, 2018. You may send your transmission at any time that day. If you have a wireless device, the transmission will be sent automatically. After your physician reviews your transmission, you will receive a postcard with your next transmission date.  Dr Sallyanne Kuster recommends that you schedule a follow-up appointment in 6 months with a device check. You will receive a reminder letter in the mail two months in advance. If you don't receive a letter, please call our office to schedule the follow-up appointment.  If you need a refill on your cardiac medications before your next appointment, please call your pharmacy

## 2015-11-01 ENCOUNTER — Encounter: Payer: Self-pay | Admitting: Cardiovascular Disease

## 2015-11-09 LAB — COMPREHENSIVE METABOLIC PANEL
ALBUMIN: 3.9 g/dL (ref 3.6–5.1)
ALT: 14 U/L (ref 6–29)
AST: 18 U/L (ref 10–35)
Alkaline Phosphatase: 57 U/L (ref 33–130)
BUN: 15 mg/dL (ref 7–25)
CHLORIDE: 107 mmol/L (ref 98–110)
CO2: 27 mmol/L (ref 20–31)
Calcium: 9 mg/dL (ref 8.6–10.4)
Creat: 0.9 mg/dL (ref 0.50–0.99)
Glucose, Bld: 84 mg/dL (ref 65–99)
POTASSIUM: 4.1 mmol/L (ref 3.5–5.3)
Sodium: 142 mmol/L (ref 135–146)
TOTAL PROTEIN: 6.3 g/dL (ref 6.1–8.1)
Total Bilirubin: 0.6 mg/dL (ref 0.2–1.2)

## 2015-11-09 LAB — LIPID PANEL
CHOL/HDL RATIO: 3 ratio (ref <=5.0)
Cholesterol: 157 mg/dL (ref 125–200)
HDL: 53 mg/dL (ref >=46)
LDL Cholesterol: 86 mg/dL (ref <130)
TRIGLYCERIDES: 92 mg/dL (ref <150)
VLDL: 18 mg/dL (ref <30)

## 2015-12-13 ENCOUNTER — Ambulatory Visit (INDEPENDENT_AMBULATORY_CARE_PROVIDER_SITE_OTHER): Payer: Medicare Other | Admitting: Pharmacist

## 2015-12-13 DIAGNOSIS — Z7901 Long term (current) use of anticoagulants: Secondary | ICD-10-CM

## 2015-12-13 DIAGNOSIS — I480 Paroxysmal atrial fibrillation: Secondary | ICD-10-CM | POA: Diagnosis not present

## 2015-12-13 DIAGNOSIS — I4891 Unspecified atrial fibrillation: Secondary | ICD-10-CM | POA: Diagnosis not present

## 2015-12-13 LAB — POCT INR: INR: 2.3

## 2015-12-15 ENCOUNTER — Other Ambulatory Visit: Payer: Self-pay | Admitting: Cardiovascular Disease

## 2015-12-22 ENCOUNTER — Other Ambulatory Visit: Payer: Self-pay | Admitting: Cardiovascular Disease

## 2015-12-25 NOTE — Telephone Encounter (Signed)
Rx has been sent to the pharmacy electronically. ° °

## 2016-01-24 ENCOUNTER — Ambulatory Visit (INDEPENDENT_AMBULATORY_CARE_PROVIDER_SITE_OTHER): Payer: Medicare Other | Admitting: Pharmacist

## 2016-01-24 DIAGNOSIS — I480 Paroxysmal atrial fibrillation: Secondary | ICD-10-CM | POA: Diagnosis not present

## 2016-01-24 DIAGNOSIS — Z7901 Long term (current) use of anticoagulants: Secondary | ICD-10-CM | POA: Diagnosis not present

## 2016-01-24 DIAGNOSIS — I4891 Unspecified atrial fibrillation: Secondary | ICD-10-CM

## 2016-01-24 LAB — POCT INR: INR: 1.7

## 2016-01-30 ENCOUNTER — Ambulatory Visit (INDEPENDENT_AMBULATORY_CARE_PROVIDER_SITE_OTHER): Payer: Medicare Other | Admitting: *Deleted

## 2016-01-30 DIAGNOSIS — I442 Atrioventricular block, complete: Secondary | ICD-10-CM | POA: Diagnosis not present

## 2016-02-02 ENCOUNTER — Encounter: Payer: Self-pay | Admitting: Cardiology

## 2016-02-02 LAB — CUP PACEART REMOTE DEVICE CHECK
Battery Remaining Longevity: 99 mo
Battery Voltage: 2.8 V
Brady Statistic AP VP Percent: 21 %
Brady Statistic AS VP Percent: 79 %
Implantable Lead Implant Date: 20060116
Implantable Lead Model: 5071
Implantable Lead Model: 5076
Implantable Pulse Generator Implant Date: 20140609
Lead Channel Impedance Value: 438 Ohm
Lead Channel Pacing Threshold Amplitude: 0.75 V
Lead Channel Pacing Threshold Amplitude: 1.25 V
Lead Channel Pacing Threshold Pulse Width: 0.4 ms
Lead Channel Setting Pacing Amplitude: 2 V
Lead Channel Setting Sensing Sensitivity: 4 mV
MDC IDC LEAD IMPLANT DT: 20060109
MDC IDC LEAD LOCATION: 753858
MDC IDC LEAD LOCATION: 753859
MDC IDC MSMT BATTERY IMPEDANCE: 278 Ohm
MDC IDC MSMT LEADCHNL RA PACING THRESHOLD PULSEWIDTH: 0.4 ms
MDC IDC MSMT LEADCHNL RV IMPEDANCE VALUE: 489 Ohm
MDC IDC SESS DTM: 20180102151700
MDC IDC SET LEADCHNL RV PACING AMPLITUDE: 2.5 V
MDC IDC SET LEADCHNL RV PACING PULSEWIDTH: 0.4 ms
MDC IDC STAT BRADY AP VS PERCENT: 0 %
MDC IDC STAT BRADY AS VS PERCENT: 0 %

## 2016-02-21 ENCOUNTER — Ambulatory Visit (INDEPENDENT_AMBULATORY_CARE_PROVIDER_SITE_OTHER): Payer: Medicare Other | Admitting: Pharmacist

## 2016-02-21 DIAGNOSIS — I4891 Unspecified atrial fibrillation: Secondary | ICD-10-CM

## 2016-02-21 DIAGNOSIS — I480 Paroxysmal atrial fibrillation: Secondary | ICD-10-CM | POA: Diagnosis not present

## 2016-02-21 DIAGNOSIS — Z7901 Long term (current) use of anticoagulants: Secondary | ICD-10-CM

## 2016-02-21 LAB — POCT INR: INR: 2.3

## 2016-04-03 ENCOUNTER — Ambulatory Visit (INDEPENDENT_AMBULATORY_CARE_PROVIDER_SITE_OTHER): Payer: Medicare Other | Admitting: Pharmacist

## 2016-04-03 DIAGNOSIS — Z7901 Long term (current) use of anticoagulants: Secondary | ICD-10-CM

## 2016-04-03 DIAGNOSIS — I4891 Unspecified atrial fibrillation: Secondary | ICD-10-CM | POA: Diagnosis not present

## 2016-04-03 DIAGNOSIS — I480 Paroxysmal atrial fibrillation: Secondary | ICD-10-CM | POA: Diagnosis not present

## 2016-04-03 LAB — POCT INR: INR: 2.5

## 2016-04-13 ENCOUNTER — Other Ambulatory Visit: Payer: Self-pay | Admitting: Cardiovascular Disease

## 2016-04-28 ENCOUNTER — Other Ambulatory Visit: Payer: Self-pay | Admitting: Cardiovascular Disease

## 2016-05-02 ENCOUNTER — Other Ambulatory Visit: Payer: Self-pay | Admitting: Cardiovascular Disease

## 2016-05-07 DIAGNOSIS — E7800 Pure hypercholesterolemia, unspecified: Secondary | ICD-10-CM | POA: Insufficient documentation

## 2016-05-07 NOTE — Progress Notes (Signed)
Patient ID: [name], female   DOB: [birth year], 71 y.o.   MRN: [id number]    Cardiology Office Note    Date:  05/08/2016   ID:  [name], DOB [birth year], MRN [id number]  PCP:  Pcp Not In System  Cardiologist:   Sanda Klein, MD   Chief Complaint  Patient presents with  . Follow-up    History of Present Illness:  [name] is a 71 y.o. female status post mitral valve annuloplasty, with sinus node dysfunction, high-grade AV block, paroxysmal atrial fibrillation on warfarin, here for clinical and pacemaker follow-up.  She has done generally well since her last appointment, has chronic functional class II exertional dyspnea and denies angina, bleeding, focal neurological complaints, leg edema. She is not aware of palpitations. She had one episodes of dizziness and sensation of almost passing out while sitting at a red light in her car, but the symptoms spontaneously resolved within a matter of a few seconds. She does not remember the exact date of this occurrence but believes it was a couple of months ago.  Pacemaker interrogation shows normal device function, estimated generator longevity of 7.5 years, normal lead parameters, 22% atrial pacing and 100% ventricular pacing. She has had frequent but generally brief episodes of atrial mode switch with an overall burden of 1.6%. Clear atrial fibrillation lasting for about 3 hours occurred on February 12. The longest episode of mode switch lasted for about 9 hours last December. Ventricular rate control is satisfactory during atrial fibrillation, ventricular rates on the average were 120 in December when the rhythm more closely resembled atrial flutter or atrial tachycardia. At her last visit the rhythm appeared to be atrial flutter with ventricular paced rhythm at 124 bpm. The rhythm spontaneously returned to normal sinus and ventricular pacing during the exam.  The device has recorded 6 episodes of nonsustained VT since last  October. None of these seem to coincide with her episode of near syncope a couple of months ago. The fastest was at 187 bpm.  There is no underlying escape rhythm when pacing this taken down to 30 bpm.  Echo September 2017 shows mildly depressed left ventricular systolic function with EF of 45-50%, at least in part secondary to pacing induced systolic dyssynchrony. There was echo evidence of elevated filling pressures due to grade 2 diastolic dysfunction. The left atrium is mildly dilated with end systolic diameter of 41 mm. there was no evidence of mitral insufficiency and her annuloplasty was not associated with significantly increased mitral gradients.  [name] had mitral valve annuloplasty for severe mitral insufficiency in 2006. She did not have coronary disease by preoperative angiography and has normal left ventricular systolic function. Followup echocardiography 2015 and 2017 shows borderline low left ventricular ejection fraction (45-50%). Valve repair looks good. She had a dual chamber permanent pacemaker (Medtronic) implanted in 2006 and has complete heart block . The right atrial lead is a conventional transvenous lead. The ventricular lead is an active fixation epicardial lead on the surface of the left ventricle placed at the time of her surgery. A generator change was performed in 2014. Her pacemaker is located in a deep subpectoral pocket. She has paroxysmal atrial fibrillation. She has treated hypertension, dyslipidemia and type 2 diabetes mellitus on the background of severe obesity.   Past Medical History:  Diagnosis Date  . Arthritis   . Atrial fibrillation ([place])    PAF---currently anticoagulated with Warfarin  . Atrial flutter ([place]) 02/21/2004   s/p mitral  valve repair and ppm insertion  . Congenital heart block 08/26/2003   Diagnosed by Dr. Clovis Fredrickson. A temp orary pacemaker was placed in 1988 during hysterectomy procedure.  . Diabetes mellitus without complication  (HCC)    prediabetic  . Diverticulosis   . Dyslipidemia    takes Niacin and Crestor daily  . History of blood transfusion    no abnormal reaction noted  . Hypothyroidism    takes Synthroid daily  . Mitral valve insufficiency    Recieved Mitral valve repair with reconstruction of the anterior leaflet cordis with cortex and ring annuloplasty  . Nonischemic cardiomyopathy ([place]) 2006   EF of 35% due to AV dyssyncrony. Has since resolved per most recent ECHO.  [name] RBBB    Due the VP with the LV epicardial lead that was inserted in 2006 during the mitral repair  . Systemic hypertension    takes  Ramipril daily-Maxzide every other day  . Urinary urgency     Past Surgical History:  Procedure Laterality Date  . ABDOMINAL HYSTERECTOMY    . BREAST LUMPECTOMY WITH RADIOACTIVE SEED LOCALIZATION Right 11/22/2014   Procedure: RIGHT BREAST LUMPECTOMY WITH RADIOACTIVE SEED LOCALIZATION;  Surgeon: Fanny Skates, MD;  Location: [facility];  Service: General;  Laterality: Right;  . CARDIAC CATHETERIZATION  10/20/2003   Minimal decrease in the LV systolic EF of 62-69% with global hyperkinesis. Moderately dilated LV. 3+ Mitral regurgitation. Underfilling of LAD which promptly improved with intracoronary NTG administration. Microvascular angina or microvascular spasm. Otherwise coronaries were normal. No intervention necessary.  [name] CARDIOVASCULAR STRESS TEST  09/14/2003   Performed for the pt's congenital CHB that was discovered during a preoperative evaluation for a colonoscopy.  EKG demonstrated CHB with junctional escape. Nonspecific ST changes noted. Stress ECG was positive for ischemia(2 mm ST depression). Dilated LV cavity. Inability to calculate systolic function. High risk scan. This scan was followed up with a cardiac cath.  . COLONOSCOPY    . MITRAL VALVE ANNULOPLASTY  02/06/2004   Performed for severe MR requiring mitral valve repair with anterior leaflet reconstruction and a [place]  annuloplasty ring--model: [id number], and an 80% lesion reduction (Dr. Servando Snare). Two LV leads were placed epicardially just in the event that biv pacing would be needed. (One of the leads had been used for during the ppm insertion recieved one week later)   . PERMANENT PACEMAKER GENERATOR CHANGE  07/06/2012   Medtronic  . PERMANENT PACEMAKER GENERATOR CHANGE N/A 07/06/2012   Procedure: PERMANENT PACEMAKER GENERATOR CHANGE;  Surgeon: Sanda Klein, MD;  Location: [facility];  Service: Cardiovascular;  Laterality: N/A;  . PERMANENT PACEMAKER INSERTION  02/13/2004   PPM implant following a mitral valve annuloplasty. An epicardial active fixation lead (implanted on 02/06/2004 during the mitral valve procedure) was used for this implant.   . US ECHOCARDIOGRAPHY  09/10/2010 and1/07/2008   The last echo showed an EF of >55%; RV mildly dilated. Mild to moderately dilated LA  with borderline RA dilation as well. Impaired diastolic function. Normal RV systolic function.MVR without residual insufficiency.No Pericardial effusion.    Current Medications: Outpatient Medications Prior to Visit  Medication Sig Dispense Refill  . acetaminophen (TYLENOL) 325 MG tablet Take 650 mg by mouth as needed for pain.    . cholecalciferol (VITAMIN D) 400 UNITS TABS Take 400 Units by mouth daily.    . folic acid (FOLVITE) 1 MG tablet TAKE 1 TABLET(1 MG) BY MOUTH DAILY 90 tablet 2  . HYDROcodone-acetaminophen (NORCO) 5-325  MG tablet Take 1-2 tablets by mouth every 6 (six) hours as needed for moderate pain or severe pain. 30 tablet 0  . levothyroxine (SYNTHROID, LEVOTHROID) 75 MCG tablet Take 75 mcg by mouth daily before breakfast.    . niacin 500 MG tablet Take 500 mg by mouth every evening.     . Omega-3 Fatty Acids (OMEGA 3 PO) Take 1,000 mg by mouth daily.    . ramipril (ALTACE) 10 MG capsule TAKE 1 CAPSULE BY MOUTH AT BEDTIME 30 capsule 11  . rosuvastatin (CRESTOR) 10 MG tablet Take 1 tablet (10 mg total) by mouth daily.  28 tablet 0  . sotalol (BETAPACE) 80 MG tablet TAKE 1 TABLET BY MOUTH TWICE DAILY 60 tablet 0  . warfarin (COUMADIN) 5 MG tablet Take 1 to 1 and 1/2 tablets every day as directed by coumadin clinic 135 tablet 0  . triamterene-hydrochlorothiazide (MAXZIDE-25) 37.5-25 MG tablet Take 0.5 tablets by mouth daily. 15 tablet 6  . triamterene-hydrochlorothiazide (MAXZIDE-25) 37.5-25 MG tablet TAKE 1/2 TABLET BY MOUTH EVERY DAY AS DIRECTED 15 tablet 0   No facility-administered medications prior to visit.      Allergies:   Patient has no known allergies.   Social History   Social History  . Marital status: Single    Spouse name: N/A  . Number of children: N/A  . Years of education: N/A   Social History Main Topics  . Smoking status: Former Smoker    Types: Cigarettes  . Smokeless tobacco: Never Used     Comment: quit smoking in 2006  . Alcohol use Yes     Comment: wine occasionally  . Drug use: No  . Sexual activity: Yes    Birth control/ protection: Surgical   Other Topics Concern  . Not on file   Social History Narrative  . No narrative on file     Family History:  The patient's family history includes Asthma in her brother and sister; Cancer - Other in her mother; Healthy in her brother, brother, brother, and brother; Pneumonia in her father.   ROS:   Please see the history of present illness.    ROS All other systems reviewed and are negative.   PHYSICAL EXAM:   VS:  BP 128/82   Pulse 68   Ht 5' 3.5" (1.613 m)   Wt 96.2 kg (212 lb)   BMI 36.97 kg/m    GEN: Well nourished, well developed, in no acute distress  HEENT: normal  Neck: no JVD, carotid bruits, or masses Cardiac: paradoxically split S2, RRR; no murmurs, rubs, or gallops,no edema . The subclavian pacemaker site looks healthy. Respiratory:  clear to auscultation bilaterally, normal work of breathing GI: soft, nontender, nondistended, + BS MS: no deformity or atrophy  Skin: warm and dry, no rash Neuro:   Alert and Oriented x 3, Strength and sensation are intact Psych: euthymic mood, full affect  Wt Readings from Last 3 Encounters:  05/08/16 96.2 kg (212 lb)  10/31/15 97 kg (213 lb 12.8 oz)  05/23/15 96.8 kg (213 lb 6.4 oz)      Studies/Labs Reviewed:   EKG:  EKG is  ordered today.  The ekg ordered today demonstrates A pace V paced rhythm. Prominent positive R waves in V1-V2 consistent with epicardial pacing QTC 557 ms (was 583 ms last appt, 565 ms earlier last this year)  Recent Labs: 11/08/2015: ALT 14; BUN 15; Creat 0.90; Potassium 4.1; Sodium 142     ASSESSMENT:  1. PAF (paroxysmal atrial fibrillation) ([place])   2. Encounter for monitoring anti-arrhythmic therapy   3. NSVT (nonsustained ventricular tachycardia) ([place])   4. Chronic combined systolic and diastolic heart failure ([place])   5. Complete heart block ([place])   6. Pacemaker   7. H/O mitral valve repair   8. Long term current use of anticoagulant therapy   9. Hypercholesterolemia   10. Medication management      PLAN:  In order of problems listed above:  1. Afib - Low (1.6%) atrial fibrillation burden. Not apparently symptomatic. Antiarrhythmic regimen is reasonably effective. QTC long (paced QRS), slightly prolonged from previous tracings.  2. Sotalol - Need to recheck labs. 3. NSVT - episodes are brief and not particularly fast. None of the recorded events appears to correlate with her episode of near syncope. Continue to monitor via device. Check labs today. 4. CHF - EF 45-50%, some variation in evaluation, but direct image comparison shows little change. Preop EF was 40-45%, so current EF is expected. On ACE inhibitor and "beta blocker" (sotalol). Discussed the potential for arrhythmia "rebound" if she misses sotalol doses and encouraged careful compliance with the medication on a Q12 hours schedule. 5. CHB - pacemaker dependent. Her V pacing lead is an epicardial LV lead. Upgrade to CRT less likely to be of  benefit. 6. PM - normal device function 7. MR s/p annuloplasty - Good valve repair results are durable 8. Warfarin - Embolic risk is relatively high with history of valvular heart disease and abnormal left ventricular systolic function. CHADSVasc at least 4. At the time of annuloplasty repair had closure of left atrial appendage ; followup echo in 2017 shows no evidence of mitral insufficiency. The left atrium is mildly dilated.  9. HLP - Repeat labs   Medication Adjustments/Labs and Tests Ordered: Current medicines are reviewed at length with the patient today.  Concerns regarding medicines are outlined above.  Medication changes, Labs and Tests ordered today are listed in the Patient Instructions below. Patient Instructions  Dr Sallyanne Kuster recommends that you continue on your current medications as directed. Please refer to the Current Medication list given to you today.  Your physician recommends that you return for lab work at your earliest [place].  Remote monitoring is used to monitor your Pacemaker of ICD from home. This monitoring reduces the number of office visits required to check your device to one time per year. It allows [place] to keep an eye on the functioning of your device to ensure it is working properly. You are scheduled for a device check from home on Wednesday, July 11th, 2018. You may send your transmission at any time that day. If you have a wireless device, the transmission will be sent automatically. After your physician reviews your transmission, you will receive a postcard with your next transmission date.  Dr Sallyanne Kuster recommends that you schedule a follow-up appointment in 6 months with a pacemaker check. You will receive a reminder letter in the mail two months in advance. If you don't receive a letter, please call our office to schedule the follow-up appointment.  If you need a refill on your cardiac medications before your next appointment, please call your  pharmacy.    Signed, Sanda Klein, MD  05/08/2016 9:21 AM    [facility] [address], [place], [place]  [address] Phone: [phone]; Fax: [phone]

## 2016-05-08 ENCOUNTER — Encounter: Payer: Self-pay | Admitting: Cardiovascular Disease

## 2016-05-08 ENCOUNTER — Ambulatory Visit (INDEPENDENT_AMBULATORY_CARE_PROVIDER_SITE_OTHER): Payer: Medicare Other | Admitting: Cardiovascular Disease

## 2016-05-08 ENCOUNTER — Ambulatory Visit (INDEPENDENT_AMBULATORY_CARE_PROVIDER_SITE_OTHER): Payer: Medicare Other | Admitting: Pharmacist Clinician (PhC)/ Clinical Pharmacy Specialist

## 2016-05-08 VITALS — BP 128/82 | HR 68 | Ht 63.5 in | Wt 212.0 lb

## 2016-05-08 DIAGNOSIS — I442 Atrioventricular block, complete: Secondary | ICD-10-CM | POA: Diagnosis not present

## 2016-05-08 DIAGNOSIS — Z7901 Long term (current) use of anticoagulants: Secondary | ICD-10-CM

## 2016-05-08 DIAGNOSIS — I4891 Unspecified atrial fibrillation: Secondary | ICD-10-CM

## 2016-05-08 DIAGNOSIS — E7800 Pure hypercholesterolemia, unspecified: Secondary | ICD-10-CM | POA: Diagnosis not present

## 2016-05-08 DIAGNOSIS — Z950 Presence of cardiac pacemaker: Secondary | ICD-10-CM

## 2016-05-08 DIAGNOSIS — I472 Ventricular tachycardia: Secondary | ICD-10-CM

## 2016-05-08 DIAGNOSIS — I5042 Chronic combined systolic (congestive) and diastolic (congestive) heart failure: Secondary | ICD-10-CM | POA: Diagnosis not present

## 2016-05-08 DIAGNOSIS — I480 Paroxysmal atrial fibrillation: Secondary | ICD-10-CM

## 2016-05-08 DIAGNOSIS — Z5181 Encounter for therapeutic drug level monitoring: Secondary | ICD-10-CM | POA: Diagnosis not present

## 2016-05-08 DIAGNOSIS — I4729 Other ventricular tachycardia: Secondary | ICD-10-CM

## 2016-05-08 DIAGNOSIS — Z9889 Other specified postprocedural states: Secondary | ICD-10-CM

## 2016-05-08 DIAGNOSIS — Z79899 Other long term (current) drug therapy: Secondary | ICD-10-CM

## 2016-05-08 LAB — CUP PACEART INCLINIC DEVICE CHECK
Battery Impedance: 278 Ohm
Brady Statistic AP VS Percent: 0.1 % — CL
Brady Statistic AS VP Percent: 78 %
Brady Statistic AS VS Percent: 0.1 % — CL
Implantable Lead Implant Date: 20060116
Implantable Lead Location: 753858
Implantable Lead Location: 753859
Implantable Lead Model: 5071
Lead Channel Impedance Value: 478 Ohm
Lead Channel Pacing Threshold Amplitude: 0.75 V
Lead Channel Pacing Threshold Amplitude: 1.25 V
Lead Channel Pacing Threshold Pulse Width: 0.4 ms
Lead Channel Pacing Threshold Pulse Width: 0.4 ms
Lead Channel Sensing Intrinsic Amplitude: 4 mV
MDC IDC LEAD IMPLANT DT: 20060109
MDC IDC MSMT BATTERY REMAINING LONGEVITY: 90 mo
MDC IDC MSMT BATTERY VOLTAGE: 2.79 V
MDC IDC MSMT LEADCHNL RA IMPEDANCE VALUE: 432 Ohm
MDC IDC PG IMPLANT DT: 20140609
MDC IDC SESS DTM: 20180411162917
MDC IDC STAT BRADY AP VP PERCENT: 21.9 %

## 2016-05-08 LAB — POCT INR: INR: 1.9

## 2016-05-08 NOTE — Patient Instructions (Signed)
Dr Sallyanne Kuster recommends that you continue on your current medications as directed. Please refer to the Current Medication list given to you today.  Your physician recommends that you return for lab work at your earliest [place].  Remote monitoring is used to monitor your Pacemaker of ICD from home. This monitoring reduces the number of office visits required to check your device to one time per year. It allows [place] to keep an eye on the functioning of your device to ensure it is working properly. You are scheduled for a device check from home on Wednesday, July 11th, 2018. You may send your transmission at any time that day. If you have a wireless device, the transmission will be sent automatically. After your physician reviews your transmission, you will receive a postcard with your next transmission date.  Dr Sallyanne Kuster recommends that you schedule a follow-up appointment in 6 months with a pacemaker check. You will receive a reminder letter in the mail two months in advance. If you don't receive a letter, please call our office to schedule the follow-up appointment.  If you need a refill on your cardiac medications before your next appointment, please call your pharmacy.

## 2016-05-09 LAB — CBC
HEMATOCRIT: 42.3 % (ref 34.0–46.6)
Hemoglobin: 13.6 g/dL (ref 11.1–15.9)
MCH: 27.1 pg (ref 26.6–33.0)
MCHC: 32.2 g/dL (ref 31.5–35.7)
MCV: 84 fL (ref 79–97)
Platelets: 244 10*3/uL (ref 150–379)
RBC: 5.01 x10E6/uL (ref 3.77–5.28)
RDW: 15 % (ref 12.3–15.4)
WBC: 4.2 10*3/uL (ref 3.4–10.8)

## 2016-05-09 LAB — COMPREHENSIVE METABOLIC PANEL
ALT: 16 IU/L (ref 0–32)
AST: 17 IU/L (ref 0–40)
Albumin/Globulin Ratio: 1.8 (ref 1.2–2.2)
Albumin: 4.4 g/dL (ref 3.6–4.8)
Alkaline Phosphatase: 67 IU/L (ref 39–117)
BILIRUBIN TOTAL: 0.5 mg/dL (ref 0.0–1.2)
BUN / CREAT RATIO: 20 (ref 12–28)
BUN: 17 mg/dL (ref 8–27)
CHLORIDE: 102 mmol/L (ref 96–106)
CO2: 26 mmol/L (ref 18–29)
Calcium: 9.1 mg/dL (ref 8.7–10.3)
Creatinine, Ser: 0.86 mg/dL (ref 0.57–1.00)
GFR calc non Af Amer: 69 mL/min/{1.73_m2} (ref >59)
GFR, EST AFRICAN AMERICAN: 80 mL/min/{1.73_m2} (ref >59)
GLOBULIN, TOTAL: 2.5 g/dL (ref 1.5–4.5)
Glucose: 93 mg/dL (ref 65–99)
Potassium: 4.4 mmol/L (ref 3.5–5.2)
Sodium: 142 mmol/L (ref 134–144)
TOTAL PROTEIN: 6.9 g/dL (ref 6.0–8.5)

## 2016-05-09 LAB — LIPID PANEL
CHOLESTEROL TOTAL: 169 mg/dL (ref 100–199)
Chol/HDL Ratio: 3.3 ratio (ref 0.0–4.4)
HDL: 51 mg/dL (ref >39)
LDL CALC: 103 mg/dL — AB (ref 0–99)
TRIGLYCERIDES: 77 mg/dL (ref 0–149)
VLDL Cholesterol Cal: 15 mg/dL (ref 5–40)

## 2016-06-10 ENCOUNTER — Other Ambulatory Visit: Payer: Self-pay | Admitting: Cardiovascular Disease

## 2016-06-10 NOTE — Telephone Encounter (Signed)
REFILL 

## 2016-07-23 ENCOUNTER — Ambulatory Visit (INDEPENDENT_AMBULATORY_CARE_PROVIDER_SITE_OTHER): Payer: Medicare Other | Admitting: Pharmacist Clinician (PhC)/ Clinical Pharmacy Specialist

## 2016-07-23 DIAGNOSIS — I4891 Unspecified atrial fibrillation: Secondary | ICD-10-CM | POA: Diagnosis not present

## 2016-07-23 DIAGNOSIS — Z7901 Long term (current) use of anticoagulants: Secondary | ICD-10-CM

## 2016-07-23 DIAGNOSIS — I480 Paroxysmal atrial fibrillation: Secondary | ICD-10-CM

## 2016-07-23 LAB — POCT INR: INR: 2.2

## 2016-08-02 ENCOUNTER — Other Ambulatory Visit: Payer: Self-pay | Admitting: Cardiovascular Disease

## 2016-08-07 ENCOUNTER — Ambulatory Visit (INDEPENDENT_AMBULATORY_CARE_PROVIDER_SITE_OTHER): Payer: Medicare Other | Admitting: *Deleted

## 2016-08-07 DIAGNOSIS — I442 Atrioventricular block, complete: Secondary | ICD-10-CM

## 2016-08-07 NOTE — Progress Notes (Signed)
Remote pacemaker transmission.   

## 2016-08-13 ENCOUNTER — Encounter: Payer: Self-pay | Admitting: Cardiology

## 2016-08-19 LAB — CUP PACEART REMOTE DEVICE CHECK
Brady Statistic AP VS Percent: 0 %
Brady Statistic AS VS Percent: 0 %
Date Time Interrogation Session: 20180711111126
Implantable Lead Implant Date: 20060109
Implantable Lead Location: 753858
Implantable Lead Model: 5071
Implantable Lead Model: 5076
Lead Channel Impedance Value: 476 Ohm
Lead Channel Pacing Threshold Amplitude: 0.875 V
Lead Channel Pacing Threshold Pulse Width: 0.4 ms
Lead Channel Pacing Threshold Pulse Width: 0.4 ms
Lead Channel Setting Sensing Sensitivity: 4 mV
MDC IDC LEAD IMPLANT DT: 20060116
MDC IDC LEAD LOCATION: 753859
MDC IDC MSMT BATTERY IMPEDANCE: 302 Ohm
MDC IDC MSMT BATTERY REMAINING LONGEVITY: 95 mo
MDC IDC MSMT BATTERY VOLTAGE: 2.79 V
MDC IDC MSMT LEADCHNL RA IMPEDANCE VALUE: 432 Ohm
MDC IDC MSMT LEADCHNL RV PACING THRESHOLD AMPLITUDE: 1.125 V
MDC IDC PG IMPLANT DT: 20140609
MDC IDC SET LEADCHNL RA PACING AMPLITUDE: 2 V
MDC IDC SET LEADCHNL RV PACING AMPLITUDE: 2.5 V
MDC IDC SET LEADCHNL RV PACING PULSEWIDTH: 0.4 ms
MDC IDC STAT BRADY AP VP PERCENT: 26 %
MDC IDC STAT BRADY AS VP PERCENT: 74 %

## 2016-08-27 ENCOUNTER — Encounter: Payer: Self-pay | Admitting: Cardiology

## 2016-09-06 ENCOUNTER — Ambulatory Visit (INDEPENDENT_AMBULATORY_CARE_PROVIDER_SITE_OTHER): Payer: Medicare Other | Admitting: Pharmacist

## 2016-09-06 DIAGNOSIS — I480 Paroxysmal atrial fibrillation: Secondary | ICD-10-CM

## 2016-09-06 DIAGNOSIS — Z7901 Long term (current) use of anticoagulants: Secondary | ICD-10-CM

## 2016-09-06 DIAGNOSIS — I4891 Unspecified atrial fibrillation: Secondary | ICD-10-CM

## 2016-09-06 LAB — POCT INR: INR: 2.6

## 2016-09-26 ENCOUNTER — Other Ambulatory Visit: Payer: Self-pay | Admitting: Cardiovascular Disease

## 2016-11-04 ENCOUNTER — Ambulatory Visit (INDEPENDENT_AMBULATORY_CARE_PROVIDER_SITE_OTHER): Payer: Medicare Other | Admitting: Pharmacist Clinician (PhC)/ Clinical Pharmacy Specialist

## 2016-11-04 DIAGNOSIS — Z7901 Long term (current) use of anticoagulants: Secondary | ICD-10-CM | POA: Diagnosis not present

## 2016-11-04 DIAGNOSIS — I480 Paroxysmal atrial fibrillation: Secondary | ICD-10-CM

## 2016-11-04 DIAGNOSIS — I4891 Unspecified atrial fibrillation: Secondary | ICD-10-CM | POA: Diagnosis not present

## 2016-11-04 LAB — POCT INR: INR: 1.6

## 2016-11-05 ENCOUNTER — Other Ambulatory Visit: Payer: Self-pay | Admitting: Cardiovascular Disease

## 2016-11-06 ENCOUNTER — Telehealth: Payer: Self-pay | Admitting: Cardiovascular Disease

## 2016-11-06 ENCOUNTER — Encounter: Payer: Medicare Other | Admitting: *Deleted

## 2016-11-06 NOTE — Telephone Encounter (Signed)
Pt given number to [employer] to trouble shoot [phone] home monitor.

## 2016-11-06 NOTE — Telephone Encounter (Signed)
New Message  Pt needs assistance sending transmission. Please call back

## 2016-11-13 ENCOUNTER — Ambulatory Visit (INDEPENDENT_AMBULATORY_CARE_PROVIDER_SITE_OTHER): Payer: Medicare Other | Admitting: *Deleted

## 2016-11-13 DIAGNOSIS — I442 Atrioventricular block, complete: Secondary | ICD-10-CM | POA: Diagnosis not present

## 2016-11-14 LAB — CUP PACEART REMOTE DEVICE CHECK
Implantable Lead Implant Date: 20060116
Implantable Lead Location: 753859
Implantable Lead Model: 5076
Lead Channel Setting Pacing Amplitude: 2 V
Lead Channel Setting Pacing Amplitude: 2.5 V
Lead Channel Setting Pacing Pulse Width: 0.4 ms
Lead Channel Setting Sensing Sensitivity: 4 mV
MDC IDC LEAD IMPLANT DT: 20060109
MDC IDC LEAD LOCATION: 753858
MDC IDC PG IMPLANT DT: 20140609
MDC IDC SESS DTM: 20181109115838

## 2016-11-14 NOTE — Progress Notes (Signed)
Remote pacemaker transmission.   

## 2016-11-15 ENCOUNTER — Encounter: Payer: Self-pay | Admitting: Cardiology

## 2016-11-25 ENCOUNTER — Ambulatory Visit (INDEPENDENT_AMBULATORY_CARE_PROVIDER_SITE_OTHER): Payer: Medicare Other | Admitting: Pharmacist

## 2016-11-25 DIAGNOSIS — Z7901 Long term (current) use of anticoagulants: Secondary | ICD-10-CM

## 2016-11-25 DIAGNOSIS — I480 Paroxysmal atrial fibrillation: Secondary | ICD-10-CM

## 2016-11-25 DIAGNOSIS — I4891 Unspecified atrial fibrillation: Secondary | ICD-10-CM

## 2016-11-25 LAB — POCT INR: INR: 2.1

## 2016-12-17 ENCOUNTER — Telehealth: Payer: Self-pay | Admitting: Cardiovascular Disease

## 2016-12-17 NOTE — Telephone Encounter (Signed)
Closed Encounter  °

## 2017-01-08 ENCOUNTER — Ambulatory Visit (INDEPENDENT_AMBULATORY_CARE_PROVIDER_SITE_OTHER): Payer: Medicare Other | Admitting: Pharmacist Clinician (PhC)/ Clinical Pharmacy Specialist

## 2017-01-08 DIAGNOSIS — I4891 Unspecified atrial fibrillation: Secondary | ICD-10-CM | POA: Diagnosis not present

## 2017-01-08 DIAGNOSIS — I480 Paroxysmal atrial fibrillation: Secondary | ICD-10-CM

## 2017-01-08 DIAGNOSIS — Z7901 Long term (current) use of anticoagulants: Secondary | ICD-10-CM | POA: Diagnosis not present

## 2017-01-08 LAB — POCT INR: INR: 2.7

## 2017-02-05 ENCOUNTER — Encounter: Payer: Medicare Other | Admitting: Cardiovascular Disease

## 2017-02-12 ENCOUNTER — Telehealth: Payer: Self-pay | Admitting: Cardiology

## 2017-02-12 ENCOUNTER — Encounter: Payer: Medicare Other | Admitting: *Deleted

## 2017-02-12 NOTE — Telephone Encounter (Signed)
LMOVM reminding pt to send remote transmission.   

## 2017-02-13 ENCOUNTER — Encounter: Payer: Self-pay | Admitting: Cardiology

## 2017-02-17 ENCOUNTER — Ambulatory Visit (INDEPENDENT_AMBULATORY_CARE_PROVIDER_SITE_OTHER): Payer: Medicare Other | Admitting: *Deleted

## 2017-02-17 DIAGNOSIS — I442 Atrioventricular block, complete: Secondary | ICD-10-CM

## 2017-02-17 NOTE — Progress Notes (Signed)
Remote pacemaker transmission.   

## 2017-02-18 ENCOUNTER — Encounter: Payer: Self-pay | Admitting: Cardiology

## 2017-02-18 LAB — CUP PACEART REMOTE DEVICE CHECK
Battery Remaining Longevity: 88 mo
Battery Voltage: 2.79 V
Brady Statistic AP VP Percent: 27 %
Brady Statistic AP VS Percent: 0 %
Brady Statistic AS VP Percent: 73 %
Implantable Lead Implant Date: 20060109
Implantable Lead Location: 753858
Implantable Lead Model: 5071
Implantable Pulse Generator Implant Date: 20140609
Lead Channel Impedance Value: 432 Ohm
Lead Channel Impedance Value: 475 Ohm
Lead Channel Pacing Threshold Amplitude: 0.875 V
Lead Channel Pacing Threshold Amplitude: 1.25 V
Lead Channel Pacing Threshold Pulse Width: 0.4 ms
Lead Channel Pacing Threshold Pulse Width: 0.4 ms
MDC IDC LEAD IMPLANT DT: 20060116
MDC IDC LEAD LOCATION: 753859
MDC IDC MSMT BATTERY IMPEDANCE: 375 Ohm
MDC IDC SESS DTM: 20190121132359
MDC IDC SET LEADCHNL RA PACING AMPLITUDE: 2 V
MDC IDC SET LEADCHNL RV PACING AMPLITUDE: 2.5 V
MDC IDC SET LEADCHNL RV PACING PULSEWIDTH: 0.4 ms
MDC IDC SET LEADCHNL RV SENSING SENSITIVITY: 4 mV
MDC IDC STAT BRADY AS VS PERCENT: 0 %

## 2017-02-19 ENCOUNTER — Encounter: Payer: Self-pay | Admitting: Cardiovascular Disease

## 2017-02-19 ENCOUNTER — Ambulatory Visit: Payer: Medicare Other | Admitting: Cardiovascular Disease

## 2017-02-19 ENCOUNTER — Ambulatory Visit (INDEPENDENT_AMBULATORY_CARE_PROVIDER_SITE_OTHER): Payer: Medicare Other | Admitting: Pharmacist

## 2017-02-19 VITALS — BP 134/79 | HR 83 | Ht 63.0 in | Wt 207.2 lb

## 2017-02-19 DIAGNOSIS — Z7901 Long term (current) use of anticoagulants: Secondary | ICD-10-CM

## 2017-02-19 DIAGNOSIS — I442 Atrioventricular block, complete: Secondary | ICD-10-CM | POA: Diagnosis not present

## 2017-02-19 DIAGNOSIS — I480 Paroxysmal atrial fibrillation: Secondary | ICD-10-CM | POA: Diagnosis not present

## 2017-02-19 DIAGNOSIS — Z950 Presence of cardiac pacemaker: Secondary | ICD-10-CM

## 2017-02-19 DIAGNOSIS — I4891 Unspecified atrial fibrillation: Secondary | ICD-10-CM | POA: Diagnosis not present

## 2017-02-19 DIAGNOSIS — Z9889 Other specified postprocedural states: Secondary | ICD-10-CM | POA: Diagnosis not present

## 2017-02-19 DIAGNOSIS — I5042 Chronic combined systolic (congestive) and diastolic (congestive) heart failure: Secondary | ICD-10-CM

## 2017-02-19 DIAGNOSIS — Z79899 Other long term (current) drug therapy: Secondary | ICD-10-CM

## 2017-02-19 DIAGNOSIS — Z5181 Encounter for therapeutic drug level monitoring: Secondary | ICD-10-CM

## 2017-02-19 LAB — POCT INR: INR: 1.5

## 2017-02-19 NOTE — Progress Notes (Signed)
Patient ID: [name], female   DOB: Jun 25, 1945, 72 y.o.   MRN: [id number]    Cardiology Office Note    Date:  02/19/2017   ID:  [name], DOB 1945-10-24, MRN [id number]  PCP:  Nanci Pina, FNP  Cardiologist:   Sanda Klein, MD   Chief Complaint  Patient presents with  . Follow-up    History of Present Illness:  [name] is a 72 y.o. female status post mitral valve annuloplasty, with sinus node dysfunction, high-grade AV block, paroxysmal atrial fibrillation on warfarin, here for clinical and pacemaker follow-up.  Overall she is feeling quite well and does not feel at all restricted in daily activities.  She has chronic mild exertional dyspnea (functional class II.  The patient specifically denies any chest pain at rest exertion, dyspnea at rest, orthopnea, paroxysmal nocturnal dyspnea, syncope, palpitations, focal neurological deficits, intermittent claudication, lower extremity edema, unexplained weight gain, cough, hemoptysis or wheezing.  Pacemaker interrogation shows normal device function, estimated generator longevity unchanged since her last appointment at 7.5 years, normal lead parameters, 0.3 % atrial pacing and 99.8 % ventricular pacing. She has had frequent but mostly brief episodes of atrial mode switch with an overall burden of 5%.  Clear atrial fibrillation lasting for several hours was documented in February and May 2018, but she also has longer episodes of sustained atrial tachycardia with a rate of around 150 bpm. There have been no meaningful episodes of ventricular tachycardia  There is no underlying escape rhythm when pacing this taken down to 30 bpm.  She is pacemaker dependent.  Echo September 2017 shows mildly depressed left ventricular systolic function with EF of 45-50%, at least in part secondary to pacing induced systolic dyssynchrony. There was echo evidence of elevated filling pressures due to grade 2 diastolic dysfunction. The left  atrium is mildly dilated with end systolic diameter of 41 mm. there was no evidence of mitral insufficiency and her annuloplasty was not associated with significantly increased mitral gradients.  [name] had mitral valve annuloplasty for severe mitral insufficiency in 2006.  The left atrial appendage was ligated during that procedure.  She did not have coronary disease by preoperative angiography and has normal left ventricular systolic function. Followup echocardiography 2015 and 2017 shows borderline low left ventricular ejection fraction (45-50%). Valve repair looks good. She had a dual chamber permanent pacemaker (Medtronic) implanted in 2006 and has complete heart block . The right atrial lead is a conventional transvenous lead. The ventricular lead is an active fixation epicardial lead on the surface of the left ventricle placed at the time of her surgery. A generator change was performed in 2014. Her pacemaker is located in a deep subpectoral pocket. She has paroxysmal atrial fibrillation. She has treated hypertension, dyslipidemia and type 2 diabetes mellitus on the background of severe obesity.   Past Medical History:  Diagnosis Date  . Arthritis   . Atrial fibrillation ([place])    PAF---currently anticoagulated with Warfarin  . Atrial flutter ([place]) 02/21/2004   s/p mitral valve repair and ppm insertion  . Congenital heart block 08/26/2003   Diagnosed by Dr. Clovis Fredrickson. A temp orary pacemaker was placed in 1988 during hysterectomy procedure.  . Diabetes mellitus without complication (HCC)    prediabetic  . Diverticulosis   . Dyslipidemia    takes Niacin and Crestor daily  . History of blood transfusion    no abnormal reaction noted  . Hypothyroidism    takes Synthroid daily  .  Mitral valve insufficiency    Recieved Mitral valve repair with reconstruction of the anterior leaflet cordis with cortex and ring annuloplasty  . Nonischemic cardiomyopathy ([place]) 2006   EF of 35% due to  AV dyssyncrony. Has since resolved per most recent ECHO.  Marland Kitchen RBBB    Due the VP with the LV epicardial lead that was inserted in 2006 during the mitral repair  . Systemic hypertension    takes  Ramipril daily-Maxzide every other day  . Urinary urgency     Past Surgical History:  Procedure Laterality Date  . ABDOMINAL HYSTERECTOMY    . BREAST LUMPECTOMY WITH RADIOACTIVE SEED LOCALIZATION Right 11/22/2014   Procedure: RIGHT BREAST LUMPECTOMY WITH RADIOACTIVE SEED LOCALIZATION;  Surgeon: Fanny Skates, MD;  Location: [facility];  Service: General;  Laterality: Right;  . CARDIAC CATHETERIZATION  10/20/2003   Minimal decrease in the LV systolic EF of 16-10% with global hyperkinesis. Moderately dilated LV. 3+ Mitral regurgitation. Underfilling of LAD which promptly improved with intracoronary NTG administration. Microvascular angina or microvascular spasm. Otherwise coronaries were normal. No intervention necessary.  Marland Kitchen CARDIOVASCULAR STRESS TEST  09/14/2003   Performed for the pt's congenital CHB that was discovered during a preoperative evaluation for a colonoscopy.  EKG demonstrated CHB with junctional escape. Nonspecific ST changes noted. Stress ECG was positive for ischemia(2 mm ST depression). Dilated LV cavity. Inability to calculate systolic function. High risk scan. This scan was followed up with a cardiac cath.  . COLONOSCOPY    . MITRAL VALVE ANNULOPLASTY  02/06/2004   Performed for severe MR requiring mitral valve repair with anterior leaflet reconstruction and a [place] annuloplasty ring--model: [id number], and an 80% lesion reduction (Dr. Servando Snare). Two LV leads were placed epicardially just in the event that biv pacing would be needed. (One of the leads had been used for during the ppm insertion recieved one week later)   . PERMANENT PACEMAKER GENERATOR CHANGE  07/06/2012   Medtronic  . PERMANENT PACEMAKER GENERATOR CHANGE N/A 07/06/2012   Procedure: PERMANENT PACEMAKER  GENERATOR CHANGE;  Surgeon: Sanda Klein, MD;  Location: [facility];  Service: Cardiovascular;  Laterality: N/A;  . PERMANENT PACEMAKER INSERTION  02/13/2004   PPM implant following a mitral valve annuloplasty. An epicardial active fixation lead (implanted on 02/06/2004 during the mitral valve procedure) was used for this implant.   . US ECHOCARDIOGRAPHY  09/10/2010 and1/07/2008   The last echo showed an EF of >55%; RV mildly dilated. Mild to moderately dilated LA  with borderline RA dilation as well. Impaired diastolic function. Normal RV systolic function.MVR without residual insufficiency.No Pericardial effusion.    Current Medications: Outpatient Medications Prior to Visit  Medication Sig Dispense Refill  . acetaminophen (TYLENOL) 325 MG tablet Take 650 mg by mouth as needed for pain.    . cholecalciferol (VITAMIN D) 400 UNITS TABS Take 400 Units by mouth daily.    . folic acid (FOLVITE) 1 MG tablet TAKE 1 TABLET(1 MG) BY MOUTH DAILY 90 tablet 3  . HYDROcodone-acetaminophen (NORCO) 5-325 MG tablet Take 1-2 tablets by mouth every 6 (six) hours as needed for moderate pain or severe pain. 30 tablet 0  . levothyroxine (SYNTHROID, LEVOTHROID) 75 MCG tablet Take 75 mcg by mouth daily before breakfast.    . niacin 500 MG tablet Take 500 mg by mouth every evening.     . Omega-3 Fatty Acids (OMEGA 3 PO) Take 1,000 mg by mouth daily.    . ramipril (ALTACE) 10 MG capsule  TAKE 1 CAPSULE BY MOUTH AT BEDTIME 30 capsule 11  . rosuvastatin (CRESTOR) 10 MG tablet Take 1 tablet (10 mg total) by mouth daily. 28 tablet 0  . sotalol (BETAPACE) 80 MG tablet TAKE 1 TABLET BY MOUTH TWICE DAILY 60 tablet 11  . triamterene-hydrochlorothiazide (MAXZIDE-25) 37.5-25 MG tablet Take 0.5 tablets by mouth every other day.    . triamterene-hydrochlorothiazide (MAXZIDE-25) 37.5-25 MG tablet TAKE 1/2 TABLET BY MOUTH EVERY DAY AS DIRECTED 15 tablet 11  . warfarin (COUMADIN) 5 MG tablet TAKE 1 TO 1 AND 1/2 TABLETS BY MOUTH EVERY  DAY AS DIRECTED BY COUMADIN CLINIC 135 tablet 0   No facility-administered medications prior to visit.      Allergies:   Patient has no known allergies.   Social History   Socioeconomic History  . Marital status: Single    Spouse name: None  . Number of children: None  . Years of education: None  . Highest education level: None  Social Needs  . Financial resource strain: None  . Food insecurity - worry: None  . Food insecurity - inability: None  . Transportation needs - medical: None  . Transportation needs - non-medical: None  Occupational History  . None  Tobacco Use  . Smoking status: Former Smoker    Types: Cigarettes  . Smokeless tobacco: Never Used  . Tobacco comment: quit smoking in 2006  Substance and Sexual Activity  . Alcohol use: Yes    Comment: wine occasionally  . Drug use: No  . Sexual activity: Yes    Birth control/protection: Surgical  Other Topics Concern  . None  Social History Narrative  . None     Family History:  The patient's family history includes Asthma in her brother and sister; Cancer - Other in her mother; Healthy in her brother, brother, brother, and brother; Pneumonia in her father.   ROS:   Please see the history of present illness.    ROS All other systems reviewed and are negative.   PHYSICAL EXAM:   VS:  BP 134/79   Pulse 83   Ht 5\' 3"  (1.6 m)   Wt 207 lb 3.2 oz (94 kg)   BMI 36.70 kg/m     General: Alert, oriented x3, no distress, moderately obese Head: no evidence of trauma, PERRL, EOMI, no exophtalmos or lid lag, no myxedema, no xanthelasma; normal ears, nose and oropharynx Neck: normal jugular venous pulsations and no hepatojugular reflux; brisk carotid pulses without delay and no carotid bruits Chest: clear to auscultation, no signs of consolidation by percussion or palpation, normal fremitus, symmetrical and full respiratory excursions.  Healthy left subclavian pacemaker site Cardiovascular: normal position and quality  of the apical impulse, regular rhythm, normal first and paradoxically split second heart sounds, no murmurs, rubs or gallops Abdomen: no tenderness or distention, no masses by palpation, no abnormal pulsatility or arterial bruits, normal bowel sounds, no hepatosplenomegaly Extremities: no clubbing, cyanosis or edema; 2+ radial, ulnar and brachial pulses bilaterally; 2+ right femoral, posterior tibial and dorsalis pedis pulses; 2+ left femoral, posterior tibial and dorsalis pedis pulses; no subclavian or femoral bruits Neurological: grossly nonfocal Psych: Normal mood and affect   Wt Readings from Last 3 Encounters:  02/19/17 207 lb 3.2 oz (94 kg)  05/08/16 212 lb (96.2 kg)  10/31/15 213 lb 12.8 oz (97 kg)      Studies/Labs Reviewed:   EKG:  EKG is  ordered today.  The ekg ordered today demonstrates atrial paced, ventricular paced rhythm.  She has prominent positive R waves in the septal leads consistent with the epicardial pacing.  QTc 561 ms (557- 583 ms throughout the last year)  Recent Labs: 05/08/2016: ALT 16; BUN 17; Creatinine, Ser 0.86; Hemoglobin 13.6; Platelets 244; Potassium 4.4; Sodium 142     ASSESSMENT:    1. Paroxysmal atrial fibrillation (HCC)   2. Chronic combined systolic and diastolic heart failure ([place])   3. Complete heart block ([place])   4. Pacemaker   5. Status post mitral valve annuloplasty   6. Long term current use of anticoagulant   7. Encounter for monitoring sotalol therapy      PLAN:  In order of problems listed above:  1. Afib: Her overall burden of atrial arrhythmias around 5%, although more commonly she has a regular atrial tachycardia around 150-160 bpm, which I suspect is an atriotomy scar left atrial flutter.  Atrial fibrillation is less frequent.  She is appropriately anticoagulated with warfarin. 2. CHF: EF 45-50%, some variation in evaluation, but direct image comparison shows little change. Preop EF was 40-45%, so current EF is expected. On  ACE inhibitor and "beta blocker" (sotalol).  Appears clinically euvolemic, NYHA functional class II 3. CHB: pacemaker dependent. Her V pacing lead is an epicardial LV lead. Upgrade to CRT is therefore less likely to be of benefit. 4. PM: normal device function.  Remote downloads every 3 months and yearly office visit 5. MR s/p annuloplasty: she has had a durable and good result from her annuloplasty 6. Warfarin: Although her embolic risk would appear to be quite high due to the presence of left ventricular dysfunction valvular heart disease should be noted that she also had surgical closure or her left atrial appendage at the time of mitral surgery and only mildly data left atrium. CHADSVasc at least 17 (age, gender, LV dysfunction/CHF, hypertension) 7. Sotalol: QT is prolonged in large part due to a very broad paced QRS complex.  Need to check renal function and potassium level at least every 6 months.  No new episodes of nonsustained VT recorded.  Reminded her about the multiple drugs that could potentially interact with sotalol and warfarin.   Medication Adjustments/Labs and Tests Ordered: Current medicines are reviewed at length with the patient today.  Concerns regarding medicines are outlined above.  Medication changes, Labs and Tests ordered today are listed in the Patient Instructions below. Patient Instructions  Dr Sallyanne Kuster recommends that you continue on your current medications as directed. Please refer to the Current Medication list given to you today.  Your physician recommends that you return for lab work TODAY.  Remote monitoring is used to monitor your Pacemaker or ICD from home. This monitoring reduces the number of office visits required to check your device to one time per year. It allows [place] to keep an eye on the functioning of your device to ensure it is working properly. You are scheduled for a device check from home on Monday, April 22nd, 2019. You may send your transmission at  any time that day. If you have a wireless device, the transmission will be sent automatically. After your physician reviews your transmission, you will receive a notification with your next transmission date.  Dr Sallyanne Kuster recommends that you schedule a follow-up appointment in 12 months with a pacemaker check. You will receive a reminder letter in the mail two months in advance. If you don't receive a letter, please call our office to schedule the follow-up appointment.  If you need a refill on  your cardiac medications before your next appointment, please call your pharmacy.    Signed, Sanda Klein, MD  02/19/2017 3:16 PM    [facility] [place], [place], [place]  [address] Phone: [phone]; Fax: [phone]

## 2017-02-19 NOTE — Patient Instructions (Signed)
Dr Sallyanne Kuster recommends that you continue on your current medications as directed. Please refer to the Current Medication list given to you today.  Your physician recommends that you return for lab work TODAY.  Remote monitoring is used to monitor your Pacemaker or ICD from home. This monitoring reduces the number of office visits required to check your device to one time per year. It allows [place] to keep an eye on the functioning of your device to ensure it is working properly. You are scheduled for a device check from home on Monday, April 22nd, 2019. You may send your transmission at any time that day. If you have a wireless device, the transmission will be sent automatically. After your physician reviews your transmission, you will receive a notification with your next transmission date.  Dr Sallyanne Kuster recommends that you schedule a follow-up appointment in 12 months with a pacemaker check. You will receive a reminder letter in the mail two months in advance. If you don't receive a letter, please call our office to schedule the follow-up appointment.  If you need a refill on your cardiac medications before your next appointment, please call your pharmacy.

## 2017-02-20 LAB — BASIC METABOLIC PANEL
BUN / CREAT RATIO: 20 (ref 12–28)
BUN: 16 mg/dL (ref 8–27)
CALCIUM: 9.5 mg/dL (ref 8.7–10.3)
CHLORIDE: 100 mmol/L (ref 96–106)
CO2: 27 mmol/L (ref 20–29)
Creatinine, Ser: 0.81 mg/dL (ref 0.57–1.00)
GFR calc non Af Amer: 74 mL/min/{1.73_m2} (ref >59)
GFR, EST AFRICAN AMERICAN: 85 mL/min/{1.73_m2} (ref >59)
GLUCOSE: 92 mg/dL (ref 65–99)
POTASSIUM: 4.1 mmol/L (ref 3.5–5.2)
Sodium: 140 mmol/L (ref 134–144)

## 2017-02-20 LAB — TSH: TSH: 5.47 u[IU]/mL — AB (ref 0.450–4.500)

## 2017-03-11 ENCOUNTER — Ambulatory Visit (INDEPENDENT_AMBULATORY_CARE_PROVIDER_SITE_OTHER): Payer: Medicare Other | Admitting: Pharmacist Clinician (PhC)/ Clinical Pharmacy Specialist

## 2017-03-11 DIAGNOSIS — Z7901 Long term (current) use of anticoagulants: Secondary | ICD-10-CM | POA: Diagnosis not present

## 2017-03-11 DIAGNOSIS — I4891 Unspecified atrial fibrillation: Secondary | ICD-10-CM

## 2017-03-11 DIAGNOSIS — I480 Paroxysmal atrial fibrillation: Secondary | ICD-10-CM

## 2017-03-11 LAB — POCT INR: INR: 1.6

## 2017-03-19 LAB — CUP PACEART INCLINIC DEVICE CHECK
Date Time Interrogation Session: 20190220165800
Implantable Lead Implant Date: 20060116
Implantable Lead Location: 753858
Implantable Lead Model: 5071
Implantable Lead Model: 5076
Lead Channel Setting Pacing Amplitude: 2 V
Lead Channel Setting Pacing Pulse Width: 0.4 ms
MDC IDC LEAD IMPLANT DT: 20060109
MDC IDC LEAD LOCATION: 753859
MDC IDC PG IMPLANT DT: 20140609
MDC IDC SET LEADCHNL RV PACING AMPLITUDE: 2.5 V
MDC IDC SET LEADCHNL RV SENSING SENSITIVITY: 4 mV

## 2017-03-25 ENCOUNTER — Other Ambulatory Visit: Payer: Self-pay | Admitting: Cardiovascular Disease

## 2017-03-25 ENCOUNTER — Ambulatory Visit (INDEPENDENT_AMBULATORY_CARE_PROVIDER_SITE_OTHER): Payer: Medicare Other | Admitting: Pharmacist Clinician (PhC)/ Clinical Pharmacy Specialist

## 2017-03-25 DIAGNOSIS — I4891 Unspecified atrial fibrillation: Secondary | ICD-10-CM | POA: Diagnosis not present

## 2017-03-25 DIAGNOSIS — Z7901 Long term (current) use of anticoagulants: Secondary | ICD-10-CM

## 2017-03-25 DIAGNOSIS — I480 Paroxysmal atrial fibrillation: Secondary | ICD-10-CM

## 2017-03-25 LAB — POCT INR: INR: 2.5

## 2017-03-25 NOTE — Patient Instructions (Signed)
Description   Continue with 1.5 tablets daily except 1 tablet each Monday and Friday.  Repeat INR in 4 weeks

## 2017-03-26 ENCOUNTER — Telehealth: Payer: Self-pay

## 2017-03-26 DIAGNOSIS — I480 Paroxysmal atrial fibrillation: Secondary | ICD-10-CM

## 2017-03-26 DIAGNOSIS — I442 Atrioventricular block, complete: Secondary | ICD-10-CM

## 2017-03-26 NOTE — Telephone Encounter (Signed)
Notes recorded by Sanda Klein, MD on 02/20/2017 at 9:04 AM EST Labs are generally normal. The TSH suggests that the dose of thyroid hormone may be insufficient, not excessive (therefore can't explain the arrhythmia). I would suggest rechecking the TSH and a free T4 level in about a month.   Repeat labs ordered. Mailed to patient.

## 2017-04-05 LAB — T4, FREE: FREE T4: 1.51 ng/dL (ref 0.82–1.77)

## 2017-04-05 LAB — TSH: TSH: 4.51 u[IU]/mL — ABNORMAL HIGH (ref 0.450–4.500)

## 2017-04-22 ENCOUNTER — Ambulatory Visit (INDEPENDENT_AMBULATORY_CARE_PROVIDER_SITE_OTHER): Payer: Medicare Other | Admitting: Pharmacist

## 2017-04-22 DIAGNOSIS — I480 Paroxysmal atrial fibrillation: Secondary | ICD-10-CM

## 2017-04-22 DIAGNOSIS — Z7901 Long term (current) use of anticoagulants: Secondary | ICD-10-CM

## 2017-04-22 DIAGNOSIS — I4891 Unspecified atrial fibrillation: Secondary | ICD-10-CM | POA: Diagnosis not present

## 2017-04-22 LAB — POCT INR: INR: 3

## 2017-05-19 ENCOUNTER — Ambulatory Visit (INDEPENDENT_AMBULATORY_CARE_PROVIDER_SITE_OTHER): Payer: Medicare Other | Admitting: *Deleted

## 2017-05-19 DIAGNOSIS — I442 Atrioventricular block, complete: Secondary | ICD-10-CM

## 2017-05-19 NOTE — Progress Notes (Signed)
Remote pacemaker transmission.   

## 2017-05-20 ENCOUNTER — Encounter: Payer: Self-pay | Admitting: Cardiology

## 2017-05-20 ENCOUNTER — Ambulatory Visit (INDEPENDENT_AMBULATORY_CARE_PROVIDER_SITE_OTHER): Payer: Medicare Other | Admitting: Pharmacist

## 2017-05-20 DIAGNOSIS — Z7901 Long term (current) use of anticoagulants: Secondary | ICD-10-CM | POA: Diagnosis not present

## 2017-05-20 DIAGNOSIS — I4891 Unspecified atrial fibrillation: Secondary | ICD-10-CM

## 2017-05-20 DIAGNOSIS — I480 Paroxysmal atrial fibrillation: Secondary | ICD-10-CM | POA: Diagnosis not present

## 2017-05-20 LAB — CUP PACEART REMOTE DEVICE CHECK
Battery Impedance: 449 Ohm
Battery Remaining Longevity: 77 mo
Battery Voltage: 2.8 V
Brady Statistic AP VP Percent: 28 %
Brady Statistic AP VS Percent: 0 %
Date Time Interrogation Session: 20190422125509
Implantable Lead Implant Date: 20060116
Implantable Lead Location: 753859
Implantable Lead Model: 5071
Implantable Lead Model: 5076
Implantable Pulse Generator Implant Date: 20140609
Lead Channel Pacing Threshold Pulse Width: 0.4 ms
Lead Channel Setting Pacing Amplitude: 2 V
Lead Channel Setting Pacing Amplitude: 2.75 V
Lead Channel Setting Pacing Pulse Width: 0.4 ms
Lead Channel Setting Sensing Sensitivity: 4 mV
MDC IDC LEAD IMPLANT DT: 20060109
MDC IDC LEAD LOCATION: 753858
MDC IDC MSMT LEADCHNL RA IMPEDANCE VALUE: 432 Ohm
MDC IDC MSMT LEADCHNL RA PACING THRESHOLD AMPLITUDE: 0.875 V
MDC IDC MSMT LEADCHNL RV IMPEDANCE VALUE: 492 Ohm
MDC IDC MSMT LEADCHNL RV PACING THRESHOLD AMPLITUDE: 1.375 V
MDC IDC MSMT LEADCHNL RV PACING THRESHOLD PULSEWIDTH: 0.4 ms
MDC IDC STAT BRADY AS VP PERCENT: 72 %
MDC IDC STAT BRADY AS VS PERCENT: 0 %

## 2017-05-20 LAB — POCT INR: INR: 3.2

## 2017-06-10 ENCOUNTER — Ambulatory Visit (INDEPENDENT_AMBULATORY_CARE_PROVIDER_SITE_OTHER): Payer: Medicare Other | Admitting: Pharmacist Clinician (PhC)/ Clinical Pharmacy Specialist

## 2017-06-10 DIAGNOSIS — I480 Paroxysmal atrial fibrillation: Secondary | ICD-10-CM

## 2017-06-10 DIAGNOSIS — Z7901 Long term (current) use of anticoagulants: Secondary | ICD-10-CM | POA: Diagnosis not present

## 2017-06-10 DIAGNOSIS — I4891 Unspecified atrial fibrillation: Secondary | ICD-10-CM | POA: Diagnosis not present

## 2017-06-10 LAB — POCT INR: INR: 2.3

## 2017-06-10 NOTE — Patient Instructions (Signed)
Description   Continue with 1.5 tablets daily except 1 tablet each Monday and Friday. Repeat INR in 6 weeks.     

## 2017-06-19 ENCOUNTER — Other Ambulatory Visit: Payer: Self-pay | Admitting: Cardiovascular Disease

## 2017-06-27 ENCOUNTER — Other Ambulatory Visit: Payer: Self-pay | Admitting: Cardiovascular Disease

## 2017-06-27 NOTE — Telephone Encounter (Signed)
REFILL 

## 2017-07-14 ENCOUNTER — Other Ambulatory Visit: Payer: Self-pay | Admitting: Cardiovascular Disease

## 2017-07-16 ENCOUNTER — Ambulatory Visit (INDEPENDENT_AMBULATORY_CARE_PROVIDER_SITE_OTHER): Payer: Medicare Other | Admitting: Pharmacist

## 2017-07-16 DIAGNOSIS — I4891 Unspecified atrial fibrillation: Secondary | ICD-10-CM

## 2017-07-16 DIAGNOSIS — I480 Paroxysmal atrial fibrillation: Secondary | ICD-10-CM

## 2017-07-16 DIAGNOSIS — Z7901 Long term (current) use of anticoagulants: Secondary | ICD-10-CM

## 2017-07-16 LAB — POCT INR: INR: 2.6 (ref 2.0–3.0)

## 2017-07-17 ENCOUNTER — Other Ambulatory Visit: Payer: Self-pay | Admitting: Cardiovascular Disease

## 2017-08-18 ENCOUNTER — Ambulatory Visit (INDEPENDENT_AMBULATORY_CARE_PROVIDER_SITE_OTHER): Payer: Medicare Other | Admitting: *Deleted

## 2017-08-18 DIAGNOSIS — I442 Atrioventricular block, complete: Secondary | ICD-10-CM | POA: Diagnosis not present

## 2017-08-18 NOTE — Progress Notes (Signed)
Remote pacemaker transmission.   

## 2017-08-19 ENCOUNTER — Encounter: Payer: Self-pay | Admitting: Cardiology

## 2017-08-26 ENCOUNTER — Ambulatory Visit (INDEPENDENT_AMBULATORY_CARE_PROVIDER_SITE_OTHER): Payer: Medicare Other | Admitting: Pharmacist

## 2017-08-26 DIAGNOSIS — Z7901 Long term (current) use of anticoagulants: Secondary | ICD-10-CM | POA: Diagnosis not present

## 2017-08-26 DIAGNOSIS — I480 Paroxysmal atrial fibrillation: Secondary | ICD-10-CM | POA: Diagnosis not present

## 2017-08-26 LAB — POCT INR: INR: 3 (ref 2.0–3.0)

## 2017-09-11 LAB — CUP PACEART REMOTE DEVICE CHECK
Brady Statistic AP VP Percent: 27 %
Brady Statistic AP VS Percent: 0 %
Brady Statistic AS VP Percent: 72 %
Brady Statistic AS VS Percent: 0 %
Implantable Lead Implant Date: 20060109
Implantable Lead Model: 5071
Implantable Pulse Generator Implant Date: 20140609
Lead Channel Impedance Value: 421 Ohm
Lead Channel Impedance Value: 483 Ohm
Lead Channel Pacing Threshold Amplitude: 0.875 V
Lead Channel Pacing Threshold Amplitude: 1.125 V
MDC IDC LEAD IMPLANT DT: 20060116
MDC IDC LEAD LOCATION: 753858
MDC IDC LEAD LOCATION: 753859
MDC IDC MSMT BATTERY IMPEDANCE: 449 Ohm
MDC IDC MSMT BATTERY REMAINING LONGEVITY: 82 mo
MDC IDC MSMT BATTERY VOLTAGE: 2.8 V
MDC IDC MSMT LEADCHNL RA PACING THRESHOLD PULSEWIDTH: 0.4 ms
MDC IDC MSMT LEADCHNL RV PACING THRESHOLD PULSEWIDTH: 0.4 ms
MDC IDC SESS DTM: 20190722122748
MDC IDC SET LEADCHNL RA PACING AMPLITUDE: 2 V
MDC IDC SET LEADCHNL RV PACING AMPLITUDE: 2.5 V
MDC IDC SET LEADCHNL RV PACING PULSEWIDTH: 0.4 ms
MDC IDC SET LEADCHNL RV SENSING SENSITIVITY: 4 mV

## 2017-10-07 ENCOUNTER — Ambulatory Visit (INDEPENDENT_AMBULATORY_CARE_PROVIDER_SITE_OTHER): Payer: Medicare Other | Admitting: Pharmacist Clinician (PhC)/ Clinical Pharmacy Specialist

## 2017-10-07 DIAGNOSIS — Z7901 Long term (current) use of anticoagulants: Secondary | ICD-10-CM

## 2017-10-07 DIAGNOSIS — I480 Paroxysmal atrial fibrillation: Secondary | ICD-10-CM | POA: Diagnosis not present

## 2017-10-07 LAB — POCT INR: INR: 3 (ref 2.0–3.0)

## 2017-10-07 NOTE — Patient Instructions (Signed)
Description   Continue with 1.5 tablets daily except 1 tablet each Monday and Friday. Repeat INR in 6 weeks.

## 2017-10-13 ENCOUNTER — Other Ambulatory Visit: Payer: Self-pay | Admitting: Cardiovascular Disease

## 2017-10-28 ENCOUNTER — Other Ambulatory Visit: Payer: Self-pay | Admitting: Cardiovascular Disease

## 2017-11-17 ENCOUNTER — Ambulatory Visit (INDEPENDENT_AMBULATORY_CARE_PROVIDER_SITE_OTHER): Payer: Medicare Other | Admitting: *Deleted

## 2017-11-17 DIAGNOSIS — I442 Atrioventricular block, complete: Secondary | ICD-10-CM

## 2017-11-17 NOTE — Progress Notes (Signed)
Remote pacemaker transmission.   

## 2017-11-18 ENCOUNTER — Ambulatory Visit (INDEPENDENT_AMBULATORY_CARE_PROVIDER_SITE_OTHER): Payer: Medicare Other | Admitting: Pharmacist Clinician (PhC)/ Clinical Pharmacy Specialist

## 2017-11-18 DIAGNOSIS — Z7901 Long term (current) use of anticoagulants: Secondary | ICD-10-CM

## 2017-11-18 DIAGNOSIS — I480 Paroxysmal atrial fibrillation: Secondary | ICD-10-CM

## 2017-11-18 LAB — POCT INR: INR: 3.5 — AB (ref 2.0–3.0)

## 2017-12-09 ENCOUNTER — Ambulatory Visit (INDEPENDENT_AMBULATORY_CARE_PROVIDER_SITE_OTHER): Payer: Medicare Other | Admitting: Pharmacist Clinician (PhC)/ Clinical Pharmacy Specialist

## 2017-12-09 DIAGNOSIS — I480 Paroxysmal atrial fibrillation: Secondary | ICD-10-CM

## 2017-12-09 DIAGNOSIS — Z7901 Long term (current) use of anticoagulants: Secondary | ICD-10-CM

## 2017-12-09 LAB — CUP PACEART REMOTE DEVICE CHECK
Battery Impedance: 523 Ohm
Brady Statistic AS VS Percent: 0 %
Implantable Lead Implant Date: 20060116
Implantable Lead Location: 753859
Implantable Lead Model: 5076
Lead Channel Impedance Value: 421 Ohm
Lead Channel Impedance Value: 497 Ohm
Lead Channel Pacing Threshold Pulse Width: 0.4 ms
Lead Channel Setting Pacing Amplitude: 2 V
Lead Channel Setting Sensing Sensitivity: 4 mV
MDC IDC LEAD IMPLANT DT: 20060109
MDC IDC LEAD LOCATION: 753858
MDC IDC MSMT BATTERY REMAINING LONGEVITY: 77 mo
MDC IDC MSMT BATTERY VOLTAGE: 2.79 V
MDC IDC MSMT LEADCHNL RA PACING THRESHOLD AMPLITUDE: 0.875 V
MDC IDC MSMT LEADCHNL RA PACING THRESHOLD PULSEWIDTH: 0.4 ms
MDC IDC MSMT LEADCHNL RA SENSING INTR AMPL: 2.8 mV
MDC IDC MSMT LEADCHNL RV PACING THRESHOLD AMPLITUDE: 1.125 V
MDC IDC PG IMPLANT DT: 20140609
MDC IDC SESS DTM: 20191021120722
MDC IDC SET LEADCHNL RV PACING AMPLITUDE: 2.5 V
MDC IDC SET LEADCHNL RV PACING PULSEWIDTH: 0.4 ms
MDC IDC STAT BRADY AP VP PERCENT: 27 %
MDC IDC STAT BRADY AP VS PERCENT: 0 %
MDC IDC STAT BRADY AS VP PERCENT: 73 %

## 2017-12-09 LAB — POCT INR: INR: 3.8 — AB (ref 2.0–3.0)

## 2017-12-30 ENCOUNTER — Ambulatory Visit (INDEPENDENT_AMBULATORY_CARE_PROVIDER_SITE_OTHER): Payer: Medicare Other | Admitting: Pharmacist Clinician (PhC)/ Clinical Pharmacy Specialist

## 2017-12-30 DIAGNOSIS — I480 Paroxysmal atrial fibrillation: Secondary | ICD-10-CM

## 2017-12-30 DIAGNOSIS — Z7901 Long term (current) use of anticoagulants: Secondary | ICD-10-CM | POA: Diagnosis not present

## 2017-12-30 LAB — POCT INR: INR: 2.8 (ref 2.0–3.0)

## 2018-01-20 ENCOUNTER — Other Ambulatory Visit: Payer: Self-pay | Admitting: Cardiovascular Disease

## 2018-01-27 ENCOUNTER — Ambulatory Visit (INDEPENDENT_AMBULATORY_CARE_PROVIDER_SITE_OTHER): Payer: Medicare Other | Admitting: Pharmacist Clinician (PhC)/ Clinical Pharmacy Specialist

## 2018-01-27 DIAGNOSIS — Z7901 Long term (current) use of anticoagulants: Secondary | ICD-10-CM | POA: Diagnosis not present

## 2018-01-27 DIAGNOSIS — I480 Paroxysmal atrial fibrillation: Secondary | ICD-10-CM

## 2018-01-27 LAB — POCT INR: INR: 1.9 — AB (ref 2.0–3.0)

## 2018-01-27 NOTE — Patient Instructions (Signed)
Description   Take extra 1/2 tablet today Tuesday Dec 31 (planning to eat greens for New Year's Day), then continue with 1 tablet daily except 1.5 tablets each Sunday, Tuesday and Thursday. Repeat INR in 4 weeks.

## 2018-02-07 ENCOUNTER — Other Ambulatory Visit: Payer: Self-pay | Admitting: Cardiovascular Disease

## 2018-02-16 ENCOUNTER — Ambulatory Visit (INDEPENDENT_AMBULATORY_CARE_PROVIDER_SITE_OTHER): Payer: Medicare Other

## 2018-02-16 DIAGNOSIS — R001 Bradycardia, unspecified: Secondary | ICD-10-CM

## 2018-02-17 LAB — CUP PACEART REMOTE DEVICE CHECK
Battery Impedance: 573 Ohm
Battery Remaining Longevity: 74 mo
Battery Voltage: 2.79 V
Brady Statistic AP VP Percent: 27 %
Brady Statistic AP VS Percent: 0 %
Brady Statistic AS VP Percent: 73 %
Brady Statistic AS VS Percent: 0 %
Date Time Interrogation Session: 20200120141423
Implantable Lead Implant Date: 20060109
Implantable Lead Implant Date: 20060116
Implantable Lead Location: 753858
Implantable Lead Location: 753859
Implantable Lead Model: 5071
Implantable Lead Model: 5076
Implantable Pulse Generator Implant Date: 20140609
Lead Channel Impedance Value: 426 Ohm
Lead Channel Impedance Value: 485 Ohm
Lead Channel Pacing Threshold Amplitude: 1 V
Lead Channel Pacing Threshold Amplitude: 1.125 V
Lead Channel Pacing Threshold Pulse Width: 0.4 ms
Lead Channel Pacing Threshold Pulse Width: 0.4 ms
Lead Channel Setting Pacing Amplitude: 2 V
Lead Channel Setting Pacing Amplitude: 2.5 V
Lead Channel Setting Pacing Pulse Width: 0.4 ms
Lead Channel Setting Sensing Sensitivity: 4 mV

## 2018-02-17 NOTE — Progress Notes (Signed)
Remote pacemaker transmission.   

## 2018-02-25 ENCOUNTER — Ambulatory Visit (INDEPENDENT_AMBULATORY_CARE_PROVIDER_SITE_OTHER): Payer: Medicare Other | Admitting: Pharmacist

## 2018-02-25 DIAGNOSIS — Z7901 Long term (current) use of anticoagulants: Secondary | ICD-10-CM

## 2018-02-25 DIAGNOSIS — I480 Paroxysmal atrial fibrillation: Secondary | ICD-10-CM

## 2018-02-25 LAB — POCT INR: INR: 2 (ref 2.0–3.0)

## 2018-03-09 ENCOUNTER — Other Ambulatory Visit: Payer: Self-pay | Admitting: Cardiovascular Disease

## 2018-03-13 ENCOUNTER — Ambulatory Visit
Admission: RE | Admit: 2018-03-13 | Discharge: 2018-03-13 | Disposition: A | Discharge status: Home / Self Care | Payer: Medicare Other | Source: Ambulatory Visit | Attending: Family | Admitting: Family

## 2018-03-13 ENCOUNTER — Other Ambulatory Visit: Payer: Self-pay | Admitting: Family

## 2018-03-13 DIAGNOSIS — M25561 Pain in right knee: Secondary | ICD-10-CM

## 2018-03-25 ENCOUNTER — Ambulatory Visit (INDEPENDENT_AMBULATORY_CARE_PROVIDER_SITE_OTHER): Payer: Medicare Other | Admitting: Pharmacist Clinician (PhC)/ Clinical Pharmacy Specialist

## 2018-03-25 DIAGNOSIS — Z7901 Long term (current) use of anticoagulants: Secondary | ICD-10-CM

## 2018-03-25 DIAGNOSIS — I480 Paroxysmal atrial fibrillation: Secondary | ICD-10-CM

## 2018-03-25 LAB — POCT INR: INR: 2.8 (ref 2.0–3.0)

## 2018-03-30 ENCOUNTER — Other Ambulatory Visit: Payer: Self-pay

## 2018-03-30 MED ORDER — SOTALOL HCL 80 MG PO TABS: 80.0000 mg | ORAL_TABLET | Freq: Two times a day (BID) | ORAL | 0 refills | Status: DC

## 2018-03-30 NOTE — Telephone Encounter (Signed)
Rx(s) sent to pharmacy electronically.  

## 2018-04-22 ENCOUNTER — Other Ambulatory Visit: Payer: Self-pay

## 2018-04-22 MED ORDER — FOLIC ACID 1 MG PO TABS: ORAL_TABLET | ORAL | 0 refills | Status: DC

## 2018-05-04 ENCOUNTER — Other Ambulatory Visit: Payer: Self-pay | Admitting: Cardiovascular Disease

## 2018-05-04 NOTE — Telephone Encounter (Signed)
Maxide refilled. 

## 2018-05-05 ENCOUNTER — Telehealth: Payer: Self-pay

## 2018-05-05 NOTE — Telephone Encounter (Signed)
lmom for prescreen/drive up 

## 2018-05-06 ENCOUNTER — Other Ambulatory Visit: Payer: Self-pay

## 2018-05-06 ENCOUNTER — Ambulatory Visit (INDEPENDENT_AMBULATORY_CARE_PROVIDER_SITE_OTHER): Payer: Medicare Other | Admitting: Pharmacist Clinician (PhC)/ Clinical Pharmacy Specialist

## 2018-05-06 DIAGNOSIS — Z7901 Long term (current) use of anticoagulants: Secondary | ICD-10-CM

## 2018-05-06 DIAGNOSIS — I480 Paroxysmal atrial fibrillation: Secondary | ICD-10-CM

## 2018-05-06 LAB — POCT INR: INR: 2.5 (ref 2.0–3.0)

## 2018-05-16 ENCOUNTER — Other Ambulatory Visit: Payer: Self-pay | Admitting: Cardiovascular Disease

## 2018-05-18 ENCOUNTER — Ambulatory Visit (INDEPENDENT_AMBULATORY_CARE_PROVIDER_SITE_OTHER): Payer: Medicare Other | Admitting: *Deleted

## 2018-05-18 ENCOUNTER — Other Ambulatory Visit: Payer: Self-pay

## 2018-05-18 DIAGNOSIS — I442 Atrioventricular block, complete: Secondary | ICD-10-CM | POA: Diagnosis not present

## 2018-05-18 LAB — CUP PACEART REMOTE DEVICE CHECK
Battery Impedance: 623 Ohm
Battery Remaining Longevity: 71 mo
Battery Voltage: 2.79 V
Brady Statistic AP VP Percent: 26 %
Brady Statistic AP VS Percent: 0 %
Brady Statistic AS VP Percent: 74 %
Brady Statistic AS VS Percent: 0 %
Date Time Interrogation Session: 20200420131139
Implantable Lead Implant Date: 20060109
Implantable Lead Implant Date: 20060116
Implantable Lead Location: 753858
Implantable Lead Location: 753859
Implantable Lead Model: 5071
Implantable Lead Model: 5076
Implantable Pulse Generator Implant Date: 20140609
Lead Channel Impedance Value: 432 Ohm
Lead Channel Impedance Value: 483 Ohm
Lead Channel Pacing Threshold Amplitude: 1 V
Lead Channel Pacing Threshold Amplitude: 1.25 V
Lead Channel Pacing Threshold Pulse Width: 0.4 ms
Lead Channel Pacing Threshold Pulse Width: 0.4 ms
Lead Channel Setting Pacing Amplitude: 2 V
Lead Channel Setting Pacing Amplitude: 2.5 V
Lead Channel Setting Pacing Pulse Width: 0.4 ms
Lead Channel Setting Sensing Sensitivity: 4 mV

## 2018-05-22 ENCOUNTER — Telehealth: Payer: Self-pay

## 2018-05-22 DIAGNOSIS — I5042 Chronic combined systolic (congestive) and diastolic (congestive) heart failure: Secondary | ICD-10-CM

## 2018-05-22 DIAGNOSIS — E7800 Pure hypercholesterolemia, unspecified: Secondary | ICD-10-CM

## 2018-05-22 DIAGNOSIS — I480 Paroxysmal atrial fibrillation: Secondary | ICD-10-CM

## 2018-05-22 DIAGNOSIS — I442 Atrioventricular block, complete: Secondary | ICD-10-CM

## 2018-05-22 NOTE — Telephone Encounter (Signed)
Spoke to patient Dr.Croitoru reviewed PM remote check.He advised battery status good.Advised if no lab in the last 6 months, he advised to have bmet,magnesium,lipid panel,tsh.Patient will have done at our office next week.

## 2018-05-25 ENCOUNTER — Encounter: Payer: Self-pay | Admitting: Cardiology

## 2018-05-25 LAB — TSH: TSH: 5.56 u[IU]/mL — ABNORMAL HIGH (ref 0.450–4.500)

## 2018-05-25 LAB — BASIC METABOLIC PANEL
BUN/Creatinine Ratio: 22 (ref 12–28)
BUN: 19 mg/dL (ref 8–27)
CO2: 26 mmol/L (ref 20–29)
Calcium: 9.5 mg/dL (ref 8.7–10.3)
Chloride: 103 mmol/L (ref 96–106)
Creatinine, Ser: 0.86 mg/dL (ref 0.57–1.00)
GFR calc Af Amer: 79 mL/min/{1.73_m2} (ref >59)
GFR calc non Af Amer: 68 mL/min/{1.73_m2} (ref >59)
Glucose: 85 mg/dL (ref 65–99)
Potassium: 4.6 mmol/L (ref 3.5–5.2)
Sodium: 142 mmol/L (ref 134–144)

## 2018-05-25 LAB — LIPID PANEL
Chol/HDL Ratio: 4 ratio (ref 0.0–4.4)
Cholesterol, Total: 183 mg/dL (ref 100–199)
HDL: 46 mg/dL (ref >39)
LDL Calculated: 110 mg/dL — ABNORMAL HIGH (ref 0–99)
Triglycerides: 133 mg/dL (ref 0–149)
VLDL Cholesterol Cal: 27 mg/dL (ref 5–40)

## 2018-05-25 NOTE — Progress Notes (Signed)
Remote pacemaker transmission.   

## 2018-05-26 LAB — MAGNESIUM: Magnesium: 2.2 mg/dL (ref 1.6–2.3)

## 2018-06-08 ENCOUNTER — Other Ambulatory Visit: Payer: Self-pay

## 2018-06-08 MED ORDER — RAMIPRIL 10 MG PO CAPS: 10.0000 mg | ORAL_CAPSULE | Freq: Every day | ORAL | 1 refills | Status: DC

## 2018-06-15 ENCOUNTER — Other Ambulatory Visit: Payer: Self-pay

## 2018-06-15 MED ORDER — SOTALOL HCL 80 MG PO TABS: 80.0000 mg | ORAL_TABLET | Freq: Two times a day (BID) | ORAL | 0 refills | Status: DC

## 2018-06-15 NOTE — Telephone Encounter (Signed)
Rx(s) sent to pharmacy electronically.  

## 2018-06-29 ENCOUNTER — Telehealth: Payer: Self-pay

## 2018-06-29 NOTE — Telephone Encounter (Signed)
lmom for prescreen  

## 2018-07-01 ENCOUNTER — Ambulatory Visit (INDEPENDENT_AMBULATORY_CARE_PROVIDER_SITE_OTHER): Payer: Medicare Other | Admitting: Pharmacist

## 2018-07-01 ENCOUNTER — Other Ambulatory Visit: Payer: Self-pay

## 2018-07-01 DIAGNOSIS — I480 Paroxysmal atrial fibrillation: Secondary | ICD-10-CM

## 2018-07-01 DIAGNOSIS — Z7901 Long term (current) use of anticoagulants: Secondary | ICD-10-CM

## 2018-07-01 LAB — POCT INR: INR: 3.1 — AB (ref 2.0–3.0)

## 2018-07-06 ENCOUNTER — Other Ambulatory Visit: Payer: Self-pay

## 2018-07-06 MED ORDER — FOLIC ACID 1 MG PO TABS: 1.0000 mg | ORAL_TABLET | Freq: Every day | ORAL | 0 refills | Status: DC

## 2018-07-06 NOTE — Telephone Encounter (Signed)
Rx(s) sent to pharmacy electronically.  

## 2018-07-25 ENCOUNTER — Telehealth: Payer: Medicare Other | Admitting: Family

## 2018-07-25 DIAGNOSIS — Z20822 Contact with and (suspected) exposure to covid-19: Secondary | ICD-10-CM

## 2018-07-25 NOTE — Progress Notes (Signed)
E-Visit for Corona Virus Screening   Based on your current symptoms, it seems unlikely that your symptoms are related to the Coronavirus.  and Your current symptoms could be consistent with the coronavirus.  Call your health care provider or local health department to request and arrange formal testing. Many health care providers can now test patients at their office but not all are.  Please quarantine yourself while awaiting your test results.  [place] [phone], [place], [place] or visit [email or web address]     COVID-19 is a respiratory illness with symptoms that are similar to the flu. Symptoms are typically mild to moderate, but there have been cases of severe illness and death due to the virus. The following symptoms may appear 2-14 days after exposure: . Fever . Cough . Shortness of breath or difficulty breathing . Chills . Repeated shaking with chills . Muscle pain . Headache . Sore throat . New loss of taste or smell . Fatigue . Congestion or runny nose . Nausea or vomiting . Diarrhea  It is vitally important that if you feel that you have an infection such as this virus or any other virus that you stay home and away from places where you may spread it to others.  You should self-quarantine for 14 days if you have symptoms that could potentially be coronavirus or have been in close contact a with a person diagnosed with COVID-19 within the last 2 weeks. You should avoid contact with people age 72 and older.   You should wear a mask or cloth face covering over your nose and mouth if you must be around other people or animals, including pets (even at home). Try to stay at least 6 feet away from other people. This will protect the people around you.  You may also take acetaminophen (Tylenol) as needed for fever.   Reduce  your risk of any infection by using the same precautions used for avoiding the common cold or flu:  Marland Kitchen Wash your hands often with soap and warm water for at least 20 seconds.  If soap and water are not readily available, use an alcohol-based hand sanitizer with at least 60% alcohol.  . If coughing or sneezing, cover your mouth and nose by coughing or sneezing into the elbow areas of your shirt or coat, into a tissue or into your sleeve (not your hands). . Avoid shaking hands with others and consider head nods or verbal greetings only. . Avoid touching your eyes, nose, or mouth with unwashed hands.  . Avoid close contact with people who are sick. . Avoid places or events with large numbers of people in one location, like concerts or sporting events. . Carefully consider travel plans you have or are making. . If you are planning any travel outside or inside the [place], visit the CDC's Travelers' Health webpage for the latest health notices. . If you have some symptoms but not all symptoms, continue to monitor at home and seek medical attention if your symptoms worsen. . If you are having a medical emergency, call 911.  HOME CARE . Only take medications as instructed by your medical team. . Drink plenty of fluids and get plenty of rest. . A steam or ultrasonic humidifier can help if you have congestion.   GET HELP RIGHT AWAY IF YOU HAVE EMERGENCY WARNING SIGNS** FOR COVID-19. If you or someone is showing any of these signs seek emergency medical care immediately. Call  911 or proceed to your closest emergency facility if: . You develop worsening high fever. . Trouble breathing . Bluish lips or face . Persistent pain or pressure in the chest . New confusion . Inability to wake or stay awake . You cough up blood. . Your symptoms become more severe  **This list is not all possible symptoms. Contact your medical provider for any symptoms that are sever or concerning to you.   MAKE SURE YOU    Understand these instructions.  Will watch your condition.  Will get help right away if you are not doing well or get worse.  Your e-visit answers were reviewed by a board certified advanced clinical practitioner to complete your personal care plan.  Depending on the condition, your plan could have included both over the counter or prescription medications.  If there is a problem please reply once you have received a response from your provider.  Your safety is important to [place].  If you have drug allergies check your prescription carefully.    You can use MyChart to ask questions about today's visit, request a non-urgent call back, or ask for a work or school excuse for 24 hours related to this e-Visit. If it has been greater than 24 hours you will need to follow up with your provider, or enter a new e-Visit to address those concerns. You will get an e-mail in the next two days asking about your experience.  I hope that your e-visit has been valuable and will speed your recovery. Thank you for using e-visits.   Greater than 5 minutes, yet less than 10 minutes of time have been spent researching, coordinating, and implementing care for this patient today.  Thank you for the details you included in the comment boxes. Those details are very helpful in determining the best course of treatment for you and help [place] to provide the best care.

## 2018-07-28 ENCOUNTER — Telehealth: Payer: Self-pay | Admitting: Cardiovascular Disease

## 2018-07-28 NOTE — Telephone Encounter (Signed)
Returned the call to the patient. She has been advised that Dr. Sallyanne Kuster sent back a message to her patient advice request. She has also been advised that she should contact her PCP.  She has been doing well with only mild symptoms. She has been advised to call if anything is needed.    I am sorry to hear that, [name]. Most people with COVID-19 infections have mild illness. As I am sure you know, lung problems are the most common serious complication. As with other respiratory illnesses, an increase in the burden of atrial fibrillation is likely to occur, but there is no need to adjust any of your cardiac medications at this time. Please follow your primary care provider's recommendation. Please call for urgent attention (dial 911) if the following develop:   You develop worsening high fever.  Trouble breathing  Bluish lips or face  Persistent pain or pressure in the chest  New confusion  Inability to wake or stay awake  You cough up blood.  Your symptoms become more severe.   Hope you feel better soon,  Dr. Loletha Grayer

## 2018-07-28 NOTE — Telephone Encounter (Signed)
New Message   Patient is calling to advise that she did get the covid-19 test and tested positive. She states that she was advised to contact her PCP but she is not able to get someone on the phone. Therefore she is notifying the cardiologist team to see her next steps.

## 2018-08-17 ENCOUNTER — Telehealth: Payer: Self-pay

## 2018-08-17 ENCOUNTER — Ambulatory Visit (INDEPENDENT_AMBULATORY_CARE_PROVIDER_SITE_OTHER): Payer: Medicare Other | Admitting: *Deleted

## 2018-08-17 DIAGNOSIS — I442 Atrioventricular block, complete: Secondary | ICD-10-CM

## 2018-08-17 LAB — CUP PACEART REMOTE DEVICE CHECK
Battery Impedance: 697 Ohm
Battery Remaining Longevity: 67 mo
Battery Voltage: 2.78 V
Brady Statistic AP VP Percent: 25 %
Brady Statistic AP VS Percent: 0 %
Brady Statistic AS VP Percent: 75 %
Brady Statistic AS VS Percent: 0 %
Date Time Interrogation Session: 20200720113847
Implantable Lead Implant Date: 20060109
Implantable Lead Implant Date: 20060116
Implantable Lead Location: 753858
Implantable Lead Location: 753859
Implantable Lead Model: 5071
Implantable Lead Model: 5076
Implantable Pulse Generator Implant Date: 20140609
Lead Channel Impedance Value: 426 Ohm
Lead Channel Impedance Value: 480 Ohm
Lead Channel Pacing Threshold Amplitude: 1 V
Lead Channel Pacing Threshold Amplitude: 1.125 V
Lead Channel Pacing Threshold Pulse Width: 0.4 ms
Lead Channel Pacing Threshold Pulse Width: 0.4 ms
Lead Channel Setting Pacing Amplitude: 2 V
Lead Channel Setting Pacing Amplitude: 2.5 V
Lead Channel Setting Pacing Pulse Width: 0.4 ms
Lead Channel Setting Sensing Sensitivity: 4 mV

## 2018-08-17 NOTE — Telephone Encounter (Signed)

## 2018-08-19 ENCOUNTER — Ambulatory Visit (INDEPENDENT_AMBULATORY_CARE_PROVIDER_SITE_OTHER): Payer: Medicare Other | Admitting: Pharmacist Clinician (PhC)/ Clinical Pharmacy Specialist

## 2018-08-19 ENCOUNTER — Other Ambulatory Visit: Payer: Self-pay

## 2018-08-19 DIAGNOSIS — I480 Paroxysmal atrial fibrillation: Secondary | ICD-10-CM

## 2018-08-19 DIAGNOSIS — Z7901 Long term (current) use of anticoagulants: Secondary | ICD-10-CM | POA: Diagnosis not present

## 2018-08-19 LAB — POCT INR: INR: 2.5 (ref 2.0–3.0)

## 2018-08-31 ENCOUNTER — Ambulatory Visit: Payer: Medicare Other | Admitting: Cardiovascular Disease

## 2018-08-31 ENCOUNTER — Encounter: Payer: Self-pay | Admitting: Cardiology

## 2018-08-31 NOTE — Progress Notes (Deleted)
Patient ID: [name], female   DOB: Jan 09, 1946, 73 y.o.   MRN: [id number]    Cardiology Office Note    Date:  08/31/2018   ID:  [name], DOB 1945/07/19, MRN [id number]  PCP:  Michelle Broad, FNP  Cardiologist:   Michelle Klein, MD   No chief complaint on file.   History of Present Illness:  [name] is a 73 y.o. female status post mitral valve annuloplasty, with sinus node dysfunction, high-grade AV block, paroxysmal atrial fibrillation on warfarin, here for clinical and pacemaker follow-up.  Overall she is feeling quite well and does not feel at all restricted in daily activities.  She has chronic mild exertional dyspnea (functional class II.  The patient specifically denies any chest pain at rest exertion, dyspnea at rest, orthopnea, paroxysmal nocturnal dyspnea, syncope, palpitations, focal neurological deficits, intermittent claudication, lower extremity edema, unexplained weight gain, cough, hemoptysis or wheezing.  Pacemaker interrogation shows normal device function, estimated generator longevity unchanged since her last appointment at 7.5 years, normal lead parameters, 0.3 % atrial pacing and 99.8 % ventricular pacing. She has had frequent but mostly brief episodes of atrial mode switch with an overall burden of 5%.  Clear atrial fibrillation lasting for several hours was documented in February and May 2018, but she also has longer episodes of sustained atrial tachycardia with a rate of around 150 bpm. There have been no meaningful episodes of ventricular tachycardia  There is no underlying escape rhythm when pacing this taken down to 30 bpm.  She is pacemaker dependent.  Echo September 2017 shows mildly depressed left ventricular systolic function with EF of 45-50%, at least in part secondary to pacing induced systolic dyssynchrony. There was echo evidence of elevated filling pressures due to grade 2 diastolic dysfunction. The left atrium is mildly dilated  with end systolic diameter of 41 mm. there was no evidence of mitral insufficiency and her annuloplasty was not associated with significantly increased mitral gradients.  [name] had mitral valve annuloplasty for severe mitral insufficiency in 2006.  The left atrial appendage was ligated during that procedure.  She did not have coronary disease by preoperative angiography and has normal left ventricular systolic function. Followup echocardiography 2015 and 2017 shows borderline low left ventricular ejection fraction (45-50%). Valve repair looks good. She had a dual chamber permanent pacemaker (Medtronic) implanted in 2006 and has complete heart block . The right atrial lead is a conventional transvenous lead. The ventricular lead is an active fixation epicardial lead on the surface of the left ventricle placed at the time of her surgery. A generator change was performed in 2014. Her pacemaker is located in a deep subpectoral pocket. She has paroxysmal atrial fibrillation. She has treated hypertension, dyslipidemia and type 2 diabetes mellitus on the background of severe obesity.   Past Medical History:  Diagnosis Date   Arthritis    Atrial fibrillation [facility])    PAF---currently anticoagulated with Warfarin   Atrial flutter ([place]) 02/21/2004   s/p mitral valve repair and ppm insertion   Congenital heart block 08/26/2003   Diagnosed by [name]. A temp orary pacemaker was placed in 1988 during hysterectomy procedure.   Diabetes mellitus without complication ([place])    prediabetic   Diverticulosis    Dyslipidemia    takes Niacin and Crestor daily   History of blood transfusion    no abnormal reaction noted   Hypothyroidism    takes Synthroid daily   Mitral valve insufficiency  Recieved Mitral valve repair with reconstruction of the anterior leaflet cordis with cortex and ring annuloplasty   Nonischemic cardiomyopathy ([place]) 2006   EF of 35% due to AV dyssyncrony. Has since  resolved per most recent ECHO.   RBBB    Due the VP with the LV epicardial lead that was inserted in 2006 during the mitral repair   Systemic hypertension    takes  Ramipril daily-Maxzide every other day   Urinary urgency     Past Surgical History:  Procedure Laterality Date   ABDOMINAL HYSTERECTOMY     BREAST LUMPECTOMY WITH RADIOACTIVE SEED LOCALIZATION Right 11/22/2014   Procedure: RIGHT BREAST LUMPECTOMY WITH RADIOACTIVE SEED LOCALIZATION;  Surgeon: Michelle Skates, MD;  Location: [facility];  Service: General;  Laterality: Right;   CARDIAC CATHETERIZATION  10/20/2003   Minimal decrease in the LV systolic EF of 00-93% with global hyperkinesis. Moderately dilated LV. 3+ Mitral regurgitation. Underfilling of LAD which promptly improved with intracoronary NTG administration. Microvascular angina or microvascular spasm. Otherwise coronaries were normal. No intervention necessary.   CARDIOVASCULAR STRESS TEST  09/14/2003   Performed for the pt's congenital CHB that was discovered during a preoperative evaluation for a colonoscopy.  EKG demonstrated CHB with junctional escape. Nonspecific ST changes noted. Stress ECG was positive for ischemia(2 mm ST depression). Dilated LV cavity. Inability to calculate systolic function. High risk scan. This scan was followed up with a cardiac cath.   COLONOSCOPY     MITRAL VALVE ANNULOPLASTY  02/06/2004   Performed for severe MR requiring mitral valve repair with anterior leaflet reconstruction and a [place] annuloplasty ring--model: [id number], and an 80% lesion reduction ([name]). Two LV leads were placed epicardially just in the event that biv pacing would be needed. (One of the leads had been used for during the ppm insertion recieved one week later)    PERMANENT PACEMAKER GENERATOR CHANGE  07/06/2012   Medtronic   PERMANENT PACEMAKER GENERATOR CHANGE N/A 07/06/2012   Procedure: PERMANENT PACEMAKER GENERATOR CHANGE;  Surgeon:  Michelle Klein, MD;  Location: [facility];  Service: Cardiovascular;  Laterality: N/A;   PERMANENT PACEMAKER INSERTION  02/13/2004   PPM implant following a mitral valve annuloplasty. An epicardial active fixation lead (implanted on 02/06/2004 during the mitral valve procedure) was used for this implant.    US ECHOCARDIOGRAPHY  09/10/2010 and1/07/2008   The last echo showed an EF of >55%; RV mildly dilated. Mild to moderately dilated LA  with borderline RA dilation as well. Impaired diastolic function. Normal RV systolic function.MVR without residual insufficiency.No Pericardial effusion.    Current Medications: Outpatient Medications Prior to Visit  Medication Sig Dispense Refill   acetaminophen (TYLENOL) 325 MG tablet Take 650 mg by mouth as needed for pain.     cholecalciferol (VITAMIN D) 400 UNITS TABS Take 400 Units by mouth daily.     folic acid (FOLVITE) 1 MG tablet Take 1 tablet (1 mg total) by mouth daily. MUST KEEP APPOINTMENT 08/31/18 WITH DR Maurilio Puryear FOR FUTURE REFILLS 90 tablet 0   HYDROcodone-acetaminophen (NORCO) 5-325 MG tablet Take 1-2 tablets by mouth every 6 (six) hours as needed for moderate pain or severe pain. 30 tablet 0   levothyroxine (SYNTHROID, LEVOTHROID) 75 MCG tablet Take 75 mcg by mouth daily before breakfast.     niacin 500 MG tablet Take 500 mg by mouth every evening.      Omega-3 Fatty Acids (OMEGA 3 PO) Take 1,000 mg by mouth daily.  ramipril (ALTACE) 10 MG capsule Take 1 capsule (10 mg total) by mouth at bedtime. 90 capsule 1   rosuvastatin (CRESTOR) 10 MG tablet Take 1 tablet (10 mg total) by mouth daily. 28 tablet 0   sotalol (BETAPACE) 80 MG tablet Take 1 tablet (80 mg total) by mouth 2 (two) times daily. MUST KEEP APPOINTMENT 08/31/18 WITH DR Lonnette Shrode FOR FUTURE REFILLS 180 tablet 0   triamterene-hydrochlorothiazide (MAXZIDE-25) 37.5-25 MG tablet TAKE 1/2 TABLET BY MOUTH EVERY DAY AS DIRECTED 15 tablet 9   warfarin (COUMADIN) 5 MG tablet TAKE 1  TO 1 AND 1/2 TABLETS BY MOUTH EVERY DAY AS DIRECTED BY COUMADIN CLINIC 135 tablet 1   warfarin (COUMADIN) 5 MG tablet TAKE 1 TO 1 AND 1/2 TABLETS BY MOUTH EVERY DAY AS DIRECTED BY COUMADIN CLINIC 135 tablet 1   No facility-administered medications prior to visit.      Allergies:   Patient has no known allergies.   Social History   Socioeconomic History   Marital status: Single    Spouse name: Not on file   Number of children: Not on file   Years of education: Not on file   Highest education level: Not on file  Occupational History   Not on file  Social Needs   Financial resource strain: Not on file   Food insecurity    Worry: Not on file    Inability: Not on file   Transportation needs    Medical: Not on file    Non-medical: Not on file  Tobacco Use   Smoking status: Former Smoker    Types: Cigarettes   Smokeless tobacco: Never Used   Tobacco comment: quit smoking in 2006  Substance and Sexual Activity   Alcohol use: Yes    Comment: wine occasionally   Drug use: No   Sexual activity: Yes    Birth control/protection: Surgical  Lifestyle   Physical activity    Days per week: Not on file    Minutes per session: Not on file   Stress: Not on file  Relationships   Social connections    Talks on phone: Not on file    Gets together: Not on file    Attends religious service: Not on file    Active member of club or organization: Not on file    Attends meetings of clubs or organizations: Not on file    Relationship status: Not on file  Other Topics Concern   Not on file  Social History Narrative   Not on file     Family History:  The patient's family history includes Asthma in her brother and sister; Cancer - Other in her mother; Healthy in her brother, brother, brother, and brother; Pneumonia in her father.   ROS:   Please see the history of present illness.    ROS tachycardic or ischemic symptoms all other systems reviewed and are  negative.   PHYSICAL EXAM:   VS:  There were no vitals taken for this visit.    General: Alert, oriented x3, no distress, moderately obese Head: no evidence of trauma, PERRL, EOMI, no exophtalmos or lid lag, no myxedema, no xanthelasma; normal ears, nose and oropharynx Neck: normal jugular venous pulsations and no hepatojugular reflux; brisk carotid pulses without delay and no carotid bruits Chest: clear to auscultation, no signs of consolidation by percussion or palpation, normal fremitus, symmetrical and full respiratory excursions.  Healthy left subclavian pacemaker site Cardiovascular: normal position and quality of the apical impulse, regular rhythm,  normal first and paradoxically split second heart sounds, no murmurs, rubs or gallops Abdomen: no tenderness or distention, no masses by palpation, no abnormal pulsatility or arterial bruits, normal bowel sounds, no hepatosplenomegaly Extremities: no clubbing, cyanosis or edema; 2+ radial, ulnar and brachial pulses bilaterally; 2+ right femoral, posterior tibial and dorsalis pedis pulses; 2+ left femoral, posterior tibial and dorsalis pedis pulses; no subclavian or femoral bruits Neurological: grossly nonfocal Psych: Normal mood and affect   Wt Readings from Last 3 Encounters:  02/19/17 207 lb 3.2 oz (94 kg)  05/08/16 212 lb (96.2 kg)  10/31/15 213 lb 12.8 oz (97 kg)      Studies/Labs Reviewed:   EKG:  EKG is  ordered today.  The ekg ordered today demonstrates atrial paced, ventricular paced rhythm.  She has prominent positive R waves in the septal leads consistent with the epicardial pacing.  QTc 561 ms (557- 583 ms throughout the last year)  Recent Labs: 05/25/2018: BUN 19; Creatinine, Ser 0.86; Magnesium 2.2; Potassium 4.6; Sodium 142; TSH 5.560     ASSESSMENT:    No diagnosis found.   PLAN:  In order of problems listed above:  1. Afib: Her overall burden of atrial arrhythmias around 5%, although more commonly she has a  regular atrial tachycardia around 150-160 bpm, which I suspect is an atriotomy scar left atrial flutter.  Atrial fibrillation is less frequent.  She is appropriately anticoagulated with warfarin. 2. CHF: EF 45-50%, some variation in evaluation, but direct image comparison shows little change. Preop EF was 40-45%, so current EF is expected. On ACE inhibitor and "beta blocker" (sotalol).  Appears clinically euvolemic, NYHA functional class II 3. CHB: pacemaker dependent. Her V pacing lead is an epicardial LV lead. Upgrade to CRT is therefore less likely to be of benefit. 4. PM: normal device function.  Remote downloads every 3 months and yearly office visit 5. MR s/p annuloplasty: she has had a durable and good result from her annuloplasty 6. Warfarin: Although her embolic risk would appear to be quite high due to the presence of left ventricular dysfunction valvular heart disease should be noted that she also had surgical closure or her left atrial appendage at the time of mitral surgery and only mildly data left atrium. CHADSVasc at least 77 (age, gender, LV dysfunction/CHF, hypertension) 7. Sotalol: QT is prolonged in large part due to a very Todd paced QRS complex.  Need to check renal function and potassium level at least every 6 months.  No new episodes of nonsustained VT recorded.  Reminded her about the multiple drugs that could potentially interact with sotalol and warfarin.   Medication Adjustments/Labs and Tests Ordered: Current medicines are reviewed at length with the patient today.  Concerns regarding medicines are outlined above.  Medication changes, Labs and Tests ordered today are listed in the Patient Instructions below. There are no Patient Instructions on file for this visit.   Signed, Michelle Klein, MD  08/31/2018 10:53 AM    [facility] [address], [place], [place]  [address] Phone: [phone]; Fax: [phone]

## 2018-08-31 NOTE — Progress Notes (Signed)
Remote pacemaker transmission.   

## 2018-09-11 ENCOUNTER — Other Ambulatory Visit: Payer: Self-pay

## 2018-09-11 MED ORDER — SOTALOL HCL 80 MG PO TABS: 80.0000 mg | ORAL_TABLET | Freq: Two times a day (BID) | ORAL | 0 refills | Status: DC

## 2018-09-30 ENCOUNTER — Other Ambulatory Visit: Payer: Self-pay

## 2018-09-30 MED ORDER — FOLIC ACID 1 MG PO TABS: 1.0000 mg | ORAL_TABLET | Freq: Every day | ORAL | 0 refills | Status: DC

## 2018-10-12 ENCOUNTER — Other Ambulatory Visit: Payer: Self-pay

## 2018-10-12 NOTE — Telephone Encounter (Signed)
Refill

## 2018-10-14 ENCOUNTER — Other Ambulatory Visit: Payer: Self-pay

## 2018-10-14 ENCOUNTER — Ambulatory Visit (INDEPENDENT_AMBULATORY_CARE_PROVIDER_SITE_OTHER): Payer: Medicare Other | Admitting: Pharmacist

## 2018-10-14 DIAGNOSIS — I480 Paroxysmal atrial fibrillation: Secondary | ICD-10-CM

## 2018-10-14 DIAGNOSIS — Z7901 Long term (current) use of anticoagulants: Secondary | ICD-10-CM | POA: Diagnosis not present

## 2018-10-14 LAB — POCT INR: INR: 2.5 (ref 2.0–3.0)

## 2018-11-06 ENCOUNTER — Other Ambulatory Visit: Payer: Self-pay

## 2018-11-16 ENCOUNTER — Ambulatory Visit (INDEPENDENT_AMBULATORY_CARE_PROVIDER_SITE_OTHER): Payer: Medicare Other | Admitting: *Deleted

## 2018-11-16 DIAGNOSIS — I480 Paroxysmal atrial fibrillation: Secondary | ICD-10-CM

## 2018-11-16 DIAGNOSIS — I442 Atrioventricular block, complete: Secondary | ICD-10-CM

## 2018-11-16 LAB — CUP PACEART REMOTE DEVICE CHECK
Battery Impedance: 750 Ohm
Battery Remaining Longevity: 65 mo
Battery Voltage: 2.79 V
Brady Statistic AP VP Percent: 25 %
Brady Statistic AP VS Percent: 0 %
Brady Statistic AS VP Percent: 74 %
Brady Statistic AS VS Percent: 0 %
Date Time Interrogation Session: 20201019120452
Implantable Lead Implant Date: 20060109
Implantable Lead Implant Date: 20060116
Implantable Lead Location: 753858
Implantable Lead Location: 753859
Implantable Lead Model: 5071
Implantable Lead Model: 5076
Implantable Pulse Generator Implant Date: 20140609
Lead Channel Impedance Value: 415 Ohm
Lead Channel Impedance Value: 497 Ohm
Lead Channel Pacing Threshold Amplitude: 0.875 V
Lead Channel Pacing Threshold Amplitude: 1 V
Lead Channel Pacing Threshold Pulse Width: 0.4 ms
Lead Channel Pacing Threshold Pulse Width: 0.4 ms
Lead Channel Setting Pacing Amplitude: 2 V
Lead Channel Setting Pacing Amplitude: 2.5 V
Lead Channel Setting Pacing Pulse Width: 0.4 ms
Lead Channel Setting Sensing Sensitivity: 4 mV

## 2018-11-20 ENCOUNTER — Ambulatory Visit (INDEPENDENT_AMBULATORY_CARE_PROVIDER_SITE_OTHER): Payer: Medicare Other | Admitting: Pharmacist Clinician (PhC)/ Clinical Pharmacy Specialist

## 2018-11-20 ENCOUNTER — Ambulatory Visit (INDEPENDENT_AMBULATORY_CARE_PROVIDER_SITE_OTHER): Payer: Medicare Other | Admitting: Cardiovascular Disease

## 2018-11-20 ENCOUNTER — Other Ambulatory Visit: Payer: Self-pay

## 2018-11-20 ENCOUNTER — Encounter: Payer: Self-pay | Admitting: Cardiovascular Disease

## 2018-11-20 VITALS — BP 121/81 | HR 78 | Temp 97.2°F | Ht 63.0 in | Wt 207.0 lb

## 2018-11-20 DIAGNOSIS — I480 Paroxysmal atrial fibrillation: Secondary | ICD-10-CM

## 2018-11-20 DIAGNOSIS — Z7901 Long term (current) use of anticoagulants: Secondary | ICD-10-CM | POA: Diagnosis not present

## 2018-11-20 DIAGNOSIS — Z9889 Other specified postprocedural states: Secondary | ICD-10-CM

## 2018-11-20 DIAGNOSIS — I5042 Chronic combined systolic (congestive) and diastolic (congestive) heart failure: Secondary | ICD-10-CM | POA: Diagnosis not present

## 2018-11-20 DIAGNOSIS — I442 Atrioventricular block, complete: Secondary | ICD-10-CM

## 2018-11-20 DIAGNOSIS — Z950 Presence of cardiac pacemaker: Secondary | ICD-10-CM

## 2018-11-20 DIAGNOSIS — Z79899 Other long term (current) drug therapy: Secondary | ICD-10-CM

## 2018-11-20 DIAGNOSIS — Z5181 Encounter for therapeutic drug level monitoring: Secondary | ICD-10-CM

## 2018-11-20 LAB — POCT INR: INR: 2.7 (ref 2.0–3.0)

## 2018-11-20 MED ORDER — LEVOTHYROXINE SODIUM 75 MCG PO TABS: 75.0000 ug | ORAL_TABLET | Freq: Every day | ORAL | 3 refills | Status: AC

## 2018-11-20 MED ORDER — FOLIC ACID 1 MG PO TABS: 1.0000 mg | ORAL_TABLET | Freq: Every day | ORAL | 3 refills | Status: DC

## 2018-11-20 MED ORDER — TRIAMTERENE-HCTZ 37.5-25 MG PO TABS: ORAL_TABLET | ORAL | 3 refills | Status: DC

## 2018-11-20 MED ORDER — SOTALOL HCL 80 MG PO TABS: 80.0000 mg | ORAL_TABLET | Freq: Two times a day (BID) | ORAL | 3 refills | Status: DC

## 2018-11-20 MED ORDER — ROSUVASTATIN CALCIUM 10 MG PO TABS: 10.0000 mg | ORAL_TABLET | Freq: Every day | ORAL | 3 refills | Status: DC

## 2018-11-20 MED ORDER — RAMIPRIL 10 MG PO CAPS: 10.0000 mg | ORAL_CAPSULE | Freq: Every day | ORAL | 3 refills | Status: DC

## 2018-11-20 NOTE — Progress Notes (Signed)
Patient ID: [name], female   DOB: 08-27-1945, 73 y.o.   MRN: [id number]    Cardiology Office Note    Date:  11/21/2018   ID:  [name], DOB 06/18/1945, MRN [id number]  PCP:  Suella Broad, FNP  Cardiologist:   Sanda Klein, MD   Chief Complaint  Patient presents with  . Atrial Fibrillation  . Pacemaker Check    History of Present Illness:  [name] is a 73 y.o. female status post mitral valve annuloplasty, with sinus node dysfunction, high-grade AV block, paroxysmal atrial fibrillation on warfarin, here for clinical and pacemaker follow-up.  She reports doing quite well.  She has no cardiac complaints. The patient specifically denies any chest pain at rest or with exertion, dyspnea at rest or with exertion, orthopnea, paroxysmal nocturnal dyspnea, syncope, palpitations, focal neurological deficits, intermittent claudication, lower extremity edema, unexplained weight gain, cough, hemoptysis or wheezing.  She has not had any falls, injuries or bleeding problems.  Pacemaker interrogation shows normal device function, estimated generator longevity unchanged since her last appointment at 5.5years, normal lead parameters, 25 % atrial pacing and 99.9 % ventricular pacing. She has had frequent but mostly brief episodes of atrial mode switch with an overall burden of 6.4%, only slightly more than a year ago.  As before, some episodes are clearly atrial fibrillation lasting for several hours, which is clearly rate controlled.  She also has very frequent and rather lengthy episodes of atrial tachycardia (one of them lasted for 27 hours).  On the average rate control is good.  On presentation today while her EKG was being acquired her rhythm spontaneously changed from paroxysmal atrial tachycardia with demand ventricular pacing at 150 bpm to atrial sensed, ventricular paced rhythm (sinus mechanism).  The QTC is 517 ms (paced QRS over 150 ms).  Ventricular pacing is via an  epicardial lead: she has prominent R waves in lead V1 during ventricular pacing.  There is no underlying escape rhythm when pacing this taken down to 30 bpm.  She is pacemaker dependent.  Lead parameters are excellent.  Echo September 2017 shows mildly depressed left ventricular systolic function with EF of 45-50%, at least in part secondary to pacing induced systolic dyssynchrony. There was echo evidence of elevated filling pressures due to grade 2 diastolic dysfunction. The left atrium is mildly dilated with end systolic diameter of 41 mm. there was no evidence of mitral insufficiency and her annuloplasty was not associated with significantly increased mitral gradients.  [name] had mitral valve annuloplasty for severe mitral insufficiency in 2006.  The left atrial appendage was ligated during that procedure.  She did not have coronary disease by preoperative angiography and has normal left ventricular systolic function. Followup echocardiography 2015 and 2017 shows borderline low left ventricular ejection fraction (45-50%). Valve repair looks good. She had a dual chamber permanent pacemaker (Medtronic) implanted in 2006 and has complete heart block . The right atrial lead is a conventional transvenous lead. The ventricular lead is an active fixation epicardial lead on the surface of the left ventricle placed at the time of her surgery. A generator change was performed in 2014. Her pacemaker is located in a deep subpectoral pocket. She has paroxysmal atrial fibrillation. She has treated hypertension, dyslipidemia and type 2 diabetes mellitus on the background of severe obesity.   Past Medical History:  Diagnosis Date  . Arthritis   . Atrial fibrillation ([place])    PAF---currently anticoagulated with Warfarin  . Atrial flutter (  [place]) 02/21/2004   s/p mitral valve repair and ppm insertion  . Congenital heart block 08/26/2003   Diagnosed by Dr. Clovis Fredrickson. A temp orary pacemaker was placed in  1988 during hysterectomy procedure.  . Diabetes mellitus without complication (HCC)    prediabetic  . Diverticulosis   . Dyslipidemia    takes Niacin and Crestor daily  . History of blood transfusion    no abnormal reaction noted  . Hypothyroidism    takes Synthroid daily  . Mitral valve insufficiency    Recieved Mitral valve repair with reconstruction of the anterior leaflet cordis with cortex and ring annuloplasty  . Nonischemic cardiomyopathy ([place]) 2006   EF of 35% due to AV dyssyncrony. Has since resolved per most recent ECHO.  Marland Kitchen RBBB    Due the VP with the LV epicardial lead that was inserted in 2006 during the mitral repair  . Systemic hypertension    takes  Ramipril daily-Maxzide every other day  . Urinary urgency     Past Surgical History:  Procedure Laterality Date  . ABDOMINAL HYSTERECTOMY    . BREAST LUMPECTOMY WITH RADIOACTIVE SEED LOCALIZATION Right 11/22/2014   Procedure: RIGHT BREAST LUMPECTOMY WITH RADIOACTIVE SEED LOCALIZATION;  Surgeon: Fanny Skates, MD;  Location: [facility];  Service: General;  Laterality: Right;  . CARDIAC CATHETERIZATION  10/20/2003   Minimal decrease in the LV systolic EF of A999333 with global hyperkinesis. Moderately dilated LV. 3+ Mitral regurgitation. Underfilling of LAD which promptly improved with intracoronary NTG administration. Microvascular angina or microvascular spasm. Otherwise coronaries were normal. No intervention necessary.  Marland Kitchen CARDIOVASCULAR STRESS TEST  09/14/2003   Performed for the pt's congenital CHB that was discovered during a preoperative evaluation for a colonoscopy.  EKG demonstrated CHB with junctional escape. Nonspecific ST changes noted. Stress ECG was positive for ischemia(2 mm ST depression). Dilated LV cavity. Inability to calculate systolic function. High risk scan. This scan was followed up with a cardiac cath.  . COLONOSCOPY    . MITRAL VALVE ANNULOPLASTY  02/06/2004   Performed for severe MR requiring mitral valve  repair with anterior leaflet reconstruction and a [place] annuloplasty ring--model: [id number], and an 80% lesion reduction (Dr. Servando Snare). Two LV leads were placed epicardially just in the event that biv pacing would be needed. (One of the leads had been used for during the ppm insertion recieved one week later)   . PERMANENT PACEMAKER GENERATOR CHANGE  07/06/2012   Medtronic  . PERMANENT PACEMAKER GENERATOR CHANGE N/A 07/06/2012   Procedure: PERMANENT PACEMAKER GENERATOR CHANGE;  Surgeon: Sanda Klein, MD;  Location: [facility] CATH [facility];  Service: Cardiovascular;  Laterality: N/A;  . PERMANENT PACEMAKER INSERTION  02/13/2004   PPM implant following a mitral valve annuloplasty. An epicardial active fixation lead (implanted on 02/06/2004 during the mitral valve procedure) was used for this implant.   . US ECHOCARDIOGRAPHY  09/10/2010 and1/07/2008   The last echo showed an EF of >55%; RV mildly dilated. Mild to moderately dilated LA  with borderline RA dilation as well. Impaired diastolic function. Normal RV systolic function.MVR without residual insufficiency.No Pericardial effusion.    Current Medications: Outpatient Medications Prior to Visit  Medication Sig Dispense Refill  . acetaminophen (TYLENOL) 325 MG tablet Take 650 mg by mouth as needed for pain.    . cholecalciferol (VITAMIN D) 400 UNITS TABS Take 400 Units by mouth daily.    Marland Kitchen HYDROcodone-acetaminophen (NORCO) 5-325 MG tablet Take 1-2 tablets by mouth every 6 (six) hours as  needed for moderate pain or severe pain. 30 tablet 0  . niacin 500 MG tablet Take 500 mg by mouth every evening.     . Omega-3 Fatty Acids (OMEGA 3 PO) Take 1,000 mg by mouth daily.    Marland Kitchen warfarin (COUMADIN) 5 MG tablet TAKE 1 TO 1 AND 1/2 TABLETS BY MOUTH EVERY DAY AS DIRECTED BY COUMADIN CLINIC 135 tablet 1  . warfarin (COUMADIN) 5 MG tablet TAKE 1 TO 1 AND 1/2 TABLETS BY MOUTH EVERY DAY AS DIRECTED BY COUMADIN CLINIC A999333 tablet 1  . folic acid (FOLVITE) 1  MG tablet Take 1 tablet (1 mg total) by mouth daily. MUST KEEP APPOINTMENT 08/31/18 WITH DR Ajanae Virag FOR FUTURE REFILLS 90 tablet 0  . levothyroxine (SYNTHROID, LEVOTHROID) 75 MCG tablet Take 75 mcg by mouth daily before breakfast.    . ramipril (ALTACE) 10 MG capsule Take 1 capsule (10 mg total) by mouth at bedtime. 90 capsule 1  . rosuvastatin (CRESTOR) 10 MG tablet Take 1 tablet (10 mg total) by mouth daily. 28 tablet 0  . sotalol (BETAPACE) 80 MG tablet Take 1 tablet (80 mg total) by mouth 2 (two) times daily. MUST KEEP APPOINTMENT 08/31/18 WITH DR Kemal Amores FOR FUTURE REFILLS 180 tablet 0  . triamterene-hydrochlorothiazide (MAXZIDE-25) 37.5-25 MG tablet TAKE 1/2 TABLET BY MOUTH EVERY DAY AS DIRECTED 15 tablet 9   No facility-administered medications prior to visit.      Allergies:   Patient has no known allergies.   Social History   Socioeconomic History  . Marital status: Single    Spouse name: Not on file  . Number of children: Not on file  . Years of education: Not on file  . Highest education level: Not on file  Occupational History  . Not on file  Social Needs  . Financial resource strain: Not on file  . Food insecurity    Worry: Not on file    Inability: Not on file  . Transportation needs    Medical: Not on file    Non-medical: Not on file  Tobacco Use  . Smoking status: Former Smoker    Types: Cigarettes  . Smokeless tobacco: Never Used  . Tobacco comment: quit smoking in 2006  Substance and Sexual Activity  . Alcohol use: Yes    Comment: wine occasionally  . Drug use: No  . Sexual activity: Yes    Birth control/protection: Surgical  Lifestyle  . Physical activity    Days per week: Not on file    Minutes per session: Not on file  . Stress: Not on file  Relationships  . Social Herbalist on phone: Not on file    Gets together: Not on file    Attends religious service: Not on file    Active member of club or organization: Not on file    Attends  meetings of clubs or organizations: Not on file    Relationship status: Not on file  Other Topics Concern  . Not on file  Social History Narrative  . Not on file     Family History:  The patient's family history includes Asthma in her brother and sister; Cancer - Other in her mother; Healthy in her brother, brother, brother, and brother; Pneumonia in her father.   ROS:   Please see the history of present illness.    ROS All other systems are reviewed and are negative.   PHYSICAL EXAM:   VS:  BP 121/81   Pulse  78   Temp (!) 97.2 F (36.2 C)   Ht 5\' 3"  (1.6 m)   Wt 207 lb (93.9 kg)   SpO2 95%   BMI 36.67 kg/m       General: Alert, oriented x3, no distress, severely obese Head: no evidence of trauma, PERRL, EOMI, no exophtalmos or lid lag, no myxedema, no xanthelasma; normal ears, nose and oropharynx Neck: normal jugular venous pulsations and no hepatojugular reflux; brisk carotid pulses without delay and no carotid bruits Chest: clear to auscultation, no signs of consolidation by percussion or palpation, normal fremitus, symmetrical and full respiratory excursions Cardiovascular: normal position and quality of the apical impulse, regular rhythm, normal first and paradoxically split second heart sounds, no murmurs, rubs or gallops.  Healthy left subclavian pacemaker site Abdomen: no tenderness or distention, no masses by palpation, no abnormal pulsatility or arterial bruits, normal bowel sounds, no hepatosplenomegaly Extremities: no clubbing, cyanosis or edema; 2+ radial, ulnar and brachial pulses bilaterally; 2+ right femoral, posterior tibial and dorsalis pedis pulses; 2+ left femoral, posterior tibial and dorsalis pedis pulses; no subclavian or femoral bruits Neurological: grossly nonfocal Psych: Normal mood and affect    Wt Readings from Last 3 Encounters:  11/20/18 207 lb (93.9 kg)  02/19/17 207 lb 3.2 oz (94 kg)  05/08/16 212 lb (96.2 kg)      Studies/Labs Reviewed:    EKG:  EKG is  ordered today.  Atrial sensed ventricular paced rhythm (initial EKG atrial tachycardia, second EKG sinus rhythm).  Paced QRS complex with R wave in lead V1, QTC 517 ms (on the slower EKG).  Recent Labs: 05/25/2018: BUN 19; Creatinine, Ser 0.86; Magnesium 2.2; Potassium 4.6; Sodium 142; TSH 5.560     ASSESSMENT:    1. PAF (paroxysmal atrial fibrillation) ([place])   2. Long term current use of anticoagulant therapy   3. Chronic combined systolic and diastolic heart failure ([place])   4. Heart block AV complete ([place])   5. Pacemaker   6. Status post mitral valve annuloplasty   7. Long term current use of anticoagulant   8. Encounter for monitoring sotalol therapy      PLAN:  In order of problems listed above:  1. Afib: She has proximal atrial fibrillation and atrial tachycardia (probably left atrial flutter related to the scar over atriotomy).  The episodes are asymptomatic and the overall burden is stable and relatively low.  She is therapeutically anticoagulated. 2. CHF: She has no symptoms of congestive heart failure and maintains euvolemia without loop diuretics. EF 45-50%, some variation in evaluation, but direct image comparison shows little change. Preop EF was 40-45%, so current EF is expected. On ACE inhibitor and "beta blocker" (sotalol).  Appears clinically euvolemic, NYHA functional class II 3. CHB: pacemaker dependent. Her V pacing lead is an epicardial LV lead. Upgrade to CRT is therefore less likely to be of benefit. 4. PM: normal device function.  Remote downloads every 3 months and yearly office visit 5. MR s/p annuloplasty: she has had a durable and good result from her annuloplasty 6. Warfarin: Although her embolic risk would appear to be quite high due to the presence of left ventricular dysfunction valvular heart disease should be noted that she also had surgical closure or her left atrial appendage at the time of mitral surgery and only mildly data left atrium.  CHADSVasc at least 25 (age, gender, LV dysfunction/CHF, hypertension) 7. Sotalol: QT is prolonged in large part due to a very broad paced QRS complex.  Current QT values less than has been in the past and is quite acceptable.  Need to check renal function and potassium level at least every 6 months.  Labs are ordered today.   Reminded her about the potential for multiple drug-drug interactions with sotalol and warfarin.   Medication Adjustments/Labs and Tests Ordered: Current medicines are reviewed at length with the patient today.  Concerns regarding medicines are outlined above.  Medication changes, Labs and Tests ordered today are listed in the Patient Instructions below. Patient Instructions  Medication Instructions:  No changes *If you need a refill on your cardiac medications before your next appointment, please call your pharmacy*  Lab Work: Your provider would like for you to have the following labs today: CBC and BMET  If you have labs (blood work) drawn today and your tests are completely normal, you will receive your results only by: Marland Kitchen MyChart Message (if you have MyChart) OR . A paper copy in the mail If you have any lab test that is abnormal or we need to change your treatment, we will call you to review the results.  Testing/Procedures: None ordered  Follow-Up: At [facility], you and your health needs are our priority.  As part of our continuing mission to provide you with exceptional heart care, we have created designated Provider Care Teams.  These Care Teams include your primary Cardiologist (physician) and Advanced Practice Providers (APPs -  Physician Assistants and Nurse Practitioners) who all work together to provide you with the care you need, when you need it.  Your next appointment:   6 months  The format for your next appointment:   In Person  Provider:   Sanda Klein, MD       Signed, Sanda Klein, MD  11/21/2018 5:40 PM    [facility] [address], [place], [place]  [address] Phone: [phone]; Fax: [phone]

## 2018-11-20 NOTE — Patient Instructions (Signed)
Medication Instructions:  No changes *If you need a refill on your cardiac medications before your next appointment, please call your pharmacy*  Lab Work: Your provider would like for you to have the following labs today: CBC and BMET  If you have labs (blood work) drawn today and your tests are completely normal, you will receive your results only by: Marland Kitchen MyChart Message (if you have MyChart) OR . A paper copy in the mail If you have any lab test that is abnormal or we need to change your treatment, we will call you to review the results.  Testing/Procedures: None ordered  Follow-Up: At [facility], you and your health needs are our priority.  As part of our continuing mission to provide you with exceptional heart care, we have created designated Provider Care Teams.  These Care Teams include your primary Cardiologist (physician) and Advanced Practice Providers (APPs -  Physician Assistants and Nurse Practitioners) who all work together to provide you with the care you need, when you need it.  Your next appointment:   6 months  The format for your next appointment:   In Person  Provider:   Sanda Klein, MD

## 2018-11-21 ENCOUNTER — Encounter: Payer: Self-pay | Admitting: Cardiovascular Disease

## 2018-12-04 NOTE — Progress Notes (Signed)
Remote pacemaker transmission.   

## 2018-12-21 ENCOUNTER — Other Ambulatory Visit: Payer: Self-pay

## 2018-12-21 MED ORDER — SOTALOL HCL 80 MG PO TABS: 80.0000 mg | ORAL_TABLET | Freq: Two times a day (BID) | ORAL | 3 refills | Status: DC

## 2019-01-06 ENCOUNTER — Other Ambulatory Visit: Payer: Self-pay | Admitting: Pharmacist

## 2019-01-06 MED ORDER — WARFARIN SODIUM 5 MG PO TABS: ORAL_TABLET | ORAL | 1 refills | Status: DC

## 2019-01-11 ENCOUNTER — Ambulatory Visit (INDEPENDENT_AMBULATORY_CARE_PROVIDER_SITE_OTHER): Payer: Medicare Other | Admitting: Pharmacist

## 2019-01-11 ENCOUNTER — Other Ambulatory Visit: Payer: Self-pay

## 2019-01-11 DIAGNOSIS — I480 Paroxysmal atrial fibrillation: Secondary | ICD-10-CM | POA: Diagnosis not present

## 2019-01-11 DIAGNOSIS — Z7901 Long term (current) use of anticoagulants: Secondary | ICD-10-CM

## 2019-01-11 LAB — BASIC METABOLIC PANEL
BUN/Creatinine Ratio: 14 (ref 12–28)
BUN: 13 mg/dL (ref 8–27)
CO2: 28 mmol/L (ref 20–29)
Calcium: 9.2 mg/dL (ref 8.7–10.3)
Chloride: 104 mmol/L (ref 96–106)
Creatinine, Ser: 0.93 mg/dL (ref 0.57–1.00)
GFR calc Af Amer: 71 mL/min/{1.73_m2} (ref >59)
GFR calc non Af Amer: 62 mL/min/{1.73_m2} (ref >59)
Glucose: 88 mg/dL (ref 65–99)
Potassium: 3.9 mmol/L (ref 3.5–5.2)
Sodium: 142 mmol/L (ref 134–144)

## 2019-01-11 LAB — CBC
Hematocrit: 38.4 % (ref 34.0–46.6)
Hemoglobin: 12.6 g/dL (ref 11.1–15.9)
MCH: 27.2 pg (ref 26.6–33.0)
MCHC: 32.8 g/dL (ref 31.5–35.7)
MCV: 83 fL (ref 79–97)
Platelets: 220 10*3/uL (ref 150–450)
RBC: 4.63 x10E6/uL (ref 3.77–5.28)
RDW: 13.2 % (ref 11.7–15.4)
WBC: 4.8 10*3/uL (ref 3.4–10.8)

## 2019-01-11 LAB — POCT INR: INR: 3 (ref 2.0–3.0)

## 2019-01-12 LAB — LIPID PANEL
Chol/HDL Ratio: 3.3 ratio (ref 0.0–4.4)
Cholesterol, Total: 164 mg/dL (ref 100–199)
HDL: 50 mg/dL (ref >39)
LDL Chol Calc (NIH): 99 mg/dL (ref 0–99)
Triglycerides: 76 mg/dL (ref 0–149)
VLDL Cholesterol Cal: 15 mg/dL (ref 5–40)

## 2019-02-15 ENCOUNTER — Ambulatory Visit (INDEPENDENT_AMBULATORY_CARE_PROVIDER_SITE_OTHER): Payer: Medicare PPO | Admitting: *Deleted

## 2019-02-15 DIAGNOSIS — I442 Atrioventricular block, complete: Secondary | ICD-10-CM | POA: Diagnosis not present

## 2019-02-15 LAB — CUP PACEART REMOTE DEVICE CHECK
Battery Impedance: 878 Ohm
Battery Remaining Longevity: 59 mo
Battery Voltage: 2.79 V
Brady Statistic AP VP Percent: 28 %
Brady Statistic AP VS Percent: 0 %
Brady Statistic AS VP Percent: 72 %
Brady Statistic AS VS Percent: 0 %
Date Time Interrogation Session: 20210118072233
Implantable Lead Implant Date: 20060109
Implantable Lead Implant Date: 20060116
Implantable Lead Location: 753858
Implantable Lead Location: 753859
Implantable Lead Model: 5071
Implantable Lead Model: 5076
Implantable Pulse Generator Implant Date: 20140609
Lead Channel Impedance Value: 426 Ohm
Lead Channel Impedance Value: 483 Ohm
Lead Channel Pacing Threshold Amplitude: 1 V
Lead Channel Pacing Threshold Amplitude: 1.125 V
Lead Channel Pacing Threshold Pulse Width: 0.4 ms
Lead Channel Pacing Threshold Pulse Width: 0.4 ms
Lead Channel Setting Pacing Amplitude: 2 V
Lead Channel Setting Pacing Amplitude: 2.5 V
Lead Channel Setting Pacing Pulse Width: 0.4 ms
Lead Channel Setting Sensing Sensitivity: 4 mV

## 2019-02-16 ENCOUNTER — Ambulatory Visit: Payer: Medicare PPO | Attending: Internal Medicine

## 2019-02-16 DIAGNOSIS — Z23 Encounter for immunization: Secondary | ICD-10-CM | POA: Insufficient documentation

## 2019-02-16 NOTE — Progress Notes (Signed)
   Covid-19 Vaccination Clinic  Name:  ANNIE ELLIN    MRN: [id number] DOB: 08/21/1945  02/16/2019  Ms. Vassel was observed post Covid-19 immunization for 15 minutes without incidence. She was provided with Vaccine Information Sheet and instruction to access the V-Safe system.   Ms. Hackbarth was instructed to call 911 with any severe reactions post vaccine: Marland Kitchen Difficulty breathing  . Swelling of your face and throat  . A fast heartbeat  . A bad rash all over your body  . Dizziness and weakness    Immunizations Administered    Name Date Dose VIS Date Route   Pfizer COVID-19 Vaccine 02/16/2019 12:56 PM 0.3 mL 01/08/2019 Intramuscular   Manufacturer: [place]   Lot: [id number]   [place]: [id number]

## 2019-03-08 ENCOUNTER — Ambulatory Visit: Payer: Medicare PPO | Attending: Internal Medicine

## 2019-03-08 ENCOUNTER — Ambulatory Visit (INDEPENDENT_AMBULATORY_CARE_PROVIDER_SITE_OTHER): Payer: Medicare PPO | Admitting: Pharmacist

## 2019-03-08 ENCOUNTER — Other Ambulatory Visit: Payer: Self-pay

## 2019-03-08 DIAGNOSIS — Z23 Encounter for immunization: Secondary | ICD-10-CM | POA: Insufficient documentation

## 2019-03-08 DIAGNOSIS — Z7901 Long term (current) use of anticoagulants: Secondary | ICD-10-CM

## 2019-03-08 DIAGNOSIS — I480 Paroxysmal atrial fibrillation: Secondary | ICD-10-CM | POA: Diagnosis not present

## 2019-03-08 LAB — POCT INR: INR: 2.5 (ref 2.0–3.0)

## 2019-03-08 NOTE — Progress Notes (Signed)
   Covid-19 Vaccination Clinic  Name:  [name]    MRN: [id number] DOB: 06/22/1945  03/08/2019  [name] was observed post Covid-19 immunization for 15 minutes without incidence. She was provided with Vaccine Information Sheet and instruction to access the V-Safe system.   [name] was instructed to call 911 with any severe reactions post vaccine: Marland Kitchen Difficulty breathing  . Swelling of your face and throat  . A fast heartbeat  . A bad rash all over your body  . Dizziness and weakness    Immunizations Administered    Name Date Dose VIS Date Route   Pfizer COVID-19 Vaccine 03/08/2019  9:28 AM 0.3 mL 01/08/2019 Intramuscular   Manufacturer: [place]   Lot: [id number]   [place]: [id number]

## 2019-05-04 ENCOUNTER — Other Ambulatory Visit: Payer: Self-pay

## 2019-05-04 ENCOUNTER — Ambulatory Visit (INDEPENDENT_AMBULATORY_CARE_PROVIDER_SITE_OTHER): Payer: Medicare PPO | Admitting: Pharmacist

## 2019-05-04 DIAGNOSIS — I480 Paroxysmal atrial fibrillation: Secondary | ICD-10-CM | POA: Diagnosis not present

## 2019-05-04 DIAGNOSIS — Z7901 Long term (current) use of anticoagulants: Secondary | ICD-10-CM | POA: Diagnosis not present

## 2019-05-04 LAB — POCT INR: INR: 2.3 (ref 2.0–3.0)

## 2019-05-17 ENCOUNTER — Ambulatory Visit (INDEPENDENT_AMBULATORY_CARE_PROVIDER_SITE_OTHER): Payer: Medicare PPO | Admitting: *Deleted

## 2019-05-17 DIAGNOSIS — I442 Atrioventricular block, complete: Secondary | ICD-10-CM

## 2019-05-17 LAB — CUP PACEART REMOTE DEVICE CHECK
Battery Impedance: 956 Ohm
Battery Remaining Longevity: 56 mo
Battery Voltage: 2.78 V
Brady Statistic AP VP Percent: 24 %
Brady Statistic AP VS Percent: 0 %
Brady Statistic AS VP Percent: 76 %
Brady Statistic AS VS Percent: 0 %
Date Time Interrogation Session: 20210419071413
Implantable Lead Implant Date: 20060109
Implantable Lead Implant Date: 20060116
Implantable Lead Location: 753858
Implantable Lead Location: 753859
Implantable Lead Model: 5071
Implantable Lead Model: 5076
Implantable Pulse Generator Implant Date: 20140609
Lead Channel Impedance Value: 421 Ohm
Lead Channel Impedance Value: 475 Ohm
Lead Channel Pacing Threshold Amplitude: 1.125 V
Lead Channel Pacing Threshold Amplitude: 1.25 V
Lead Channel Pacing Threshold Pulse Width: 0.4 ms
Lead Channel Pacing Threshold Pulse Width: 0.4 ms
Lead Channel Setting Pacing Amplitude: 2.25 V
Lead Channel Setting Pacing Amplitude: 2.5 V
Lead Channel Setting Pacing Pulse Width: 0.4 ms
Lead Channel Setting Sensing Sensitivity: 4 mV

## 2019-05-17 NOTE — Progress Notes (Signed)
PPM Remote  

## 2019-06-16 ENCOUNTER — Other Ambulatory Visit: Payer: Self-pay

## 2019-06-16 ENCOUNTER — Ambulatory Visit (INDEPENDENT_AMBULATORY_CARE_PROVIDER_SITE_OTHER): Payer: Medicare PPO | Admitting: Pharmacist Clinician (PhC)/ Clinical Pharmacy Specialist

## 2019-06-16 DIAGNOSIS — I480 Paroxysmal atrial fibrillation: Secondary | ICD-10-CM

## 2019-06-16 DIAGNOSIS — Z7901 Long term (current) use of anticoagulants: Secondary | ICD-10-CM

## 2019-06-16 LAB — POCT INR: INR: 2.8 (ref 2.0–3.0)

## 2019-07-05 ENCOUNTER — Other Ambulatory Visit: Payer: Self-pay | Admitting: Pharmacist

## 2019-07-05 MED ORDER — WARFARIN SODIUM 5 MG PO TABS: ORAL_TABLET | ORAL | 1 refills | Status: DC

## 2019-07-28 ENCOUNTER — Ambulatory Visit (INDEPENDENT_AMBULATORY_CARE_PROVIDER_SITE_OTHER): Payer: Medicare PPO | Admitting: Pharmacist Clinician (PhC)/ Clinical Pharmacy Specialist

## 2019-07-28 ENCOUNTER — Other Ambulatory Visit: Payer: Self-pay

## 2019-07-28 DIAGNOSIS — I480 Paroxysmal atrial fibrillation: Secondary | ICD-10-CM

## 2019-07-28 DIAGNOSIS — Z7901 Long term (current) use of anticoagulants: Secondary | ICD-10-CM | POA: Diagnosis not present

## 2019-07-28 LAB — POCT INR: INR: 2.1 (ref 2.0–3.0)

## 2019-07-28 NOTE — Patient Instructions (Signed)
Continue taking 1 tablets daily except for 1.5 tablet  each Sunday, Tuesday and Thursday. Repeat INR in 6 weeks.

## 2019-08-16 ENCOUNTER — Ambulatory Visit (INDEPENDENT_AMBULATORY_CARE_PROVIDER_SITE_OTHER): Payer: Medicare PPO | Admitting: *Deleted

## 2019-08-16 DIAGNOSIS — R001 Bradycardia, unspecified: Secondary | ICD-10-CM | POA: Diagnosis not present

## 2019-08-16 LAB — CUP PACEART REMOTE DEVICE CHECK
Battery Impedance: 1061 Ohm
Battery Remaining Longevity: 53 mo
Battery Voltage: 2.79 V
Brady Statistic AP VP Percent: 25 %
Brady Statistic AP VS Percent: 0 %
Brady Statistic AS VP Percent: 74 %
Brady Statistic AS VS Percent: 0 %
Date Time Interrogation Session: 20210719082127
Implantable Lead Implant Date: 20060109
Implantable Lead Implant Date: 20060116
Implantable Lead Location: 753858
Implantable Lead Location: 753859
Implantable Lead Model: 5071
Implantable Lead Model: 5076
Implantable Pulse Generator Implant Date: 20140609
Lead Channel Impedance Value: 415 Ohm
Lead Channel Impedance Value: 488 Ohm
Lead Channel Pacing Threshold Amplitude: 1 V
Lead Channel Pacing Threshold Amplitude: 1.25 V
Lead Channel Pacing Threshold Pulse Width: 0.4 ms
Lead Channel Pacing Threshold Pulse Width: 0.4 ms
Lead Channel Setting Pacing Amplitude: 2 V
Lead Channel Setting Pacing Amplitude: 2.5 V
Lead Channel Setting Pacing Pulse Width: 0.4 ms
Lead Channel Setting Sensing Sensitivity: 4 mV

## 2019-08-17 NOTE — Progress Notes (Signed)
Remote pacemaker transmission.   

## 2019-09-08 ENCOUNTER — Other Ambulatory Visit: Payer: Self-pay

## 2019-09-08 ENCOUNTER — Ambulatory Visit (INDEPENDENT_AMBULATORY_CARE_PROVIDER_SITE_OTHER): Payer: Medicare PPO

## 2019-09-08 DIAGNOSIS — Z7901 Long term (current) use of anticoagulants: Secondary | ICD-10-CM | POA: Diagnosis not present

## 2019-09-08 DIAGNOSIS — Z5181 Encounter for therapeutic drug level monitoring: Secondary | ICD-10-CM

## 2019-09-08 DIAGNOSIS — I480 Paroxysmal atrial fibrillation: Secondary | ICD-10-CM

## 2019-09-08 LAB — POCT INR: INR: 3.1 — AB (ref 2.0–3.0)

## 2019-09-08 NOTE — Patient Instructions (Signed)
Pt already took Warfarin today so will only take 1 tablet on Thursday. Continue taking 1 tablet daily except for 1.5 tablet  each Sunday, Tuesday and Thursday. Repeat INR in 6 weeks.

## 2019-09-13 ENCOUNTER — Telehealth: Payer: Self-pay

## 2019-09-13 NOTE — Telephone Encounter (Signed)
   Primary Cardiologist: Sanda Klein, MD  Chart reviewed as part of pre-operative protocol coverage. Cataract extractions are recognized in guidelines as low risk surgeries that do not typically require specific preoperative testing or holding of blood thinner therapy. Therefore, given past medical history and time since last visit, based on ACC/AHA guidelines, [name] would be at acceptable risk for the planned procedure without further cardiovascular testing.   I will route this recommendation to the requesting party via Epic fax function and remove from pre-op pool.  Please call with questions.  Deberah Pelton, NP 09/13/2019, 11:28 AM

## 2019-09-13 NOTE — Telephone Encounter (Signed)
   [facility] Pre-operative Risk Assessment     Request for surgical clearance:  What type of surgery is being performed? CATARACT EXTRACTION W/INTRAOCULAR LENS IMPLANTATION OF RIGHT EYE THEN LEFT EYE   When is this surgery scheduled? 09-22-2019   What type of clearance is required (medical clearance vs. Pharmacy clearance to hold med vs. Both)? MEDICAL  Are there any medications that need to be held prior to surgery and how long? NONE   Practice name and name of physician performing surgery? [facility] ATTN:THERESA   What is the office phone number? [phone]   7.   What is the office fax number? [phone]  8.   Anesthesia type (None, local, MAC, general) ? LOT [id number]

## 2019-09-24 ENCOUNTER — Other Ambulatory Visit: Payer: Medicare PPO

## 2019-09-24 ENCOUNTER — Other Ambulatory Visit: Payer: Self-pay | Admitting: Sleep Medicine

## 2019-09-24 DIAGNOSIS — Z20822 Contact with and (suspected) exposure to covid-19: Secondary | ICD-10-CM

## 2019-09-24 NOTE — Progress Notes (Signed)
now

## 2019-09-26 LAB — NOVEL CORONAVIRUS, NAA

## 2019-09-29 DIAGNOSIS — Z9849 Cataract extraction status, unspecified eye: Secondary | ICD-10-CM | POA: Insufficient documentation

## 2019-10-20 ENCOUNTER — Other Ambulatory Visit: Payer: Self-pay

## 2019-10-20 ENCOUNTER — Ambulatory Visit (INDEPENDENT_AMBULATORY_CARE_PROVIDER_SITE_OTHER): Payer: Medicare PPO

## 2019-10-20 DIAGNOSIS — Z5181 Encounter for therapeutic drug level monitoring: Secondary | ICD-10-CM | POA: Diagnosis not present

## 2019-10-20 DIAGNOSIS — I480 Paroxysmal atrial fibrillation: Secondary | ICD-10-CM

## 2019-10-20 DIAGNOSIS — Z7901 Long term (current) use of anticoagulants: Secondary | ICD-10-CM | POA: Diagnosis not present

## 2019-10-20 LAB — POCT INR: INR: 2.6 (ref 2.0–3.0)

## 2019-10-20 NOTE — Patient Instructions (Signed)
Continue taking 1 tablet daily except for 1.5 tablet  each Sunday, Tuesday and Thursday. Repeat INR in 6 weeks. 

## 2019-11-15 ENCOUNTER — Ambulatory Visit (INDEPENDENT_AMBULATORY_CARE_PROVIDER_SITE_OTHER): Payer: Medicare PPO

## 2019-11-15 DIAGNOSIS — R001 Bradycardia, unspecified: Secondary | ICD-10-CM

## 2019-11-16 LAB — CUP PACEART REMOTE DEVICE CHECK
Battery Impedance: 1194 Ohm
Battery Remaining Longevity: 49 mo
Battery Voltage: 2.78 V
Brady Statistic AP VP Percent: 27 %
Brady Statistic AP VS Percent: 0 %
Brady Statistic AS VP Percent: 73 %
Brady Statistic AS VS Percent: 0 %
Date Time Interrogation Session: 20211019083222
Implantable Lead Implant Date: 20060109
Implantable Lead Implant Date: 20060116
Implantable Lead Location: 753858
Implantable Lead Location: 753859
Implantable Lead Model: 5071
Implantable Lead Model: 5076
Implantable Pulse Generator Implant Date: 20140609
Lead Channel Impedance Value: 422 Ohm
Lead Channel Impedance Value: 495 Ohm
Lead Channel Pacing Threshold Amplitude: 1.125 V
Lead Channel Pacing Threshold Amplitude: 1.25 V
Lead Channel Pacing Threshold Pulse Width: 0.4 ms
Lead Channel Pacing Threshold Pulse Width: 0.4 ms
Lead Channel Setting Pacing Amplitude: 2.25 V
Lead Channel Setting Pacing Amplitude: 2.5 V
Lead Channel Setting Pacing Pulse Width: 0.4 ms
Lead Channel Setting Sensing Sensitivity: 4 mV

## 2019-11-18 NOTE — Progress Notes (Signed)
Remote pacemaker transmission.   

## 2019-11-23 ENCOUNTER — Other Ambulatory Visit: Payer: Self-pay | Admitting: *Deleted

## 2019-11-23 MED ORDER — FOLIC ACID 1 MG PO TABS: 1.0000 mg | ORAL_TABLET | Freq: Every day | ORAL | 3 refills | Status: DC

## 2019-11-23 NOTE — Telephone Encounter (Signed)
Discussed with Cristopher Peru RN with Dr Sallyanne Kuster Per Lattie Haw he said ok to fill, rx sent to [facility]

## 2019-12-01 ENCOUNTER — Ambulatory Visit (INDEPENDENT_AMBULATORY_CARE_PROVIDER_SITE_OTHER): Payer: Medicare PPO

## 2019-12-01 ENCOUNTER — Other Ambulatory Visit: Payer: Self-pay

## 2019-12-01 DIAGNOSIS — Z5181 Encounter for therapeutic drug level monitoring: Secondary | ICD-10-CM

## 2019-12-01 DIAGNOSIS — Z7901 Long term (current) use of anticoagulants: Secondary | ICD-10-CM

## 2019-12-01 DIAGNOSIS — I480 Paroxysmal atrial fibrillation: Secondary | ICD-10-CM | POA: Diagnosis not present

## 2019-12-01 LAB — POCT INR: INR: 2.6 (ref 2.0–3.0)

## 2019-12-01 NOTE — Patient Instructions (Signed)
Continue taking 1 tablet daily except for 1.5 tablet  each Sunday, Tuesday and Thursday. Repeat INR in 6 weeks.

## 2019-12-19 ENCOUNTER — Other Ambulatory Visit: Payer: Self-pay | Admitting: Cardiovascular Disease

## 2019-12-20 ENCOUNTER — Telehealth: Payer: Self-pay | Admitting: Cardiovascular Disease

## 2019-12-20 NOTE — Telephone Encounter (Signed)
*  STAT* If patient is at the pharmacy, call can be transferred to refill team.   1. Which medications need to be refilled? (please list name of each medication and dose if known)  ramipril (ALTACE) 10 MG capsule levothyroxine (SYNTHROID) 75 MCG tablet 2. Which pharmacy/location (including street and city if local pharmacy) is medication to be sent to? [facility] [phone] - [place], [place]  3. Do they need a 30 day or 90 day supply? 90 day supply

## 2019-12-20 NOTE — Telephone Encounter (Signed)
Left message to call back  

## 2020-01-12 ENCOUNTER — Ambulatory Visit (INDEPENDENT_AMBULATORY_CARE_PROVIDER_SITE_OTHER): Payer: Medicare PPO

## 2020-01-12 ENCOUNTER — Other Ambulatory Visit: Payer: Self-pay

## 2020-01-12 DIAGNOSIS — Z7901 Long term (current) use of anticoagulants: Secondary | ICD-10-CM | POA: Diagnosis not present

## 2020-01-12 DIAGNOSIS — I480 Paroxysmal atrial fibrillation: Secondary | ICD-10-CM

## 2020-01-12 DIAGNOSIS — Z5181 Encounter for therapeutic drug level monitoring: Secondary | ICD-10-CM

## 2020-01-12 LAB — POCT INR: INR: 1.6 — AB (ref 2.0–3.0)

## 2020-01-12 NOTE — Patient Instructions (Signed)
Take 2 tablets today and then Continue taking 1 tablet daily except for 1.5 tablet  each Sunday, Tuesday and Thursday. Repeat INR in 6 weeks.

## 2020-01-27 ENCOUNTER — Other Ambulatory Visit: Payer: Self-pay

## 2020-01-27 ENCOUNTER — Other Ambulatory Visit: Payer: Medicare PPO

## 2020-01-27 DIAGNOSIS — Z20822 Contact with and (suspected) exposure to covid-19: Secondary | ICD-10-CM

## 2020-01-27 NOTE — Addendum Note (Signed)
Addended by: Hilda Lias on: 01/27/2020 10:27 AM   Modules accepted: Orders

## 2020-01-30 LAB — NOVEL CORONAVIRUS, NAA

## 2020-02-14 ENCOUNTER — Ambulatory Visit (INDEPENDENT_AMBULATORY_CARE_PROVIDER_SITE_OTHER): Payer: Medicare PPO

## 2020-02-14 DIAGNOSIS — I442 Atrioventricular block, complete: Secondary | ICD-10-CM | POA: Diagnosis not present

## 2020-02-14 LAB — CUP PACEART REMOTE DEVICE CHECK
Battery Impedance: 1301 Ohm
Battery Remaining Longevity: 46 mo
Battery Voltage: 2.78 V
Brady Statistic AP VP Percent: 27 %
Brady Statistic AP VS Percent: 0 %
Brady Statistic AS VP Percent: 73 %
Brady Statistic AS VS Percent: 0 %
Date Time Interrogation Session: 20220117073005
Implantable Lead Implant Date: 20060109
Implantable Lead Implant Date: 20060116
Implantable Lead Location: 753858
Implantable Lead Location: 753859
Implantable Lead Model: 5071
Implantable Lead Model: 5076
Implantable Pulse Generator Implant Date: 20140609
Lead Channel Impedance Value: 422 Ohm
Lead Channel Impedance Value: 482 Ohm
Lead Channel Pacing Threshold Amplitude: 1 V
Lead Channel Pacing Threshold Amplitude: 1.125 V
Lead Channel Pacing Threshold Pulse Width: 0.4 ms
Lead Channel Pacing Threshold Pulse Width: 0.4 ms
Lead Channel Setting Pacing Amplitude: 2 V
Lead Channel Setting Pacing Amplitude: 2.5 V
Lead Channel Setting Pacing Pulse Width: 0.4 ms
Lead Channel Setting Sensing Sensitivity: 4 mV

## 2020-02-23 ENCOUNTER — Ambulatory Visit (INDEPENDENT_AMBULATORY_CARE_PROVIDER_SITE_OTHER): Payer: Medicare PPO

## 2020-02-23 ENCOUNTER — Other Ambulatory Visit: Payer: Self-pay

## 2020-02-23 DIAGNOSIS — I480 Paroxysmal atrial fibrillation: Secondary | ICD-10-CM

## 2020-02-23 DIAGNOSIS — Z7901 Long term (current) use of anticoagulants: Secondary | ICD-10-CM

## 2020-02-23 DIAGNOSIS — Z5181 Encounter for therapeutic drug level monitoring: Secondary | ICD-10-CM

## 2020-02-23 LAB — POCT INR: INR: 2.4 (ref 2.0–3.0)

## 2020-02-23 NOTE — Patient Instructions (Signed)
Continue taking 1 tablet daily except for 1.5 tablet  each Sunday, Tuesday and Thursday. Repeat INR in 6 weeks. 

## 2020-02-26 NOTE — Progress Notes (Signed)
Remote pacemaker transmission.   

## 2020-04-05 ENCOUNTER — Ambulatory Visit (INDEPENDENT_AMBULATORY_CARE_PROVIDER_SITE_OTHER): Payer: Medicare PPO

## 2020-04-05 ENCOUNTER — Other Ambulatory Visit: Payer: Self-pay

## 2020-04-05 DIAGNOSIS — I480 Paroxysmal atrial fibrillation: Secondary | ICD-10-CM

## 2020-04-05 DIAGNOSIS — Z7901 Long term (current) use of anticoagulants: Secondary | ICD-10-CM

## 2020-04-05 DIAGNOSIS — Z5181 Encounter for therapeutic drug level monitoring: Secondary | ICD-10-CM

## 2020-04-05 LAB — POCT INR: INR: 2.6 (ref 2.0–3.0)

## 2020-04-05 NOTE — Patient Instructions (Signed)
Continue taking 1 tablet daily except for 1.5 tablets  each Sunday, Tuesday and Thursday. Repeat INR in 6 weeks.

## 2020-05-05 ENCOUNTER — Other Ambulatory Visit: Payer: Self-pay | Admitting: Cardiovascular Disease

## 2020-05-05 ENCOUNTER — Other Ambulatory Visit: Payer: Self-pay

## 2020-05-05 ENCOUNTER — Telehealth: Payer: Self-pay | Admitting: Cardiovascular Disease

## 2020-05-05 MED ORDER — SOTALOL HCL 80 MG PO TABS: 80.0000 mg | ORAL_TABLET | Freq: Two times a day (BID) | ORAL | 0 refills | Status: DC

## 2020-05-05 NOTE — Telephone Encounter (Signed)
Medication was refilled for 30 day supply. Patient must keep future appointment for future refills.

## 2020-05-05 NOTE — Telephone Encounter (Signed)
*  STAT* If patient is at the pharmacy, call can be transferred to refill team.   1. Which medications need to be refilled? (please list name of each medication and dose if known) sotalol (BETAPACE) 80 MG tablet  2. Which pharmacy/location (including street and city if local pharmacy) is medication to be sent to? [place], [address] [place] AT [place]  3. Do they need a 30 day or 90 day supply? [place]

## 2020-05-15 ENCOUNTER — Ambulatory Visit (INDEPENDENT_AMBULATORY_CARE_PROVIDER_SITE_OTHER): Payer: Medicare PPO

## 2020-05-15 DIAGNOSIS — I442 Atrioventricular block, complete: Secondary | ICD-10-CM

## 2020-05-16 LAB — CUP PACEART REMOTE DEVICE CHECK
Battery Impedance: 1411 Ohm
Battery Remaining Longevity: 43 mo
Battery Voltage: 2.77 V
Brady Statistic AP VP Percent: 27 %
Brady Statistic AP VS Percent: 0 %
Brady Statistic AS VP Percent: 73 %
Brady Statistic AS VS Percent: 0 %
Date Time Interrogation Session: 20220418073038
Implantable Lead Implant Date: 20060109
Implantable Lead Implant Date: 20060116
Implantable Lead Location: 753858
Implantable Lead Location: 753859
Implantable Lead Model: 5071
Implantable Lead Model: 5076
Implantable Pulse Generator Implant Date: 20140609
Lead Channel Impedance Value: 390 Ohm
Lead Channel Impedance Value: 476 Ohm
Lead Channel Pacing Threshold Amplitude: 1 V
Lead Channel Pacing Threshold Amplitude: 1.25 V
Lead Channel Pacing Threshold Pulse Width: 0.4 ms
Lead Channel Pacing Threshold Pulse Width: 0.4 ms
Lead Channel Setting Pacing Amplitude: 2 V
Lead Channel Setting Pacing Amplitude: 2.5 V
Lead Channel Setting Pacing Pulse Width: 0.4 ms
Lead Channel Setting Sensing Sensitivity: 4 mV

## 2020-05-17 ENCOUNTER — Other Ambulatory Visit: Payer: Self-pay

## 2020-05-17 ENCOUNTER — Ambulatory Visit (INDEPENDENT_AMBULATORY_CARE_PROVIDER_SITE_OTHER): Payer: Medicare PPO

## 2020-05-17 DIAGNOSIS — Z7901 Long term (current) use of anticoagulants: Secondary | ICD-10-CM

## 2020-05-17 DIAGNOSIS — Z5181 Encounter for therapeutic drug level monitoring: Secondary | ICD-10-CM

## 2020-05-17 DIAGNOSIS — I480 Paroxysmal atrial fibrillation: Secondary | ICD-10-CM | POA: Diagnosis not present

## 2020-05-17 LAB — POCT INR: INR: 3 (ref 2.0–3.0)

## 2020-05-17 NOTE — Patient Instructions (Signed)
Continue taking 1 tablet daily except for 1.5 tablets  each Sunday, Tuesday and Thursday. Repeat INR in 6 weeks.

## 2020-05-30 NOTE — Progress Notes (Signed)
Remote pacemaker transmission.   

## 2020-06-05 ENCOUNTER — Other Ambulatory Visit: Payer: Self-pay | Admitting: Cardiovascular Disease

## 2020-06-05 ENCOUNTER — Other Ambulatory Visit: Payer: Self-pay

## 2020-06-05 NOTE — Telephone Encounter (Signed)
Rx(s) sent to pharmacy electronically.  

## 2020-06-06 ENCOUNTER — Other Ambulatory Visit: Payer: Self-pay

## 2020-06-06 ENCOUNTER — Telehealth: Payer: Self-pay | Admitting: Cardiovascular Disease

## 2020-06-06 MED ORDER — SOTALOL HCL 80 MG PO TABS: ORAL_TABLET | ORAL | 0 refills | Status: DC

## 2020-06-06 MED ORDER — ROSUVASTATIN CALCIUM 10 MG PO TABS: 10.0000 mg | ORAL_TABLET | Freq: Every day | ORAL | 0 refills | Status: DC

## 2020-06-06 NOTE — Telephone Encounter (Signed)
*  STAT* If patient is at the pharmacy, call can be transferred to refill team.   1. Which medications need to be refilled? (please list name of each medication and dose if known) sotalol (BETAPACE) 80 MG tablet rosuvastatin (CRESTOR) 10 MG tablet  2. Which pharmacy/location (including street and city if local pharmacy) is medication to be sent to? [place], [place] AT [place]  3. Do they need a 30 day or 90 day supply? 90 day supply

## 2020-07-07 ENCOUNTER — Encounter: Payer: Self-pay | Admitting: Physician Assistant

## 2020-07-07 ENCOUNTER — Ambulatory Visit (INDEPENDENT_AMBULATORY_CARE_PROVIDER_SITE_OTHER): Payer: Medicare PPO | Admitting: Physician Assistant

## 2020-07-07 ENCOUNTER — Other Ambulatory Visit: Payer: Self-pay

## 2020-07-07 ENCOUNTER — Ambulatory Visit (INDEPENDENT_AMBULATORY_CARE_PROVIDER_SITE_OTHER): Payer: Medicare PPO

## 2020-07-07 VITALS — BP 110/72 | HR 69 | Ht 63.0 in | Wt 210.6 lb

## 2020-07-07 DIAGNOSIS — I480 Paroxysmal atrial fibrillation: Secondary | ICD-10-CM

## 2020-07-07 DIAGNOSIS — E785 Hyperlipidemia, unspecified: Secondary | ICD-10-CM

## 2020-07-07 DIAGNOSIS — Z79899 Other long term (current) drug therapy: Secondary | ICD-10-CM

## 2020-07-07 DIAGNOSIS — E119 Type 2 diabetes mellitus without complications: Secondary | ICD-10-CM

## 2020-07-07 DIAGNOSIS — Z7901 Long term (current) use of anticoagulants: Secondary | ICD-10-CM

## 2020-07-07 DIAGNOSIS — I10 Essential (primary) hypertension: Secondary | ICD-10-CM | POA: Diagnosis not present

## 2020-07-07 DIAGNOSIS — Z5181 Encounter for therapeutic drug level monitoring: Secondary | ICD-10-CM

## 2020-07-07 DIAGNOSIS — Z950 Presence of cardiac pacemaker: Secondary | ICD-10-CM

## 2020-07-07 DIAGNOSIS — Z9889 Other specified postprocedural states: Secondary | ICD-10-CM

## 2020-07-07 LAB — POCT INR: INR: 2.4 (ref 2.0–3.0)

## 2020-07-07 MED ORDER — TRIAMTERENE-HCTZ 37.5-25 MG PO TABS: ORAL_TABLET | ORAL | 3 refills | Status: DC

## 2020-07-07 MED ORDER — SOTALOL HCL 80 MG PO TABS: 80.0000 mg | ORAL_TABLET | Freq: Two times a day (BID) | ORAL | 3 refills | Status: DC

## 2020-07-07 MED ORDER — RAMIPRIL 10 MG PO CAPS: 10.0000 mg | ORAL_CAPSULE | Freq: Every day | ORAL | 3 refills | Status: DC

## 2020-07-07 MED ORDER — ROSUVASTATIN CALCIUM 10 MG PO TABS: 10.0000 mg | ORAL_TABLET | Freq: Every day | ORAL | 3 refills | Status: DC

## 2020-07-07 NOTE — Progress Notes (Signed)
Cardiology Office Note:    Date:  07/09/2020   ID:  [name], DOB 22-Mar-1945, MRN [id number]  PCP:  Suella Broad, FNP   [facility] Providers Cardiologist:  Sanda Klein, MD {   Referring MD: Suella Broad   Chief Complaint  Patient presents with   Follow-up    Seen for Dr. Sallyanne Kuster    History of Present Illness:    [name] is a 75 y.o. female with a hx of severe MR s/p mitral valve annuloplasty 2006, sinus node dysfunction and high-grade AV block s/p PPM, paroxysmal atrial fibrillation on Coumadin s/p left atrial appendage ligation 2006 at the time of mitral valve annuloplasty, paroxysmal atrial tachycardia, hypertension, hyperlipidemia, DM 2 and hypothyroidism.  Cardiac catheterization prior to annuloplasty in 2006 showed no coronary artery disease. Original Medtronic pacemaker was placed in 2006.  Right atrial lead is transvenous lead wires the ventricular lead is a active-fixation epicardial lead on the surface of the left ventricle placed at the time of her surgery.  She underwent a generator change in 2014.  Echocardiogram in September 2017 showed EF 45 to 50%, the drop in ejection fraction was felt to be in part secondary to pacing induced systolic dyssynchrony.  There was also elevated filling pressure due to grade 2 diastolic dysfunction.  Her last follow-up with Dr. Sallyanne Kuster was in October 2020 at which time she was doing well.  Most recent device interrogation was in April 2022, battery status is good, patient is pacemaker dependent, A. fib burden is about 6%.  Patient presents today for follow-up.  She has been doing well since the last visit.  She denies any chest discomfort or worsening shortness of breath.  She does not seems to have significant cardiac awareness of atrial fibrillation.  On exam, she has no lower extremity edema, orthopnea or PND.  She can follow-up with Dr. Sallyanne Kuster in 6 months.  She is due for remote transmission next  month.   Past Medical History:  Diagnosis Date   Arthritis    Atrial fibrillation [facility])    PAF---currently anticoagulated with Warfarin   Atrial flutter ([place]) 02/21/2004   s/p mitral valve repair and ppm insertion   Congenital heart block 08/26/2003   Diagnosed by Dr. Clovis Fredrickson. A temp orary pacemaker was placed in 1988 during hysterectomy procedure.   Diabetes mellitus without complication ([place])    prediabetic   Diverticulosis    Dyslipidemia    takes Niacin and Crestor daily   History of blood transfusion    no abnormal reaction noted   Hypothyroidism    takes Synthroid daily   Mitral valve insufficiency    Recieved Mitral valve repair with reconstruction of the anterior leaflet cordis with cortex and ring annuloplasty   Nonischemic cardiomyopathy ([place]) 2006   EF of 35% due to AV dyssyncrony. Has since resolved per most recent ECHO.   RBBB    Due the VP with the LV epicardial lead that was inserted in 2006 during the mitral repair   Systemic hypertension    takes  Ramipril daily-Maxzide every other day   Urinary urgency     Past Surgical History:  Procedure Laterality Date   ABDOMINAL HYSTERECTOMY     BREAST LUMPECTOMY WITH RADIOACTIVE SEED LOCALIZATION Right 11/22/2014   Procedure: RIGHT BREAST LUMPECTOMY WITH RADIOACTIVE SEED LOCALIZATION;  Surgeon: Fanny Skates, MD;  Location: [place];  Service: General;  Laterality: Right;   CARDIAC CATHETERIZATION  10/20/2003   Minimal decrease in  the LV systolic EF of 85-63% with global hyperkinesis. Moderately dilated LV. 3+ Mitral regurgitation. Underfilling of LAD which promptly improved with intracoronary NTG administration. Microvascular angina or microvascular spasm. Otherwise coronaries were normal. No intervention necessary.   CARDIOVASCULAR STRESS TEST  09/14/2003   Performed for the pt's congenital CHB that was discovered during a preoperative evaluation for a colonoscopy.  EKG demonstrated CHB with junctional escape.  Nonspecific ST changes noted. Stress ECG was positive for ischemia(2 mm ST depression). Dilated LV cavity. Inability to calculate systolic function. High risk scan. This scan was followed up with a cardiac cath.   COLONOSCOPY     MITRAL VALVE ANNULOPLASTY  02/06/2004   Performed for severe MR requiring mitral valve repair with anterior leaflet reconstruction and a [place] annuloplasty ring--model: [id number], and an 80% lesion reduction (Dr. Servando Snare). Two LV leads were placed epicardially just in the event that biv pacing would be needed. (One of the leads had been used for during the ppm insertion recieved one week later)    PERMANENT PACEMAKER GENERATOR CHANGE  07/06/2012   Medtronic   PERMANENT PACEMAKER GENERATOR CHANGE N/A 07/06/2012   Procedure: PERMANENT PACEMAKER GENERATOR CHANGE;  Surgeon: Sanda Klein, MD;  Location: [place] [facility];  Service: Cardiovascular;  Laterality: N/A;   PERMANENT PACEMAKER INSERTION  02/13/2004   PPM implant following a mitral valve annuloplasty. An epicardial active fixation lead (implanted on 02/06/2004 during the mitral valve procedure) was used for this implant.    US ECHOCARDIOGRAPHY  09/10/2010 and1/07/2008   The last echo showed an EF of >55%; RV mildly dilated. Mild to moderately dilated LA  with borderline RA dilation as well. Impaired diastolic function. Normal RV systolic function.MVR without residual insufficiency.No Pericardial effusion.    Current Medications: Current Meds  Medication Sig   acetaminophen (TYLENOL) 325 MG tablet Take 650 mg by mouth as needed for pain.   cholecalciferol (VITAMIN D) 400 UNITS TABS Take 400 Units by mouth daily.   folic acid (FOLVITE) 1 MG tablet Take 1 tablet (1 mg total) by mouth daily.   HYDROcodone-acetaminophen (NORCO) 5-325 MG tablet Take 1-2 tablets by mouth every 6 (six) hours as needed for moderate pain or severe pain.   levothyroxine (SYNTHROID) 75 MCG tablet Take 1 tablet (75 mcg total) by mouth  daily before breakfast.   niacin 500 MG tablet Take 500 mg by mouth every evening.    Omega-3 Fatty Acids (OMEGA 3 PO) Take 1,000 mg by mouth daily.   warfarin (COUMADIN) 5 MG tablet TAKE 1 TO 1 AND 1/2 TABLETS BY MOUTH EVERY DAY AS DIRECTED BY COUMADIN CLINIC   [DISCONTINUED] ramipril (ALTACE) 10 MG capsule TAKE 1 CAPSULE(10 MG) BY MOUTH AT BEDTIME   [DISCONTINUED] rosuvastatin (CRESTOR) 10 MG tablet Take 1 tablet (10 mg total) by mouth daily.   [DISCONTINUED] sotalol (BETAPACE) 80 MG tablet TAKE 1 TABLET(80 MG) BY MOUTH TWICE DAILY   [DISCONTINUED] triamterene-hydrochlorothiazide (MAXZIDE-25) 37.5-25 MG tablet TAKE 1/2 TABLET BY MOUTH EVERY DAY AS DIRECTED     Allergies:   Patient has no known allergies.   Social History   Socioeconomic History   Marital status: Single    Spouse name: Not on file   Number of children: Not on file   Years of education: Not on file   Highest education level: Not on file  Occupational History   Not on file  Tobacco Use   Smoking status: Former    Pack years: 0.00    Types:  Cigarettes   Smokeless tobacco: Never   Tobacco comments:    quit smoking in 2006  Substance and Sexual Activity   Alcohol use: Yes    Comment: wine occasionally   Drug use: No   Sexual activity: Yes    Birth control/protection: Surgical  Other Topics Concern   Not on file  Social History Narrative   Not on file   Social Determinants of Health   Financial Resource Strain: Not on file  Food Insecurity: Not on file  Transportation Needs: Not on file  Physical Activity: Not on file  Stress: Not on file  Social Connections: Not on file     Family History: The patient's family history includes Asthma in her brother and sister; Cancer - Other in her mother; Healthy in her brother, brother, brother, and brother; Pneumonia in her father.  ROS:   Please see the history of present illness.     All other systems reviewed and are negative.  EKGs/Labs/Other Studies  Reviewed:    The following studies were reviewed today:  Echo 10/05/2015 LV EF: 45% -   50%  Study Conclusions   - Left ventricle: The cavity size was normal. Wall thickness was    increased in a pattern of mild LVH. Systolic function was mildly    reduced. The estimated ejection fraction was in the range of 45%    to 50%. Diffuse hypokinesis with septal-lateral dyssynchrony.    Features are consistent with a pseudonormal left ventricular    filling pattern, with concomitant abnormal relaxation and    increased filling pressure (grade 2 diastolic dysfunction).  - Aortic valve: There was no stenosis. There was trivial    regurgitation.  - Mitral valve: Status post mitral valve repair. No significant    stenosis. There was trivial regurgitation. Pressure half-time: 45    ms. Mean gradient (D): 2 mm Hg.  - Left atrium: The atrium was mildly dilated.  - Right ventricle: The cavity size was mildly dilated. Pacer wire    or catheter noted in right ventricle. Systolic function was    mildly reduced.  - Tricuspid valve: Peak RV-RA gradient (S): 19 mm Hg.  - Pulmonary arteries: PA peak pressure: 22 mm Hg (S).  - Inferior vena cava: The vessel was normal in size. The    respirophasic diameter changes were in the normal range (= 50%),    consistent with normal central venous pressure.   Impressions:   - Normal LV size with mild LV hypertrophy. EF 45-50% with    septal-lateral dyssynchrony. Diffuse hypokinesis. Mildly dilated    RV with mildly decreased systolic function. S/p mitral valve    repair with no significant stenosis and trivial regurgitation.   EKG:  EKG is ordered today.  The ekg ordered today demonstrates atrial sensed ventricularly paced rhythm.  Recent Labs: No results found for requested labs within last 8760 hours.  Recent Lipid Panel    Component Value Date/Time   CHOL 164 01/11/2019 1046   TRIG 76 01/11/2019 1046   HDL 50 01/11/2019 1046   CHOLHDL 3.3 01/11/2019  1046   CHOLHDL 3.0 11/08/2015 1401   VLDL 18 11/08/2015 1401   LDLCALC 99 01/11/2019 1046     Risk Assessment/Calculations:    CHA2DS2-VASc Score = 5  This indicates a 7.2% annual risk of stroke. The patient's score is based upon: CHF History: Yes HTN History: Yes Diabetes History: Yes Stroke History: No Vascular Disease History: No Age Score: 1 Gender Score: 1  Physical Exam:    VS:  BP 110/72   Pulse 69   Ht 5\' 3"  (1.6 m)   Wt 210 lb 9.6 oz (95.5 kg)   BMI 37.31 kg/m     Wt Readings from Last 3 Encounters:  07/07/20 210 lb 9.6 oz (95.5 kg)  11/20/18 207 lb (93.9 kg)  02/19/17 207 lb 3.2 oz (94 kg)     GEN:  Well nourished, well developed in no acute distress HEENT: Normal NECK: No JVD; No carotid bruits LYMPHATICS: No lymphadenopathy CARDIAC: RRR, no murmurs, rubs, gallops RESPIRATORY:  Clear to auscultation without rales, wheezing or rhonchi  ABDOMEN: Soft, non-tender, non-distended MUSCULOSKELETAL:  No edema; No deformity  SKIN: Warm and dry NEUROLOGIC:  Alert and oriented x 3 PSYCHIATRIC:  Normal affect   ASSESSMENT:    1. PAF (paroxysmal atrial fibrillation) ([place])   2. Medication management   3. Hyperlipidemia, unspecified hyperlipidemia type   4. Primary hypertension   5. Controlled type 2 diabetes mellitus without complication, without long-term current use of insulin (HCC)   6. Status post mitral valve annuloplasty   7. Pacemaker    PLAN:    In order of problems listed above:  PAF: Last device interrogation revealed A. fib burden 6%.  Patient has been compliant with Coumadin therapy.  History of left atrial appendage ligation at the time of mitral valve annuloplasty.  Obtain CBC  Hyperlipidemia: Continue Crestor.  Due for repeat lab works.  Complete metabolic panel, fasting lipid panel.  Hypertension: Blood pressure well controlled on ramipril  DM2: Managed by primary care provider  History of mitral valve annuloplasty: Occurred in  2006.  Stable on last echocardiogram in 2017  History of pacemaker: Due for remote transmission next month.        Medication Adjustments/Labs and Tests Ordered: Current medicines are reviewed at length with the patient today.  Concerns regarding medicines are outlined above.  Orders Placed This Encounter  Procedures   Comprehensive metabolic panel   Lipid panel   CBC   EKG 12-Lead   Meds ordered this encounter  Medications   ramipril (ALTACE) 10 MG capsule    Sig: Take 1 capsule (10 mg total) by mouth at bedtime.    Dispense:  90 capsule    Refill:  3   rosuvastatin (CRESTOR) 10 MG tablet    Sig: Take 1 tablet (10 mg total) by mouth daily.    Dispense:  90 tablet    Refill:  3    CP 5048  Exp 7/17   sotalol (BETAPACE) 80 MG tablet    Sig: Take 1 tablet (80 mg total) by mouth 2 (two) times daily. TAKE 1 TABLET(80 MG) BY MOUTH TWICE DAILY    Dispense:  180 tablet    Refill:  3   triamterene-hydrochlorothiazide (MAXZIDE-25) 37.5-25 MG tablet    Sig: TAKE 1/2 TABLET BY MOUTH EVERY DAY AS DIRECTED    Dispense:  45 tablet    Refill:  3    Patient Instructions  Medication Instructions:  Your physician recommends that you continue on your current medications as directed. Please refer to the Current Medication list given to you today.  *If you need a refill on your cardiac medications before your next appointment, please call your pharmacy*  Lab Work: Your physician recommends that you return for lab work NO LATER THAN A WEEK:  CBC CMP Fasting Lipid Panel-DO NOT EAT OR DRINK PAST MIDNIGHT. OKAY TO HAVE WATER.   If you have labs (  blood work) drawn today and your tests are completely normal, you will receive your results only by: [place] (if you have MyChart) OR A paper copy in the mail If you have any lab test that is abnormal or we need to change your treatment, we will call you to review the results.  Testing/Procedures: NONE ordered at this time of  appointment   Follow-Up: At [facility], you and your health needs are our priority.  As part of our continuing mission to provide you with exceptional heart care, we have created designated Provider Care Teams.  These Care Teams include your primary Cardiologist (physician) and Advanced Practice Providers (APPs -  Physician Assistants and Nurse Practitioners) who all work together to provide you with the care you need, when you need it.  Your next appointment:   6 month(s)  The format for your next appointment:   In Person  Provider:   Sanda Klein, MD  Other Instructions    Signed, Almyra Deforest, [place]  07/09/2020 10:51 PM    [place]

## 2020-07-07 NOTE — Patient Instructions (Signed)
Medication Instructions:  Your physician recommends that you continue on your current medications as directed. Please refer to the Current Medication list given to you today.  *If you need a refill on your cardiac medications before your next appointment, please call your pharmacy*  Lab Work: Your physician recommends that you return for lab work NO LATER THAN A WEEK:  CBC CMP Fasting Lipid Panel-DO NOT EAT OR DRINK PAST MIDNIGHT. OKAY TO HAVE WATER.   If you have labs (blood work) drawn today and your tests are completely normal, you will receive your results only by: [place] (if you have MyChart) OR A paper copy in the mail If you have any lab test that is abnormal or we need to change your treatment, we will call you to review the results.  Testing/Procedures: NONE ordered at this time of appointment   Follow-Up: At [facility], you and your health needs are our priority.  As part of our continuing mission to provide you with exceptional heart care, we have created designated Provider Care Teams.  These Care Teams include your primary Cardiologist (physician) and Advanced Practice Providers (APPs -  Physician Assistants and Nurse Practitioners) who all work together to provide you with the care you need, when you need it.  Your next appointment:   6 month(s)  The format for your next appointment:   In Person  Provider:   Sanda Klein, MD  Other Instructions

## 2020-07-07 NOTE — Patient Instructions (Signed)
Continue taking 1 tablet daily except for 1.5 tablets  each Sunday, Tuesday and Thursday. Repeat INR in 6 weeks.

## 2020-07-09 ENCOUNTER — Encounter: Payer: Self-pay | Admitting: Physician Assistant

## 2020-07-11 LAB — CBC
Hematocrit: 41.7 % (ref 34.0–46.6)
Hemoglobin: 13.4 g/dL (ref 11.1–15.9)
MCH: 27.6 pg (ref 26.6–33.0)
MCHC: 32.1 g/dL (ref 31.5–35.7)
MCV: 86 fL (ref 79–97)
Platelets: 208 10*3/uL (ref 150–450)
RBC: 4.85 x10E6/uL (ref 3.77–5.28)
RDW: 12.8 % (ref 11.7–15.4)
WBC: 3.8 10*3/uL (ref 3.4–10.8)

## 2020-07-11 LAB — COMPREHENSIVE METABOLIC PANEL
ALT: 16 IU/L (ref 0–32)
AST: 17 IU/L (ref 0–40)
Albumin/Globulin Ratio: 1.6 (ref 1.2–2.2)
Albumin: 4.1 g/dL (ref 3.7–4.7)
Alkaline Phosphatase: 64 IU/L (ref 44–121)
BUN/Creatinine Ratio: 17 (ref 12–28)
BUN: 15 mg/dL (ref 8–27)
Bilirubin Total: 0.5 mg/dL (ref 0.0–1.2)
CO2: 26 mmol/L (ref 20–29)
Calcium: 9.4 mg/dL (ref 8.7–10.3)
Chloride: 104 mmol/L (ref 96–106)
Creatinine, Ser: 0.9 mg/dL (ref 0.57–1.00)
Globulin, Total: 2.6 g/dL (ref 1.5–4.5)
Glucose: 90 mg/dL (ref 65–99)
Potassium: 4.4 mmol/L (ref 3.5–5.2)
Sodium: 142 mmol/L (ref 134–144)
Total Protein: 6.7 g/dL (ref 6.0–8.5)
eGFR: 68 mL/min/{1.73_m2} (ref >59)

## 2020-07-11 LAB — LIPID PANEL
Chol/HDL Ratio: 3.1 ratio (ref 0.0–4.4)
Cholesterol, Total: 156 mg/dL (ref 100–199)
HDL: 50 mg/dL (ref >39)
LDL Chol Calc (NIH): 89 mg/dL (ref 0–99)
Triglycerides: 94 mg/dL (ref 0–149)
VLDL Cholesterol Cal: 17 mg/dL (ref 5–40)

## 2020-07-12 ENCOUNTER — Other Ambulatory Visit: Payer: Self-pay | Admitting: Physician Assistant

## 2020-07-17 NOTE — Progress Notes (Signed)
Stable renal function and electrolyte, normal red blood cell count, cholesterol well controlled.

## 2020-08-08 ENCOUNTER — Other Ambulatory Visit: Payer: Self-pay | Admitting: Cardiovascular Disease

## 2020-08-18 ENCOUNTER — Other Ambulatory Visit: Payer: Self-pay

## 2020-08-18 ENCOUNTER — Ambulatory Visit (INDEPENDENT_AMBULATORY_CARE_PROVIDER_SITE_OTHER): Payer: Medicare PPO

## 2020-08-18 DIAGNOSIS — Z5181 Encounter for therapeutic drug level monitoring: Secondary | ICD-10-CM

## 2020-08-18 DIAGNOSIS — I480 Paroxysmal atrial fibrillation: Secondary | ICD-10-CM

## 2020-08-18 DIAGNOSIS — Z7901 Long term (current) use of anticoagulants: Secondary | ICD-10-CM

## 2020-08-18 LAB — POCT INR: INR: 3.8 — AB (ref 2.0–3.0)

## 2020-08-18 NOTE — Patient Instructions (Signed)
Hold tomorrow only and then Continue taking 1 tablet daily except for 1.5 tablets  each Sunday, Tuesday and Thursday. Repeat INR in 3 weeks

## 2020-08-29 ENCOUNTER — Other Ambulatory Visit: Payer: Self-pay | Admitting: Cardiovascular Disease

## 2020-09-08 ENCOUNTER — Ambulatory Visit (INDEPENDENT_AMBULATORY_CARE_PROVIDER_SITE_OTHER): Payer: Medicare PPO

## 2020-09-08 ENCOUNTER — Other Ambulatory Visit: Payer: Self-pay

## 2020-09-08 DIAGNOSIS — Z5181 Encounter for therapeutic drug level monitoring: Secondary | ICD-10-CM

## 2020-09-08 DIAGNOSIS — Z7901 Long term (current) use of anticoagulants: Secondary | ICD-10-CM | POA: Diagnosis not present

## 2020-09-08 DIAGNOSIS — I480 Paroxysmal atrial fibrillation: Secondary | ICD-10-CM | POA: Diagnosis not present

## 2020-09-08 LAB — POCT INR: INR: 2.5 (ref 2.0–3.0)

## 2020-09-08 NOTE — Patient Instructions (Signed)
Continue taking 1 tablet daily except for 1.5 tablets  each Sunday, Tuesday and Thursday. Repeat INR in 6 weeks.

## 2020-10-18 ENCOUNTER — Ambulatory Visit (INDEPENDENT_AMBULATORY_CARE_PROVIDER_SITE_OTHER): Payer: Medicare PPO

## 2020-10-18 ENCOUNTER — Other Ambulatory Visit: Payer: Self-pay

## 2020-10-18 DIAGNOSIS — I480 Paroxysmal atrial fibrillation: Secondary | ICD-10-CM | POA: Diagnosis not present

## 2020-10-18 DIAGNOSIS — Z7901 Long term (current) use of anticoagulants: Secondary | ICD-10-CM

## 2020-10-18 DIAGNOSIS — Z5181 Encounter for therapeutic drug level monitoring: Secondary | ICD-10-CM | POA: Diagnosis not present

## 2020-10-18 LAB — POCT INR: INR: 3.9 — AB (ref 2.0–3.0)

## 2020-10-18 NOTE — Patient Instructions (Signed)
Hold tomorrow only and then Continue taking 1 tablet daily except for 1.5 tablets  each Sunday, Tuesday and Thursday. Repeat INR in 3 weeks.

## 2020-11-08 ENCOUNTER — Other Ambulatory Visit: Payer: Self-pay

## 2020-11-08 ENCOUNTER — Ambulatory Visit (INDEPENDENT_AMBULATORY_CARE_PROVIDER_SITE_OTHER): Payer: Medicare PPO

## 2020-11-08 DIAGNOSIS — Z7901 Long term (current) use of anticoagulants: Secondary | ICD-10-CM

## 2020-11-08 DIAGNOSIS — Z5181 Encounter for therapeutic drug level monitoring: Secondary | ICD-10-CM

## 2020-11-08 DIAGNOSIS — I480 Paroxysmal atrial fibrillation: Secondary | ICD-10-CM

## 2020-11-08 LAB — POCT INR: INR: 2.3 (ref 2.0–3.0)

## 2020-11-08 NOTE — Patient Instructions (Signed)
Continue taking 1 tablet daily except for 1.5 tablets  each Sunday, Tuesday and Thursday. Repeat INR in 8 weeks. [phone]

## 2020-11-13 ENCOUNTER — Ambulatory Visit (INDEPENDENT_AMBULATORY_CARE_PROVIDER_SITE_OTHER): Payer: Medicare PPO

## 2020-11-13 DIAGNOSIS — I442 Atrioventricular block, complete: Secondary | ICD-10-CM | POA: Diagnosis not present

## 2020-11-14 LAB — CUP PACEART REMOTE DEVICE CHECK
Battery Impedance: 1636 Ohm
Battery Remaining Longevity: 34 mo
Battery Voltage: 2.76 V
Brady Statistic AP VP Percent: 27 %
Brady Statistic AP VS Percent: 0 %
Brady Statistic AS VP Percent: 73 %
Brady Statistic AS VS Percent: 0 %
Date Time Interrogation Session: 20221017080008
Implantable Lead Implant Date: 20060109
Implantable Lead Implant Date: 20060116
Implantable Lead Location: 753858
Implantable Lead Location: 753859
Implantable Lead Model: 5071
Implantable Lead Model: 5076
Implantable Pulse Generator Implant Date: 20140609
Lead Channel Impedance Value: 411 Ohm
Lead Channel Impedance Value: 458 Ohm
Lead Channel Pacing Threshold Amplitude: 1.125 V
Lead Channel Pacing Threshold Amplitude: 1.375 V
Lead Channel Pacing Threshold Pulse Width: 0.4 ms
Lead Channel Pacing Threshold Pulse Width: 0.4 ms
Lead Channel Setting Pacing Amplitude: 2.25 V
Lead Channel Setting Pacing Amplitude: 2.75 V
Lead Channel Setting Pacing Pulse Width: 0.4 ms
Lead Channel Setting Sensing Sensitivity: 4 mV

## 2020-11-21 NOTE — Progress Notes (Signed)
Remote pacemaker transmission.   

## 2020-11-30 ENCOUNTER — Other Ambulatory Visit: Payer: Self-pay

## 2020-11-30 MED ORDER — FOLIC ACID 1 MG PO TABS: 1.0000 mg | ORAL_TABLET | Freq: Every day | ORAL | 3 refills | Status: DC

## 2021-01-02 ENCOUNTER — Ambulatory Visit (INDEPENDENT_AMBULATORY_CARE_PROVIDER_SITE_OTHER): Payer: Medicare PPO | Admitting: *Deleted

## 2021-01-02 ENCOUNTER — Encounter: Payer: Self-pay | Admitting: Cardiovascular Disease

## 2021-01-02 ENCOUNTER — Ambulatory Visit (INDEPENDENT_AMBULATORY_CARE_PROVIDER_SITE_OTHER): Payer: Medicare PPO | Admitting: Cardiovascular Disease

## 2021-01-02 ENCOUNTER — Other Ambulatory Visit: Payer: Self-pay

## 2021-01-02 VITALS — BP 140/80 | HR 68 | Ht 63.0 in | Wt 208.5 lb

## 2021-01-02 DIAGNOSIS — Z79899 Other long term (current) drug therapy: Secondary | ICD-10-CM

## 2021-01-02 DIAGNOSIS — Z7901 Long term (current) use of anticoagulants: Secondary | ICD-10-CM

## 2021-01-02 DIAGNOSIS — Z9889 Other specified postprocedural states: Secondary | ICD-10-CM

## 2021-01-02 DIAGNOSIS — I480 Paroxysmal atrial fibrillation: Secondary | ICD-10-CM

## 2021-01-02 DIAGNOSIS — I10 Essential (primary) hypertension: Secondary | ICD-10-CM

## 2021-01-02 DIAGNOSIS — I5042 Chronic combined systolic (congestive) and diastolic (congestive) heart failure: Secondary | ICD-10-CM | POA: Diagnosis not present

## 2021-01-02 DIAGNOSIS — Z950 Presence of cardiac pacemaker: Secondary | ICD-10-CM

## 2021-01-02 DIAGNOSIS — Z5181 Encounter for therapeutic drug level monitoring: Secondary | ICD-10-CM

## 2021-01-02 DIAGNOSIS — I442 Atrioventricular block, complete: Secondary | ICD-10-CM | POA: Diagnosis not present

## 2021-01-02 LAB — POCT INR: INR: 2 (ref 2.0–3.0)

## 2021-01-02 NOTE — Patient Instructions (Signed)
Description   Take 2 tablets (total) of warfarin for today, then continue taking 1 tablet daily except for 1.5 tablets  each Sunday, Tuesday and Thursday. Repeat INR in 6 weeks. [phone]

## 2021-01-02 NOTE — Progress Notes (Signed)
Patient ID: [name], female   DOB: 1945/02/17, 75 y.o.   MRN: [id number]    Cardiology Office Note    Date:  01/02/2021   ID:  CHIARA COLTRIN, DOB Mar 10, 1945, MRN [id number]  PCP:  Suella Broad, FNP  Cardiologist:   Sanda Klein, MD   Chief Complaint  Patient presents with   Atrial Fibrillation    History of Present Illness:  NERIDA BOIVIN is a 75 y.o. female with a history of previous mitral valve annuloplasty repair, sinus node dysfunction high-grade AV block requiring pacemaker implantation, paroxysmal atrial fibrillation on chronic warfarin anticoagulation.  She is doing quite well and has no major cardiovascular complaints.  She reports some difficulty climbing stairs, but can walk on flat land at the usual pace for an hour before becoming short of breath.  She has occasional mild palpitations.  The patient specifically denies any chest pain at rest or with exertion, dyspnea at rest or with exertion, orthopnea, paroxysmal nocturnal dyspnea, syncope,  focal neurological deficits, intermittent claudication, lower extremity edema, unexplained weight gain, cough, hemoptysis or wheezing.  She has not had any falls or bleeding problems.  She has noticed that her blood pressure is running a little high, usually in the upper 130s/upper 80s recently.  She has not had any values over 140/90.  She has no symptoms related to the blood pressure.  Presenting rhythm today is atrial sensed ventricular paced (sinus mechanism).  The QRS is very broad at 180 ms with positive R wave across the precordium consistent with her epicardial left ventricular lead.  The QTC is commensurately prolonged at 548 ms.  She is on sotalol and her typical QTC interval has been around 540 ms.  Interrogation of her pacemaker shows normal device function.  She has a dual-chamber Medtronic Adapta device was implanted in 2014 and still has an estimated 3 years of generator longevity.  Her leads were implanted  in 2006.  She has a Medtronic 5076 transvenous atrial lead and a Medtronic 5071 epicardial left ventricular lead placed at the time of surgery.  She only has 27% atrial pacing and her heart rate histogram distribution appears appropriate.  She has 99.9% ventricular pacing (dependent).  She has had occasional episodes of nonsustained ventricular tachycardia (13 events in the last more than 2 years).  The most recent 1 occurred on 10/31/2020 and like previous episodes was brief, around 5 seconds with a rate of 160 bpm.  The VT has not been symptomatic.  Her burden of atrial fibrillation remains stable at about 6%.  She had a longer episode in September lasting for about 2-1/2 days, most of the events last for just a few hours.  They are all asymptomatic.  Occasionally she will have lengthy episodes of sustained paroxysmal atrial tachycardia, possibly atypical atrial flutter.   Echo September 2017 shows mildly depressed left ventricular systolic function with EF of 45-50%, at least in part secondary to pacing induced systolic dyssynchrony. There was echo evidence of elevated filling pressures due to grade 2 diastolic dysfunction. The left atrium is mildly dilated with end systolic diameter of 41 mm. there was no evidence of mitral insufficiency and her annuloplasty was not associated with significantly increased mitral gradients.  Mrs. Montesinos had mitral valve annuloplasty for severe mitral insufficiency in 2006.  The left atrial appendage was ligated during that procedure.  She did not have coronary disease by preoperative angiography and has normal left ventricular systolic function. Followup echocardiography 2015 and 2017  shows borderline low left ventricular ejection fraction (45-50%). Valve repair looks good. She had a dual chamber permanent pacemaker (Medtronic) implanted in 2006 and has complete heart block . The right atrial lead is a conventional transvenous lead. The ventricular lead is an active  fixation epicardial lead on the surface of the left ventricle placed at the time of her surgery. A generator change was performed in 2014. Her pacemaker is located in a deep subpectoral pocket. She has paroxysmal atrial fibrillation. She has treated hypertension, dyslipidemia and type 2 diabetes mellitus on the background of severe obesity.   Past Medical History:  Diagnosis Date   Arthritis    Atrial fibrillation [facility])    PAF---currently anticoagulated with Warfarin   Atrial flutter ([place]) 02/21/2004   s/p mitral valve repair and ppm insertion   Congenital heart block 08/26/2003   Diagnosed by Dr. Clovis Fredrickson. A temp orary pacemaker was placed in 1988 during hysterectomy procedure.   Diabetes mellitus without complication ([place])    prediabetic   Diverticulosis    Dyslipidemia    takes Niacin and Crestor daily   History of blood transfusion    no abnormal reaction noted   Hypothyroidism    takes Synthroid daily   Mitral valve insufficiency    Recieved Mitral valve repair with reconstruction of the anterior leaflet cordis with cortex and ring annuloplasty   Nonischemic cardiomyopathy ([place]) 2006   EF of 35% due to AV dyssyncrony. Has since resolved per most recent ECHO.   RBBB    Due the VP with the LV epicardial lead that was inserted in 2006 during the mitral repair   Systemic hypertension    takes  Ramipril daily-Maxzide every other day   Urinary urgency     Past Surgical History:  Procedure Laterality Date   ABDOMINAL HYSTERECTOMY     BREAST LUMPECTOMY WITH RADIOACTIVE SEED LOCALIZATION Right 11/22/2014   Procedure: RIGHT BREAST LUMPECTOMY WITH RADIOACTIVE SEED LOCALIZATION;  Surgeon: Fanny Skates, MD;  Location: [facility];  Service: General;  Laterality: Right;   CARDIAC CATHETERIZATION  10/20/2003   Minimal decrease in the LV systolic EF of 85-02% with global hyperkinesis. Moderately dilated LV. 3+ Mitral regurgitation. Underfilling of LAD which promptly improved with  intracoronary NTG administration. Microvascular angina or microvascular spasm. Otherwise coronaries were normal. No intervention necessary.   CARDIOVASCULAR STRESS TEST  09/14/2003   Performed for the pt's congenital CHB that was discovered during a preoperative evaluation for a colonoscopy.  EKG demonstrated CHB with junctional escape. Nonspecific ST changes noted. Stress ECG was positive for ischemia(2 mm ST depression). Dilated LV cavity. Inability to calculate systolic function. High risk scan. This scan was followed up with a cardiac cath.   COLONOSCOPY     MITRAL VALVE ANNULOPLASTY  02/06/2004   Performed for severe MR requiring mitral valve repair with anterior leaflet reconstruction and a [place] annuloplasty ring--model: [id number], and an 80% lesion reduction (Dr. Servando Snare). Two LV leads were placed epicardially just in the event that biv pacing would be needed. (One of the leads had been used for during the ppm insertion recieved one week later)    PERMANENT PACEMAKER GENERATOR CHANGE  07/06/2012   Medtronic   PERMANENT PACEMAKER GENERATOR CHANGE N/A 07/06/2012   Procedure: PERMANENT PACEMAKER GENERATOR CHANGE;  Surgeon: Sanda Klein, MD;  Location: [facility] CATH LAB;  Service: Cardiovascular;  Laterality: N/A;   PERMANENT PACEMAKER INSERTION  02/13/2004   PPM implant following a mitral valve annuloplasty. An epicardial active  fixation lead (implanted on 02/06/2004 during the mitral valve procedure) was used for this implant.    US ECHOCARDIOGRAPHY  09/10/2010 and1/07/2008   The last echo showed an EF of >55%; RV mildly dilated. Mild to moderately dilated LA  with borderline RA dilation as well. Impaired diastolic function. Normal RV systolic function.MVR without residual insufficiency.No Pericardial effusion.    Current Medications: Outpatient Medications Prior to Visit  Medication Sig Dispense Refill   acetaminophen (TYLENOL) 325 MG tablet Take 650 mg by mouth as needed for  pain.     cholecalciferol (VITAMIN D) 400 UNITS TABS Take 400 Units by mouth daily.     folic acid (FOLVITE) 1 MG tablet Take 1 tablet (1 mg total) by mouth daily. 90 tablet 3   levothyroxine (SYNTHROID) 75 MCG tablet Take 1 tablet (75 mcg total) by mouth daily before breakfast. 90 tablet 3   niacin 500 MG tablet Take 500 mg by mouth every evening.      Omega-3 Fatty Acids (OMEGA 3 PO) Take 1,000 mg by mouth daily.     ramipril (ALTACE) 10 MG capsule Take 1 capsule (10 mg total) by mouth at bedtime. 90 capsule 3   rosuvastatin (CRESTOR) 10 MG tablet Take 1 tablet (10 mg total) by mouth daily. 90 tablet 3   sotalol (BETAPACE) 80 MG tablet TAKE 1 TABLET(80 MG) BY MOUTH TWICE DAILY 180 tablet 3   triamterene-hydrochlorothiazide (MAXZIDE-25) 37.5-25 MG tablet TAKE 1/2 TABLET BY MOUTH EVERY DAY AS DIRECTED 45 tablet 3   warfarin (COUMADIN) 5 MG tablet TAKE 1 TO 1 AND 1/2 TABLETS BY MOUTH EVERY DAY AS DIRECTED BY COUMADIN CLINIC 135 tablet 1   HYDROcodone-acetaminophen (NORCO) 5-325 MG tablet Take 1-2 tablets by mouth every 6 (six) hours as needed for moderate pain or severe pain. (Patient not taking: Reported on 01/02/2021) 30 tablet 0   No facility-administered medications prior to visit.     Allergies:   Patient has no known allergies.   Social History   Socioeconomic History   Marital status: Single    Spouse name: Not on file   Number of children: Not on file   Years of education: Not on file   Highest education level: Not on file  Occupational History   Not on file  Tobacco Use   Smoking status: Former    Types: Cigarettes   Smokeless tobacco: Never   Tobacco comments:    quit smoking in 2006  Substance and Sexual Activity   Alcohol use: Yes    Comment: wine occasionally   Drug use: No   Sexual activity: Yes    Birth control/protection: Surgical  Other Topics Concern   Not on file  Social History Narrative   Not on file   Social Determinants of Health   Financial  Resource Strain: Not on file  Food Insecurity: Not on file  Transportation Needs: Not on file  Physical Activity: Not on file  Stress: Not on file  Social Connections: Not on file     Family History:  The patient's family history includes Asthma in her brother and sister; Cancer - Other in her mother; Healthy in her brother, brother, brother, and brother; Pneumonia in her father.   ROS:   Please see the history of present illness.    ROS All other systems are reviewed and are negative.   PHYSICAL EXAM:   VS:  BP 140/80 (BP Location: Right Arm)   Pulse 68   Ht 5\' 3"  (1.6 m)  Wt 208 lb 8 oz (94.6 kg)   SpO2 96%   BMI 36.93 kg/m      General: Alert, oriented x3, no distress, severely obese.  Healthy left subclavian pacemaker site. Head: no evidence of trauma, PERRL, EOMI, no exophtalmos or lid lag, no myxedema, no xanthelasma; normal ears, nose and oropharynx Neck: normal jugular venous pulsations and no hepatojugular reflux; brisk carotid pulses without delay and no carotid bruits Chest: clear to auscultation, no signs of consolidation by percussion or palpation, normal fremitus, symmetrical and full respiratory excursions Cardiovascular: normal position and quality of the apical impulse, regular rhythm, normal first and second heart sounds, no murmurs, rubs or gallops Abdomen: no tenderness or distention, no masses by palpation, no abnormal pulsatility or arterial bruits, normal bowel sounds, no hepatosplenomegaly Extremities: no clubbing, cyanosis or edema; 2+ radial, ulnar and brachial pulses bilaterally; 2+ right femoral, posterior tibial and dorsalis pedis pulses; 2+ left femoral, posterior tibial and dorsalis pedis pulses; no subclavian or femoral bruits Neurological: grossly nonfocal Psych: Normal mood and affect   Wt Readings from Last 3 Encounters:  01/02/21 208 lb 8 oz (94.6 kg)  07/07/20 210 lb 9.6 oz (95.5 kg)  11/20/18 207 lb (93.9 kg)      Studies/Labs  Reviewed:   EKG:  EKG is  ordered today.  Personally reviewed, it is very similar to previous tracings.  She has atrial sensed (sinus mechanism), ventricular paced rhythm with a very broad and tall R wave in lead V1.  QTc 548 ms.    Recent Labs: 07/11/2020: ALT 16; BUN 15; Creatinine, Ser 0.90; Hemoglobin 13.4; Platelets 208; Potassium 4.4; Sodium 142     ASSESSMENT:    1. PAF (paroxysmal atrial fibrillation) ([place])   2. Chronic combined systolic and diastolic heart failure ([place])   3. CHB (complete heart block) ([facility])   4. Pacemaker   5. History of mitral valve repair   6. Long term current use of anticoagulant therapy   7. Encounter for monitoring sotalol therapy   8. Essential hypertension   9. Severe obesity (BMI 35.0-39.9) with comorbidity ([place])      PLAN:  In order of problems listed above:  Afib: In addition to paroxysmal atrial fibrillation she sometimes has paroxysmal atrial tachycardia (probably left atrial flutter related to the scar over atriotomy).  The overall burden of arrhythmia is stable at about 6%.  The episodes of atrial tachycardia are not symptomatic, although they sometimes leads to persistently elevated ventricular rates.  She is therapeutically anticoagulated.  Valvular atrial fibrillation, CHA2DS2-VASc score is not relevant.  She has had her appendage clipped so interruption of anticoagulation should be relatively safe. CHF: She does not have symptoms of heart failure and appears clinically euvolemic without loop diuretics.  She has no symptoms of congestive heart failure and maintains euvolemia without loop diuretics.  Time to repeat her echocardiogram which has not been performed since 2017.  In the past LV EF 45-50%, some variation in evaluation, but direct image comparison shows little change. Preop EF was 40-45%, so current EF is expected. On ACE inhibitor and "beta blocker" (sotalol).  Appears clinically euvolemic, NYHA functional class II CHB: She is pacemaker  dependent. Her V pacing lead is an epicardial LV lead. Upgrade to CRT is therefore less likely to be of benefit, except that the QRS complex is remarkably broad. PM: Normal device function.  Continue remote downloads every 3 months and 55-month office visits for QT monitoring. MR s/p annuloplasty: she has had a  durable and good result from her annuloplasty.  Time to repeat her echocardiogram which has not been performed since 2017. Warfarin: She did have surgical closure of the left atrial appendage at the time of mitral surgery and only mildly data left atrium. CHADSVasc at least 41 (age, gender, LV dysfunction/CHF, hypertension), but is not truly relevant in the setting of valvular heart disease. Sotalol: QT is very broad but this is largely due to the very broad QRS at 180 ms.  Adjusting for this, her QT interval is acceptable and has been stable in the 548 ms range for years.  Reminded her about the potential for multiple drug-drug interactions with sotalol and warfarin. HTN: Mildly elevated.  She is on maximum dose ACE inhibitor.  Her sotalol does serve as a "beta-blocker".  In the past we have had to reduce her triamterene-hydrochlorothiazide to half of the current dose due to issues with low blood pressure.  I am reluctant to make any changes to her medications at this time.  We will tolerate systolic blood pressure up to 761 and diastolic blood pressure up to 90 mmHg.  She will keep an eye on her BP at home and call us if she exceeds those values. Obesity: Weight loss would probably help improve her blood pressure.   Medication Adjustments/Labs and Tests Ordered: Current medicines are reviewed at length with the patient today.  Concerns regarding medicines are outlined above.  Medication changes, Labs and Tests ordered today are listed in the Patient Instructions below. Patient Instructions  Medication Instructions:  No changes *If you need a refill on your cardiac medications before your next  appointment, please call your pharmacy*   Lab Work: None ordered If you have labs (blood work) drawn today and your tests are completely normal, you will receive your results only by: [place] (if you have MyChart) OR A paper copy in the mail If you have any lab test that is abnormal or we need to change your treatment, we will call you to review the results.   Testing/Procedures: Your physician has requested that you have an echocardiogram. Echocardiography is a painless test that uses sound waves to create images of your heart. It provides your doctor with information about the size and shape of your heart and how well your heart's chambers and valves are working. You may receive an ultrasound enhancing agent through an IV if needed to better visualize your heart during the echo.This procedure takes approximately one hour. There are no restrictions for this procedure. This will take place at the [address], [address].     Follow-Up: At [facility], you and your health needs are our priority.  As part of our continuing mission to provide you with exceptional heart care, we have created designated Provider Care Teams.  These Care Teams include your primary Cardiologist (physician) and Advanced Practice Providers (APPs -  Physician Assistants and Nurse Practitioners) who all work together to provide you with the care you need, when you need it.  We recommend signing up for the patient portal called "MyChart".  Sign up information is provided on this After Visit Summary.  MyChart is used to connect with patients for Virtual Visits (Telemedicine).  Patients are able to view lab/test results, encounter notes, upcoming appointments, etc.  Non-urgent messages can be sent to your provider as well.   To learn more about what you can do with MyChart, go to [email or web address].    Your next appointment:   6  months with Almyra Deforest, PA 12 months with Dr. Sallyanne Kuster    Signed, Sanda Klein, MD  01/02/2021 11:43 AM    [address], [place], [place]  [address] Phone: [phone]; Fax: [phone]

## 2021-01-02 NOTE — Patient Instructions (Addendum)
Medication Instructions:  No changes *If you need a refill on your cardiac medications before your next appointment, please call your pharmacy*   Lab Work: None ordered If you have labs (blood work) drawn today and your tests are completely normal, you will receive your results only by: [place] (if you have MyChart) OR A paper copy in the mail If you have any lab test that is abnormal or we need to change your treatment, we will call you to review the results.   Testing/Procedures: Your physician has requested that you have an echocardiogram. Echocardiography is a painless test that uses sound waves to create images of your heart. It provides your doctor with information about the size and shape of your heart and how well your heart's chambers and valves are working. You may receive an ultrasound enhancing agent through an IV if needed to better visualize your heart during the echo.This procedure takes approximately one hour. There are no restrictions for this procedure. This will take place at the [address].     Follow-Up: At [facility], you and your health needs are our priority.  As part of our continuing mission to provide you with exceptional heart care, we have created designated Provider Care Teams.  These Care Teams include your primary Cardiologist (physician) and Advanced Practice Providers (APPs -  Physician Assistants and Nurse Practitioners) who all work together to provide you with the care you need, when you need it.  We recommend signing up for the patient portal called "MyChart".  Sign up information is provided on this After Visit Summary.  MyChart is used to connect with patients for Virtual Visits (Telemedicine).  Patients are able to view lab/test results, encounter notes, upcoming appointments, etc.  Non-urgent messages can be sent to your provider as well.   To learn more about what you can do with MyChart, go to [email or web address].     Your next appointment:   6 months with Michelle Deforest, PA 12 months with [name]

## 2021-02-01 ENCOUNTER — Other Ambulatory Visit: Payer: Self-pay

## 2021-02-01 ENCOUNTER — Ambulatory Visit (HOSPITAL_COMMUNITY): Payer: Medicare PPO | Attending: Internal Medicine

## 2021-02-01 DIAGNOSIS — Z9889 Other specified postprocedural states: Secondary | ICD-10-CM

## 2021-02-01 DIAGNOSIS — I480 Paroxysmal atrial fibrillation: Secondary | ICD-10-CM

## 2021-02-01 LAB — ECHOCARDIOGRAM COMPLETE
Area-P 1/2: 2.66 cm2
MV VTI: 1.59 cm2
P 1/2 time: 442 msec
S' Lateral: 3.6 cm

## 2021-02-06 ENCOUNTER — Encounter: Payer: Self-pay | Admitting: *Deleted

## 2021-02-12 ENCOUNTER — Ambulatory Visit (INDEPENDENT_AMBULATORY_CARE_PROVIDER_SITE_OTHER): Payer: Medicare PPO

## 2021-02-12 DIAGNOSIS — I480 Paroxysmal atrial fibrillation: Secondary | ICD-10-CM

## 2021-02-12 LAB — CUP PACEART REMOTE DEVICE CHECK
Battery Impedance: 1814 Ohm
Battery Remaining Longevity: 34 mo
Battery Voltage: 2.76 V
Brady Statistic AP VP Percent: 28 %
Brady Statistic AP VS Percent: 0 %
Brady Statistic AS VP Percent: 71 %
Brady Statistic AS VS Percent: 0 %
Date Time Interrogation Session: 20230116073454
Implantable Lead Implant Date: 20060109
Implantable Lead Implant Date: 20060116
Implantable Lead Location: 753858
Implantable Lead Location: 753859
Implantable Lead Model: 5071
Implantable Lead Model: 5076
Implantable Pulse Generator Implant Date: 20140609
Lead Channel Impedance Value: 433 Ohm
Lead Channel Impedance Value: 468 Ohm
Lead Channel Pacing Threshold Amplitude: 1.125 V
Lead Channel Pacing Threshold Amplitude: 1.125 V
Lead Channel Pacing Threshold Pulse Width: 0.4 ms
Lead Channel Pacing Threshold Pulse Width: 0.4 ms
Lead Channel Setting Pacing Amplitude: 2.25 V
Lead Channel Setting Pacing Amplitude: 2.5 V
Lead Channel Setting Pacing Pulse Width: 0.4 ms
Lead Channel Setting Sensing Sensitivity: 4 mV

## 2021-02-13 ENCOUNTER — Ambulatory Visit (INDEPENDENT_AMBULATORY_CARE_PROVIDER_SITE_OTHER): Payer: Medicare PPO | Admitting: Pharmacist

## 2021-02-13 ENCOUNTER — Other Ambulatory Visit: Payer: Self-pay

## 2021-02-13 DIAGNOSIS — I480 Paroxysmal atrial fibrillation: Secondary | ICD-10-CM

## 2021-02-13 DIAGNOSIS — Z7901 Long term (current) use of anticoagulants: Secondary | ICD-10-CM

## 2021-02-13 LAB — POCT INR: INR: 2.1 (ref 2.0–3.0)

## 2021-02-13 NOTE — Patient Instructions (Signed)
Description   Take 2 tablets (total) of warfarin for today, then continue taking 1 tablet daily except for 1.5 tablets  each Sunday, Tuesday and Thursday. Repeat INR in 6 weeks. [phone]

## 2021-02-21 NOTE — Progress Notes (Signed)
Remote pacemaker transmission.   

## 2021-03-28 ENCOUNTER — Ambulatory Visit (INDEPENDENT_AMBULATORY_CARE_PROVIDER_SITE_OTHER): Payer: Medicare PPO

## 2021-03-28 ENCOUNTER — Other Ambulatory Visit: Payer: Self-pay

## 2021-03-28 DIAGNOSIS — I480 Paroxysmal atrial fibrillation: Secondary | ICD-10-CM

## 2021-03-28 DIAGNOSIS — Z7901 Long term (current) use of anticoagulants: Secondary | ICD-10-CM | POA: Diagnosis not present

## 2021-03-28 DIAGNOSIS — Z5181 Encounter for therapeutic drug level monitoring: Secondary | ICD-10-CM | POA: Diagnosis not present

## 2021-03-28 LAB — POCT INR: INR: 3.1 — AB (ref 2.0–3.0)

## 2021-03-28 NOTE — Patient Instructions (Signed)
continue taking 1 tablet daily except for 1.5 tablets  each Sunday, Tuesday and Thursday. Repeat INR in 6 weeks. [phone];  Eat greens tonight ?

## 2021-04-05 ENCOUNTER — Observation Stay (HOSPITAL_COMMUNITY)
Admission: EM | Admit: 2021-04-05 | Discharge: 2021-04-07 | Disposition: A | Discharge status: Home / Self Care | Payer: Medicare PPO | Attending: Internal Medicine | Admitting: Internal Medicine

## 2021-04-05 ENCOUNTER — Other Ambulatory Visit: Payer: Self-pay

## 2021-04-05 ENCOUNTER — Encounter (HOSPITAL_COMMUNITY): Payer: Self-pay

## 2021-04-05 ENCOUNTER — Emergency Department (HOSPITAL_COMMUNITY): Payer: Medicare PPO

## 2021-04-05 DIAGNOSIS — Z79899 Other long term (current) drug therapy: Secondary | ICD-10-CM | POA: Insufficient documentation

## 2021-04-05 DIAGNOSIS — I5042 Chronic combined systolic (congestive) and diastolic (congestive) heart failure: Secondary | ICD-10-CM | POA: Diagnosis not present

## 2021-04-05 DIAGNOSIS — I480 Paroxysmal atrial fibrillation: Secondary | ICD-10-CM | POA: Diagnosis present

## 2021-04-05 DIAGNOSIS — I110 Hypertensive heart disease with heart failure: Secondary | ICD-10-CM | POA: Diagnosis not present

## 2021-04-05 DIAGNOSIS — E039 Hypothyroidism, unspecified: Secondary | ICD-10-CM | POA: Diagnosis not present

## 2021-04-05 DIAGNOSIS — E669 Obesity, unspecified: Secondary | ICD-10-CM | POA: Diagnosis present

## 2021-04-05 DIAGNOSIS — Z7901 Long term (current) use of anticoagulants: Secondary | ICD-10-CM | POA: Insufficient documentation

## 2021-04-05 DIAGNOSIS — Z9889 Other specified postprocedural states: Secondary | ICD-10-CM

## 2021-04-05 DIAGNOSIS — E1165 Type 2 diabetes mellitus with hyperglycemia: Secondary | ICD-10-CM | POA: Insufficient documentation

## 2021-04-05 DIAGNOSIS — K625 Hemorrhage of anus and rectum: Secondary | ICD-10-CM | POA: Diagnosis present

## 2021-04-05 DIAGNOSIS — R739 Hyperglycemia, unspecified: Secondary | ICD-10-CM | POA: Diagnosis present

## 2021-04-05 DIAGNOSIS — Z950 Presence of cardiac pacemaker: Secondary | ICD-10-CM | POA: Diagnosis not present

## 2021-04-05 DIAGNOSIS — Z20822 Contact with and (suspected) exposure to covid-19: Secondary | ICD-10-CM | POA: Diagnosis not present

## 2021-04-05 DIAGNOSIS — E7800 Pure hypercholesterolemia, unspecified: Secondary | ICD-10-CM | POA: Diagnosis present

## 2021-04-05 DIAGNOSIS — K5733 Diverticulitis of large intestine without perforation or abscess with bleeding: Secondary | ICD-10-CM | POA: Diagnosis not present

## 2021-04-05 DIAGNOSIS — Z87891 Personal history of nicotine dependence: Secondary | ICD-10-CM | POA: Diagnosis not present

## 2021-04-05 DIAGNOSIS — I10 Essential (primary) hypertension: Secondary | ICD-10-CM | POA: Diagnosis present

## 2021-04-05 LAB — MAGNESIUM: Magnesium: 2.2 mg/dL (ref 1.7–2.4)

## 2021-04-05 LAB — CBC WITH DIFFERENTIAL/PLATELET
Abs Immature Granulocytes: 0.02 10*3/uL (ref 0.00–0.07)
Basophils Absolute: 0 10*3/uL (ref 0.0–0.1)
Basophils Relative: 0 %
Eosinophils Absolute: 0 10*3/uL (ref 0.0–0.5)
Eosinophils Relative: 0 %
HCT: 42.9 % (ref 36.0–46.0)
Hemoglobin: 13.7 g/dL (ref 12.0–15.0)
Immature Granulocytes: 0 %
Lymphocytes Relative: 10 %
Lymphs Abs: 0.8 10*3/uL (ref 0.7–4.0)
MCH: 27.2 pg (ref 26.0–34.0)
MCHC: 31.9 g/dL (ref 30.0–36.0)
MCV: 85.3 fL (ref 80.0–100.0)
Monocytes Absolute: 0.7 10*3/uL (ref 0.1–1.0)
Monocytes Relative: 8 %
Neutro Abs: 6.8 10*3/uL (ref 1.7–7.7)
Neutrophils Relative %: 82 %
Platelets: 278 10*3/uL (ref 150–400)
RBC: 5.03 MIL/uL (ref 3.87–5.11)
RDW: 13.9 % (ref 11.5–15.5)
WBC: 8.3 10*3/uL (ref 4.0–10.5)
nRBC: 0 % (ref 0.0–0.2)

## 2021-04-05 LAB — COMPREHENSIVE METABOLIC PANEL
ALT: 15 U/L (ref 0–44)
AST: 15 U/L (ref 15–41)
Albumin: 3.5 g/dL (ref 3.5–5.0)
Alkaline Phosphatase: 51 U/L (ref 38–126)
Anion gap: 9 (ref 5–15)
BUN: 13 mg/dL (ref 8–23)
CO2: 26 mmol/L (ref 22–32)
Calcium: 9.1 mg/dL (ref 8.9–10.3)
Chloride: 103 mmol/L (ref 98–111)
Creatinine, Ser: 1.04 mg/dL — ABNORMAL HIGH (ref 0.44–1.00)
GFR, Estimated: 56 mL/min — ABNORMAL LOW (ref >60)
Glucose, Bld: 155 mg/dL — ABNORMAL HIGH (ref 70–99)
Potassium: 3.5 mmol/L (ref 3.5–5.1)
Sodium: 138 mmol/L (ref 135–145)
Total Bilirubin: 0.4 mg/dL (ref 0.3–1.2)
Total Protein: 7.6 g/dL (ref 6.5–8.1)

## 2021-04-05 LAB — URINALYSIS, ROUTINE W REFLEX MICROSCOPIC
Bacteria, UA: NONE SEEN
Bilirubin Urine: NEGATIVE
Glucose, UA: NEGATIVE mg/dL
Hgb urine dipstick: NEGATIVE
Ketones, ur: NEGATIVE mg/dL
Leukocytes,Ua: NEGATIVE
Nitrite: NEGATIVE
Protein, ur: 30 mg/dL — AB
Specific Gravity, Urine: 1.019 (ref 1.005–1.030)
pH: 5 (ref 5.0–8.0)

## 2021-04-05 LAB — LIPASE, BLOOD: Lipase: 60 U/L — ABNORMAL HIGH (ref 11–51)

## 2021-04-05 LAB — HEMOGLOBIN AND HEMATOCRIT, BLOOD
HCT: 42 % (ref 36.0–46.0)
HCT: 41.4 % (ref 36.0–46.0)
HCT: 39.9 % (ref 36.0–46.0)
Hemoglobin: 13.2 g/dL (ref 12.0–15.0)
Hemoglobin: 13.1 g/dL (ref 12.0–15.0)
Hemoglobin: 12.9 g/dL (ref 12.0–15.0)

## 2021-04-05 LAB — PROTIME-INR
INR: 2.6 — ABNORMAL HIGH (ref 0.8–1.2)
INR: 1.4 — ABNORMAL HIGH (ref 0.8–1.2)
Prothrombin Time: 27.6 seconds — ABNORMAL HIGH (ref 11.4–15.2)
Prothrombin Time: 17.6 seconds — ABNORMAL HIGH (ref 11.4–15.2)

## 2021-04-05 LAB — RESP PANEL BY RT-PCR (FLU A&B, COVID) ARPGX2
Influenza A by PCR: NEGATIVE
Influenza B by PCR: NEGATIVE
SARS Coronavirus 2 by RT PCR: NEGATIVE

## 2021-04-05 LAB — POC OCCULT BLOOD, ED: Fecal Occult Bld: POSITIVE — AB

## 2021-04-05 MED ORDER — LEVOTHYROXINE SODIUM 75 MCG PO TABS
75.0000 ug | ORAL_TABLET | Freq: Every day | ORAL | Status: DC
  Administered 2021-04-05 – 2021-04-07 (×3): 75 ug via ORAL
  Filled 2021-04-05 (×3): qty 1

## 2021-04-05 MED ORDER — ACETAMINOPHEN 325 MG PO TABS: 650.0000 mg | ORAL_TABLET | Freq: Four times a day (QID) | ORAL | Status: DC | PRN

## 2021-04-05 MED ORDER — DEXTROSE 5 % IV SOLN
10.0000 mg | Freq: Once | INTRAVENOUS | Status: AC
  Administered 2021-04-05: 06:00:00 10 mg via INTRAVENOUS
  Filled 2021-04-05: qty 1

## 2021-04-05 MED ORDER — IOHEXOL 300 MG/ML  SOLN
100.0000 mL | Freq: Once | INTRAMUSCULAR | Status: AC | PRN
  Administered 2021-04-05: 04:00:00 100 mL via INTRAVENOUS

## 2021-04-05 MED ORDER — ACETAMINOPHEN 650 MG RE SUPP: 650.0000 mg | Freq: Four times a day (QID) | RECTAL | Status: DC | PRN

## 2021-04-05 MED ORDER — ONDANSETRON HCL 4 MG/2ML IJ SOLN: 4.0000 mg | Freq: Four times a day (QID) | INTRAMUSCULAR | Status: DC | PRN

## 2021-04-05 MED ORDER — OXYCODONE HCL 5 MG PO TABS
5.0000 mg | ORAL_TABLET | ORAL | Status: DC | PRN
  Administered 2021-04-06: 5 mg via ORAL
  Filled 2021-04-05: qty 1

## 2021-04-05 MED ORDER — SODIUM CHLORIDE (PF) 0.9 % IJ SOLN
INTRAMUSCULAR | Status: AC
  Filled 2021-04-05: qty 50

## 2021-04-05 MED ORDER — SODIUM CHLORIDE 0.9 % IV SOLN
2.0000 g | INTRAVENOUS | Status: DC
  Administered 2021-04-06 – 2021-04-07 (×2): 2 g via INTRAVENOUS
  Filled 2021-04-05 (×2): qty 20

## 2021-04-05 MED ORDER — ONDANSETRON HCL 4 MG PO TABS: 4.0000 mg | ORAL_TABLET | Freq: Four times a day (QID) | ORAL | Status: DC | PRN

## 2021-04-05 MED ORDER — DM-GUAIFENESIN ER 30-600 MG PO TB12
1.0000 | ORAL_TABLET | Freq: Two times a day (BID) | ORAL | Status: DC
Start: 2021-04-05 — End: 2021-04-05
  Filled 2021-04-05: qty 1

## 2021-04-05 MED ORDER — SOTALOL HCL 80 MG PO TABS
80.0000 mg | ORAL_TABLET | Freq: Two times a day (BID) | ORAL | Status: DC
  Administered 2021-04-05 – 2021-04-07 (×5): 80 mg via ORAL
  Filled 2021-04-05 (×6): qty 1

## 2021-04-05 MED ORDER — TRIAMTERENE-HCTZ 37.5-25 MG PO TABS
0.5000 | ORAL_TABLET | Freq: Every day | ORAL | Status: DC
  Administered 2021-04-05 – 2021-04-06 (×2): 0.5 via ORAL
  Filled 2021-04-05 (×2): qty 1

## 2021-04-05 MED ORDER — METRONIDAZOLE 500 MG/100ML IV SOLN
500.0000 mg | Freq: Two times a day (BID) | INTRAVENOUS | Status: DC
  Administered 2021-04-05 – 2021-04-07 (×4): 500 mg via INTRAVENOUS
  Filled 2021-04-05 (×4): qty 100

## 2021-04-05 MED ORDER — HYDROMORPHONE HCL 1 MG/ML IJ SOLN
1.0000 mg | INTRAMUSCULAR | Status: DC | PRN
  Administered 2021-04-05: 11:00:00 1 mg via INTRAVENOUS
  Filled 2021-04-05: qty 1

## 2021-04-05 MED ORDER — SODIUM CHLORIDE 0.9 % IV SOLN
INTRAVENOUS | Status: DC | PRN
  Administered 2021-04-05: 19:00:00 10 mL/h via INTRAVENOUS

## 2021-04-05 MED ORDER — RAMIPRIL 10 MG PO CAPS
10.0000 mg | ORAL_CAPSULE | Freq: Every day | ORAL | Status: DC
  Filled 2021-04-05 (×3): qty 1

## 2021-04-05 MED ORDER — HYDROMORPHONE HCL 1 MG/ML IJ SOLN
0.5000 mg | Freq: Once | INTRAMUSCULAR | Status: AC
  Administered 2021-04-05: 06:00:00 0.5 mg via INTRAVENOUS
  Filled 2021-04-05: qty 1

## 2021-04-05 MED ORDER — ALBUTEROL SULFATE (2.5 MG/3ML) 0.083% IN NEBU: 2.5000 mg | INHALATION_SOLUTION | RESPIRATORY_TRACT | Status: DC | PRN

## 2021-04-05 MED ORDER — PROCHLORPERAZINE EDISYLATE 10 MG/2ML IJ SOLN: 5.0000 mg | INTRAMUSCULAR | Status: DC | PRN

## 2021-04-05 MED ORDER — METRONIDAZOLE 500 MG/100ML IV SOLN
500.0000 mg | Freq: Once | INTRAVENOUS | Status: AC
  Administered 2021-04-05: 07:00:00 500 mg via INTRAVENOUS
  Filled 2021-04-05: qty 100

## 2021-04-05 MED ORDER — SODIUM CHLORIDE 0.9 % IV SOLN
2.0000 g | Freq: Once | INTRAVENOUS | Status: AC
  Administered 2021-04-05: 06:00:00 2 g via INTRAVENOUS
  Filled 2021-04-05: qty 20

## 2021-04-05 MED ORDER — ROSUVASTATIN CALCIUM 10 MG PO TABS
10.0000 mg | ORAL_TABLET | ORAL | Status: DC
  Administered 2021-04-06: 10 mg via ORAL
  Filled 2021-04-05 (×2): qty 1

## 2021-04-05 MED ORDER — APIXABAN 5 MG PO TABS
5.0000 mg | ORAL_TABLET | Freq: Two times a day (BID) | ORAL | Status: DC
  Administered 2021-04-05 – 2021-04-07 (×4): 5 mg via ORAL
  Filled 2021-04-05 (×4): qty 1

## 2021-04-05 MED ORDER — POTASSIUM CHLORIDE CRYS ER 20 MEQ PO TBCR
40.0000 meq | EXTENDED_RELEASE_TABLET | Freq: Once | ORAL | Status: AC
  Administered 2021-04-05: 11:00:00 40 meq via ORAL
  Filled 2021-04-05: qty 2

## 2021-04-05 NOTE — ED Provider Notes (Signed)
[name] DEPT Provider Note  CSN: [id number] Arrival date & time: 04/05/21 0149  Chief Complaint(s) Abdominal Pain and Rectal Bleeding  HPI [name] is a 76 y.o. female h/o A. Fib on coumadin    Abdominal Pain Pain location:  Suprapubic and LLQ Pain quality: aching and cramping   Pain severity:  Moderate Onset quality:  Gradual Duration:  2 days Timing:  Intermittent Progression:  Waxing and waning Chronicity:  New Context: not sick contacts and not suspicious food intake   Relieved by:  Nothing Worsened by:  Nothing Associated symptoms: diarrhea (bloody) and hematochezia   Associated symptoms: no chills, no fever and no melena   Rectal Bleeding Associated symptoms: abdominal pain   Associated symptoms: no fever    Past Medical History Past Medical History:  Diagnosis Date   Arthritis    Atrial fibrillation ([place])    PAF---currently anticoagulated with Warfarin   Atrial flutter ([place]) 02/21/2004   s/p mitral valve repair and ppm insertion   Congenital heart block 08/26/2003   Diagnosed by Dr. Clovis Fredrickson. A temp orary pacemaker was placed in 1988 during hysterectomy procedure.   Diabetes mellitus without complication ([place])    prediabetic   Diverticulosis    Dyslipidemia    takes Niacin and Crestor daily   History of blood transfusion    no abnormal reaction noted   Hypothyroidism    takes Synthroid daily   Mitral valve insufficiency    Recieved Mitral valve repair with reconstruction of the anterior leaflet cordis with cortex and ring annuloplasty   Nonischemic cardiomyopathy ([place]) 2006   EF of 35% due to AV dyssyncrony. Has since resolved per most recent ECHO.   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 Authorizing Provider  acetaminophen (TYLENOL) 325 MG tablet Take 650 mg by mouth as needed for pain.    [provider]  cholecalciferol (VITAMIN D) 400 UNITS TABS Take 400 Units by mouth daily.    [provider]  folic acid (FOLVITE) 1 MG tablet Take 1 tablet (1 mg total) by mouth daily. 11/30/20   Croitoru, Mihai, MD  HYDROcodone-acetaminophen (NORCO) 5-325 MG tablet Take 1-2 tablets by mouth every 6 (six) hours as needed for moderate pain or severe pain. Patient not taking: Reported on 01/02/2021 11/22/14   Fanny Skates, MD  levothyroxine (SYNTHROID) 75 MCG tablet Take 1 tablet (75 mcg total) by mouth daily before breakfast. 11/20/18   Croitoru, Mihai, MD  niacin 500 MG tablet Take 500 mg by mouth every evening.     [provider]  Omega-3 Fatty Acids (OMEGA 3 PO)  Take 1,000 mg by mouth daily.    [provider]  ramipril (ALTACE) 10 MG capsule Take 1 capsule (10 mg total) by mouth at bedtime. 07/07/20   Almyra Deforest, PA  rosuvastatin (CRESTOR) 10 MG tablet Take 1 tablet (10 mg total) by mouth daily. 07/07/20   Almyra Deforest, PA  sotalol (BETAPACE) 80 MG tablet TAKE 1 TABLET(80 MG) BY MOUTH TWICE DAILY 08/08/20   Croitoru, Dani Gobble, MD  triamterene-hydrochlorothiazide (MAXZIDE-25) 37.5-25 MG tablet  TAKE 1/2 TABLET BY MOUTH EVERY DAY AS DIRECTED 07/07/20   Almyra Deforest, PA  warfarin (COUMADIN) 5 MG tablet TAKE 1 TO 1 AND 1/2 TABLETS BY MOUTH EVERY DAY AS DIRECTED BY COUMADIN CLINIC 08/29/20   Croitoru, Dani Gobble, MD                                                                                                                                    Allergies Patient has no known allergies.  Review of Systems Review of Systems  Constitutional:  Negative for chills and fever.  Gastrointestinal:  Positive for abdominal pain, diarrhea (bloody) and hematochezia. Negative for melena.  As noted in HPI  Physical Exam Vital Signs  I have reviewed the triage vital signs BP 120/67    Pulse 81    Temp 98.5 F (36.9 C) (Oral)    Resp 16    Ht '5\' 3"'$  (1.6 m)    Wt 95.3 kg    SpO2 98%    BMI 37.20 kg/m   Physical Exam Vitals reviewed.  Constitutional:      General: She is not in acute distress.    Appearance: She is well-developed. She is not diaphoretic.  HENT:     Head: Normocephalic and atraumatic.     Right Ear: External ear normal.     Left Ear: External ear normal.     Nose: Nose normal.  Eyes:     General: No scleral icterus.    Conjunctiva/sclera: Conjunctivae normal.  Neck:     Trachea: Phonation normal.  Cardiovascular:     Rate and Rhythm: Normal rate and regular rhythm.  Pulmonary:     Effort: Pulmonary effort is normal. No respiratory distress.     Breath sounds: No stridor.  Abdominal:     General: There is no distension.     Tenderness: There is abdominal tenderness in the suprapubic area and left lower quadrant. There is no guarding or rebound.  Genitourinary:    Rectum: Guaiac result positive.  Musculoskeletal:        General: Normal range of motion.     Cervical back: Normal range of motion.  Neurological:     Mental Status: She is alert and oriented to person, place, and time.  Psychiatric:        Behavior: Behavior normal.    ED Results and Treatments Labs (all labs  ordered are listed, but only abnormal results are displayed) Labs  Reviewed  COMPREHENSIVE METABOLIC PANEL - Abnormal; Notable for the following components:      Result Value   Glucose, Bld 155 (*)    Creatinine, Ser 1.04 (*)    GFR, Estimated 56 (*)    All other components within normal limits  LIPASE, BLOOD - Abnormal; Notable for the following components:   Lipase 60 (*)    All other components within normal limits  URINALYSIS, ROUTINE W REFLEX MICROSCOPIC - Abnormal; Notable for the following components:   Protein, ur 30 (*)    All other components within normal limits  PROTIME-INR - Abnormal; Notable for the following components:   Prothrombin Time 27.6 (*)    INR 2.6 (*)    All other components within normal limits  POC OCCULT BLOOD, ED - Abnormal; Notable for the following components:   Fecal Occult Bld POSITIVE (*)    All other components within normal limits  RESP PANEL BY RT-PCR (FLU A&B, COVID) ARPGX2  CBC WITH DIFFERENTIAL/PLATELET                                                                                                                         EKG  EKG Interpretation  Date/Time:    Ventricular Rate:    PR Interval:    QRS Duration:   QT Interval:    QTC Calculation:   R Axis:     Text Interpretation:         Radiology CT ABDOMEN PELVIS W CONTRAST  Result Date: 04/05/2021 CLINICAL DATA:  Left lower quadrant pain. EXAM: CT ABDOMEN AND PELVIS WITH CONTRAST TECHNIQUE: Multidetector CT imaging of the abdomen and pelvis was performed using the standard protocol following bolus administration of intravenous contrast. RADIATION DOSE REDUCTION: This exam was performed according to the departmental dose-optimization program which includes automated exposure control, adjustment of the mA and/or kV according to patient size and/or use of iterative reconstruction technique. CONTRAST:  194m OMNIPAQUE IOHEXOL 300 MG/ML  SOLN COMPARISON:  None. FINDINGS: Lower chest: There  is a small Bochdalek's fat herniation through the diaphragm into the posteromedial lower left chest. There is scattered linear scarring or atelectasis of the lung bases without infiltrates. Small hiatal hernia. There is mild cardiomegaly. There is metallic artifact along the mid/lower left heart border related to external pacing wiring. Hepatobiliary: Unremarkable liver, gallbladder and bile ducts. No portal vein dilatation. Pancreas: There is no mass enhancement, ductal dilatation or adjacent edema. Spleen: No mass or splenomegaly. Adrenals/Urinary Tract: There is no adrenal mass, no focal renal cortical abnormality and no evidence for urinary stones or obstruction. The bladder is mildly thickened but not fully distended. Stomach/Bowel: Small hiatal hernia. Unremarkable gastric wall and unopacified small bowel. Normal appendix. Diffuse diverticulosis is seen. 8 cm segment of thickened and inflamed large bowel is seen at the junction of the descending and sigmoid colon with moderate surrounding inflammatory reaction and trace nonlocalizing reactive fluid in the distal left paracolic gutter. There is no adjacent abscess or  free air. Advanced diverticulosis continues along the sigmoid segment without additional inflammatory reaction. Vascular/Lymphatic: Aortic atherosclerosis. No enlarged abdominal or pelvic lymph nodes. Reproductive: Status post hysterectomy. No adnexal masses. Other: There are small umbilical and inguinal fat hernias. There is no free hemorrhage, abscess or free air. Only free fluid is the trace amount in the distal left paracolic gutter. Musculoskeletal: There is osteopenia and advanced degenerative disc changes in the lumbar spine, lower thoracic spine with moderate lumbar facet hypertrophy. No worrisome regional skeletal lesion. 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 IMPRESSION: 1. Acute diverticulitis, junction of the distal descending and proximal sigmoid colon.  There is adjacent trace nonlocalizing reactive fluid but no free air or abscess. Follow-up colonoscopy recommended after treatment to exclude underlying lesion. 2. Other diffuse colonic diverticula are uncomplicated. 3. Small hiatal hernia.  Small umbilical and inguinal fat hernias. 4. Mild cardiomegaly. 5. Aortic atherosclerosis. 6. Osteopenia and degenerative change. Electronically Signed   By: Telford Nab M.D.   On: 04/05/2021 04:57    Pertinent labs & imaging results that were available during my care of the patient were reviewed by me and considered in my medical decision making (see MDM for details).  Medications Ordered in ED Medications  phytonadione (VITAMIN K) 10 mg in dextrose 5 % 50 mL IVPB (10 mg Intravenous New Bag/Given 04/05/21 0531)  cefTRIAXone (ROCEPHIN) 2 g in sodium chloride 0.9 % 100 mL IVPB (2 g Intravenous New Bag/Given 04/05/21 0533)    And  metroNIDAZOLE (FLAGYL) IVPB 500 mg (has no administration in time range)  iohexol (OMNIPAQUE) 300 MG/ML solution 100 mL (100 mLs Intravenous Contrast Given 04/05/21 0421)  sodium chloride (PF) 0.9 % injection (  Given by Other 04/05/21 0505)  HYDROmorphone (DILAUDID) injection 0.5 mg (0.5 mg Intravenous Given 04/05/21 0550)                                                                                                                                     Procedures Procedures  (including critical care time)  Medical Decision Making / ED Course    Complexity of Problem:  Co-morbidities/SDOH that complicate the patient evaluation/care: Anticoagulation, h/o diverticulosis  Additional history obtained: none  Patient's presenting problem/concern and DDX listed below: Abd pain with rectal bleeding Diverticulitis, colitis, bleeding AVM, etc     Complexity of Data:   Cardiac Monitoring: none  Laboratory Tests ordered listed below with my independent interpretation: CBC without leukocytosis or anemia. No significant electrolyte  derangements or renal sufficiency. INR therapeutic at 2.6. Hemoccult positive   Imaging Studies ordered listed below with my independent interpretation: CT suspicious of diverticulitis Confirmed by radiology     ED Course:    Hospitalization Considered:  Yes  Assessment, Intervention, and Reassessment: Diverticulitis with bleeding Patient is not septic but will require IV antibiotics. Vitamin K. Requires admission to the hospital.  Spoke with Dr. Tonie Griffith who agreed to  admit patient for further work-up and management   Final Clinical Impression(s) / ED Diagnoses Final diagnoses:  Diverticulitis of large intestine without perforation or abscess with bleeding  Anticoagulated           This chart was dictated using voice recognition software.  Despite best efforts to proofread,  errors can occur which can change the documentation meaning.    Fatima Blank, MD 04/05/21 [phone]

## 2021-04-05 NOTE — Progress Notes (Signed)
?  Transition of Care (TOC) Screening Note ? ? ?Patient Details  ?Name: [name] ?Date of Birth: 31-Jan-1945 ? ? ?Transition of Care (TOC) CM/SW Contact:    ?Earlisha Sharples, LCSW ?Phone Number: ?04/05/2021, 3:08 PM ? ? ? ?Transition of Care Department [facility]) has reviewed patient and no TOC needs have been identified at this time. We will continue to monitor patient advancement through interdisciplinary progression rounds. If new patient transition needs arise, please place a TOC consult. ? ? ?

## 2021-04-05 NOTE — ED Triage Notes (Signed)
Pt reports with abdominal pain and rectal bleeding x 2 days. Pt states that she has the bleeding with diarrhea and has some nausea and vomiting.  ?

## 2021-04-05 NOTE — H&P (Signed)
History and Physical    Patient: [name] OQH:[id number] DOB: Aug 23, 1945 DOA: 04/05/2021 DOS: the patient was seen and examined on 04/05/2021 PCP: [facility]  Patient coming from: Home  Chief Complaint:  Chief Complaint  Patient presents with   Abdominal Pain   Rectal Bleeding   HPI: [name] is a 76 y.o. female with medical history significant of osteoarthritis, paroxysmal atrial fibrillation, atrial flutter, mitral valve insufficiency, history of congenital heart block, history of mitral valve repair and PPM insertion, nonischemic cardiomyopathy, chronic combined systolic and diastolic heart failure, type 2 diabetes, diverticulosis, hyperlipidemia, hypothyroidism, hypertension who is coming to the emergency department due to LLQ abdominal pain associated with multiple episodes of hematochezia with loose stools, nausea, emesis x3 yesterday.  She denied melena.  No flank pain, dysuria, frequency or hematuria.  Denied chest pain, palpitations, diaphoresis, dyspnea, PND, orthopnea or recent pitting edema of the lower extremities.  No polyuria, polydipsia, polyphagia or blurred vision.  ED course: Initial vital signs were temperature 98.5 F, pulse 87, respiration 20, BP 176/95 mmHg and O2 sat 91% on room air.  Labwork: Her urinalysis showed proteinuria 30 mg/dL, but was otherwise normal.  CBC is her white count 8.3, monitor 18.7 g/dL and platelets 278.  PT was 27.6 and INR 2.6.  Fecal occult blood was positive.  Lipase was 60.  CMP with a glucose of 155 and creatinine 1.04 mg/dL.  The rest of the CMP measurements were unremarkable.  Imaging: CT abdomen/pelvis with contrast show acute diverticulitis at the junction of the distal descending and proximal sigmoid colon with adjacent trace nonlocalized and reactive fluid but no free air or abscess.  Colonoscopy recommended after treatment.  There were other diffuse colonic diverticula that are uncomplicated.  There is  a small hiatal hernia.  There is mild cardiomegaly.  Aortic atherosclerosis.  Osteopenia and degenerative change.  Please see images and full radiology report for further details.   Review of Systems: As mentioned in the history of present illness. All other systems reviewed and are negative. Past Medical History:  Diagnosis Date   Arthritis    Atrial fibrillation [facility])    PAF---currently anticoagulated with Warfarin   Atrial flutter ([place]) 02/21/2004   s/p mitral valve repair and ppm insertion   Congenital heart block 08/26/2003   Diagnosed by Dr. Clovis Fredrickson. A temp orary pacemaker was placed in 1988 during hysterectomy procedure.   Diabetes mellitus without complication ([place])    prediabetic   Diverticulosis    Dyslipidemia    takes Niacin and Crestor daily   History of blood transfusion    no abnormal reaction noted   Hypothyroidism    takes Synthroid daily   Mitral valve insufficiency    Recieved Mitral valve repair with reconstruction of the anterior leaflet cordis with cortex and ring annuloplasty   Nonischemic cardiomyopathy ([place]) 2006   EF of 35% due to AV dyssyncrony. Has since resolved per most recent ECHO.   RBBB    Due the VP with the LV epicardial lead that was inserted in 2006 during the mitral repair   Systemic hypertension    takes  Ramipril daily-Maxzide every other day   Urinary urgency    Past Surgical History:  Procedure Laterality Date   ABDOMINAL HYSTERECTOMY     BREAST LUMPECTOMY WITH RADIOACTIVE SEED LOCALIZATION Right 11/22/2014   Procedure: RIGHT BREAST LUMPECTOMY WITH RADIOACTIVE SEED LOCALIZATION;  Surgeon: Fanny Skates, MD;  Location: [place];  Service: General;  Laterality: Right;   CARDIAC CATHETERIZATION  10/20/2003   Minimal decrease in the LV systolic EF of 88-28% with global hyperkinesis. Moderately dilated LV. 3+ Mitral regurgitation. Underfilling of LAD which promptly improved with intracoronary NTG administration. Microvascular angina or  microvascular spasm. Otherwise coronaries were normal. No intervention necessary.   CARDIOVASCULAR STRESS TEST  09/14/2003   Performed for the pt's congenital CHB that was discovered during a preoperative evaluation for a colonoscopy.  EKG demonstrated CHB with junctional escape. Nonspecific ST changes noted. Stress ECG was positive for ischemia(2 mm ST depression). Dilated LV cavity. Inability to calculate systolic function. High risk scan. This scan was followed up with a cardiac cath.   COLONOSCOPY     MITRAL VALVE ANNULOPLASTY  02/06/2004   Performed for severe MR requiring mitral valve repair with anterior leaflet reconstruction and a Clifton annuloplasty ring--model: [id number], and an 80% lesion reduction (Dr. Servando Snare). Two LV leads were placed epicardially just in the event that biv pacing would be needed. (One of the leads had been used for during the ppm insertion recieved one week later)    PERMANENT PACEMAKER GENERATOR CHANGE  07/06/2012   [employer]   PERMANENT PACEMAKER GENERATOR CHANGE N/A 07/06/2012   Procedure: PERMANENT PACEMAKER GENERATOR CHANGE;  Surgeon: Sanda Klein, MD;  Location: [place] CATH LAB;  Service: Cardiovascular;  Laterality: N/A;   PERMANENT PACEMAKER INSERTION  02/13/2004   PPM implant following a mitral valve annuloplasty. An epicardial active fixation lead (implanted on 02/06/2004 during the mitral valve procedure) was used for this implant.    US ECHOCARDIOGRAPHY  09/10/2010 and1/07/2008   The last echo showed an EF of >55%; RV mildly dilated. Mild to moderately dilated LA  with borderline RA dilation as well. Impaired diastolic function. Normal RV systolic function.MVR without residual insufficiency.No Pericardial effusion.   Social History:  reports that she has quit smoking. Her smoking use included cigarettes. She has never used smokeless tobacco. She reports current alcohol use. She reports that she does not use drugs.  No Known Allergies  Family  History  Problem Relation Age of Onset   Cancer - Other Mother    Pneumonia Father    Asthma Brother    Healthy Brother    Asthma Sister    Healthy Brother    Healthy Brother    Healthy Brother     Prior to Admission medications   Medication Sig Start Date End Date Taking? Authorizing Provider  acetaminophen (TYLENOL) 325 MG tablet Take 650 mg by mouth daily as needed for pain.   Yes [provider]  cholecalciferol (VITAMIN D) 400 UNITS TABS Take 400 Units by mouth daily.   Yes [provider]  folic acid (FOLVITE) 1 MG tablet Take 1 tablet (1 mg total) by mouth daily. 11/30/20  Yes Croitoru, Mihai, MD  levothyroxine (SYNTHROID) 75 MCG tablet Take 1 tablet (75 mcg total) by mouth daily before breakfast. 11/20/18  Yes Croitoru, Mihai, MD  niacin 500 MG tablet Take 500 mg by mouth every evening.    Yes [provider]  Omega-3 Fatty Acids (OMEGA 3 PO) Take 1,000 mg by mouth daily.   Yes [provider]  ramipril (ALTACE) 10 MG capsule Take 1 capsule (10 mg total) by mouth at bedtime. 07/07/20  Yes Almyra Deforest, PA  rosuvastatin (CRESTOR) 10 MG tablet Take 1 tablet (10 mg total) by mouth daily. Patient taking differently: Take 10 mg by mouth every other day. 07/07/20  Yes Almyra Deforest, PA  sotalol (BETAPACE) 80 MG tablet TAKE 1 TABLET(80 MG) BY MOUTH TWICE DAILY Patient taking differently: Take 80 mg by mouth 2 (two) times daily. 08/08/20  Yes Croitoru, Mihai, MD  triamterene-hydrochlorothiazide (MAXZIDE-25) 37.5-25 MG tablet TAKE 1/2 TABLET BY MOUTH EVERY DAY AS DIRECTED Patient taking differently: Take 0.5 tablets by mouth daily. 07/07/20  Yes Almyra Deforest, PA  warfarin (COUMADIN) 5 MG tablet TAKE 1 TO 1 AND 1/2 TABLETS BY MOUTH EVERY DAY AS DIRECTED BY [facility]IN[facility] Patient taking differently: Take 5-7.5 mg by mouth See admin instructions. 7.'5mg'$  every Sunday, Tuesday, Thursday and '5mg'$  all other days 08/29/20  Yes Croitoru, Dani Gobble, MD    Physical Exam: Vitals:    04/05/21 0340 04/05/21 0530 04/05/21 0830 04/05/21 0927  BP: 129/67 120/67 124/68 (!) 141/87  Pulse: 80 81 81 74  Resp: '18 16 16 16  '$ Temp:    98.3 F (36.8 C)  TempSrc:    Oral  SpO2: 99% 98% 97% 98%  Weight:      Height:       Physical Exam Vitals and nursing note reviewed.  Constitutional:      Appearance: She is well-developed. She is obese.  HENT:     Head: Normocephalic.     Mouth/Throat:     Mouth: Mucous membranes are moist.  Eyes:     Pupils: Pupils are equal, round, and reactive to light.  Neck:     Vascular: No JVD.  Cardiovascular:     Rate and Rhythm: Normal rate and regular rhythm.     Heart sounds: Normal heart sounds.  Pulmonary:     Effort: Pulmonary effort is normal.     Breath sounds: Normal breath sounds.  Abdominal:     General: Bowel sounds are normal.     Palpations: Abdomen is soft.     Tenderness: There is abdominal tenderness in the left lower quadrant. There is no guarding or rebound.  Musculoskeletal:     Cervical back: Neck supple.     Right lower leg: No edema.     Left lower leg: No edema.  Skin:    General: Skin is warm and dry.  Neurological:     General: No focal deficit present.     Mental Status: She is alert and oriented to person, place, and time.  Psychiatric:        Mood and Affect: Mood normal.        Behavior: Behavior normal.   Data Reviewed:  There are no new results to review at this time.  Assessment and Plan: Principal Problem:   Diverticulitis of colon with bleeding Admit to MedSurg/inpatient. Clear liquid diet. Antiemetics as needed. Analgesics as needed. Continue ceftriaxone 2 g IVPB every 24 hours. Continue metronidazole 500 mg IVPB every 12 hours. Gastroenterology consult appreciated. Will need colonoscopy in 6 to 8 weeks as an outpatient. Follow-up CBC and chemistry in AM.  Active Problems:   PAF (paroxysmal atrial fibrillation) (HCC)   Status post mitral valve annuloplasty Discussed with  cardiology. They recommend that: Warfarin to be discontinued. Monitor PT and INR. Begin apixaban 5 mg p.o. twice daily. Consult social services to check OP apixaban price.    Chronic combined systolic and diastolic heart failure (HCC) No signs of decompensation. Continue triamterene 18.75 mg p.o. daily. Continue HCTZ 12.5 mg p.o. daily. Continue ramipril 10 mg p.o. daily. Continue on sotalol 80 mg p.o. twice daily. (Sotalol takes place of "beta-blocker"). Follow-up with cardiology as scheduled.    Hyperglycemia Check fasting  glucose in AM. Further work-up depending on results.    Hypercholesterolemia Continue rosuvastatin 10 mg p.o. every other day.    Hypothyroidism Continue levothyroxine 75 mcg p.o. daily.    Benign essential hypertension Continue diuretics, ACE inhibitor and "beta-blocker".    Class 2 obesity Lifestyle modifications. Follow-up with PCP.   Advance Care Planning:   Code Status: Full Code   Consults: Dr. Therisa Doyne [facility]).  Family Communication:   Severity of Illness: The appropriate patient status for this patient is INPATIENT. Inpatient status is judged to be reasonable and necessary in order to provide the required intensity of service to ensure the patient's safety. The patient's presenting symptoms, physical exam findings, and initial radiographic and laboratory data in the context of their chronic comorbidities is felt to place them at high risk for further clinical deterioration. Furthermore, it is not anticipated that the patient will be medically stable for discharge from the hospital within 2 midnights of admission.   * I certify that at the point of admission it is my clinical judgment that the patient will require inpatient hospital care spanning beyond 2 midnights from the point of admission due to high intensity of service, high risk for further deterioration and high frequency of surveillance required.*  Author: Reubin Milan,  MD 04/05/2021 11:59 AM  For on call review [email or web address].   This document was prepared using Dragon voice recognition software and may contain some unintended transcription errors.

## 2021-04-05 NOTE — Consult Note (Signed)
Referring Provider: [facility] Primary Care Physician:  [facility] Primary Gastroenterologist:  [facility] (Former patient of Dr. Cristina Gong)  Reason for Consultation:  Diverticulitis and rectal bleeding  HPI: [name] is a 76 y.o. female medical history significant for atrial fibrillation (on Coumadin) chronic systolic and diastolic heart failure, hypertension, s/p mitral valve annuloplasty pacemaker, presents for evaluation of diverticulitis with rectal bleeding.  Patient states she had been having lower abdominal pain ongoing for the past couple days.  It was progressively getting worse.  She also reports nausea and vomiting.  Had 3 episodes of vomiting yesterday, nonbloody bilious vomit.  She states yesterday when she went to the bathroom she had a loose bowel movement and then noticed there was a tablespoon of bright red blood on the tissue paper.  Had 3 episodes of rectal bleeding on the tissue paper yesterday (3/8).  She states there was no blood in the toilet.  She has never had rectal bleeding before.  Denies previous episodes of diverticulitis before.  Denies fever, though thinks that she had some chills yesterday and the day before. Last dose of coumadin was 3/8 AM.  Denies NSAID use.  Denies alcohol use.  Prior tobacco user, quit in 2006.  Denies unintentional weight loss.  Denies melena.  Denies dysphagia.  Denies heartburn.  Denies family history of colon cancer or other GI issues.   Last colonoscopy 06/2013 with Dr. Cristina Gong.  Diverticulosis, otherwise normal.  Repeat 10 years.  Past Medical History:  Diagnosis Date   Arthritis    Atrial fibrillation [facility])    PAF---currently anticoagulated with Warfarin   Atrial flutter ([place]) 02/21/2004   s/p mitral valve repair and ppm insertion   Congenital heart block 08/26/2003   Diagnosed by Dr. Clovis Fredrickson. A temp orary pacemaker was placed in 1988 during hysterectomy procedure.   Diabetes mellitus without complication  ([place])    prediabetic   Diverticulosis    Dyslipidemia    takes Niacin and Crestor daily   History of blood transfusion    no abnormal reaction noted   Hypothyroidism    takes Synthroid daily   Mitral valve insufficiency    Recieved Mitral valve repair with reconstruction of the anterior leaflet cordis with cortex and ring annuloplasty   Nonischemic cardiomyopathy ([place]) 2006   EF of 35% due to AV dyssyncrony. Has since resolved per most recent ECHO.   RBBB    Due the VP with the LV epicardial lead that was inserted in 2006 during the mitral repair   Systemic hypertension    takes  Ramipril daily-Maxzide every other day   Urinary urgency     Past Surgical History:  Procedure Laterality Date   ABDOMINAL HYSTERECTOMY     BREAST LUMPECTOMY WITH RADIOACTIVE SEED LOCALIZATION Right 11/22/2014   Procedure: RIGHT BREAST LUMPECTOMY WITH RADIOACTIVE SEED LOCALIZATION;  Surgeon: Fanny Skates, MD;  Location: [facility];  Service: General;  Laterality: Right;   CARDIAC CATHETERIZATION  10/20/2003   Minimal decrease in the LV systolic EF of 20-94% with global hyperkinesis. Moderately dilated LV. 3+ Mitral regurgitation. Underfilling of LAD which promptly improved with intracoronary NTG administration. Microvascular angina or microvascular spasm. Otherwise coronaries were normal. No intervention necessary.   CARDIOVASCULAR STRESS TEST  09/14/2003   Performed for the pt's congenital CHB that was discovered during a preoperative evaluation for a colonoscopy.  EKG demonstrated CHB with junctional escape. Nonspecific ST changes noted. Stress ECG was positive for ischemia(2 mm ST depression). Dilated LV  cavity. Inability to calculate systolic function. High risk scan. This scan was followed up with a cardiac cath.   COLONOSCOPY     MITRAL VALVE ANNULOPLASTY  02/06/2004   Performed for severe MR requiring mitral valve repair with anterior leaflet reconstruction and a [place] annuloplasty  ring--model: [id number], and an 80% lesion reduction (Dr. Servando Snare). Two LV leads were placed epicardially just in the event that biv pacing would be needed. (One of the leads had been used for during the ppm insertion recieved one week later)    PERMANENT PACEMAKER GENERATOR CHANGE  07/06/2012   [employer]   PERMANENT PACEMAKER GENERATOR CHANGE N/A 07/06/2012   Procedure: PERMANENT PACEMAKER GENERATOR CHANGE;  Surgeon: Sanda Klein, MD;  Location: [facility] CATH LAB;  Service: Cardiovascular;  Laterality: N/A;   PERMANENT PACEMAKER INSERTION  02/13/2004   PPM implant following a mitral valve annuloplasty. An epicardial active fixation lead (implanted on 02/06/2004 during the mitral valve procedure) was used for this implant.    US ECHOCARDIOGRAPHY  09/10/2010 and1/07/2008   The last echo showed an EF of >55%; RV mildly dilated. Mild to moderately dilated LA  with borderline RA dilation as well. Impaired diastolic function. Normal RV systolic function.MVR without residual insufficiency.No Pericardial effusion.    Prior to Admission medications   Medication Sig Start Date End Date Taking? Authorizing Provider  acetaminophen (TYLENOL) 325 MG tablet Take 650 mg by mouth as needed for pain.    [provider]  cholecalciferol (VITAMIN D) 400 UNITS TABS Take 400 Units by mouth daily.    [provider]  folic acid (FOLVITE) 1 MG tablet Take 1 tablet (1 mg total) by mouth daily. 11/30/20   Croitoru, Mihai, MD  HYDROcodone-acetaminophen (NORCO) 5-325 MG tablet Take 1-2 tablets by mouth every 6 (six) hours as needed for moderate pain or severe pain. Patient not taking: Reported on 01/02/2021 11/22/14   Fanny Skates, MD  levothyroxine (SYNTHROID) 75 MCG tablet Take 1 tablet (75 mcg total) by mouth daily before breakfast. 11/20/18   Croitoru, Mihai, MD  niacin 500 MG tablet Take 500 mg by mouth every evening.     [provider]  Omega-3 Fatty Acids (OMEGA 3 PO) Take 1,000 mg by mouth  daily.    [provider]  ramipril (ALTACE) 10 MG capsule Take 1 capsule (10 mg total) by mouth at bedtime. 07/07/20   Almyra Deforest, PA  rosuvastatin (CRESTOR) 10 MG tablet Take 1 tablet (10 mg total) by mouth daily. 07/07/20   Almyra Deforest, PA  sotalol (BETAPACE) 80 MG tablet TAKE 1 TABLET(80 MG) BY MOUTH TWICE DAILY 08/08/20   Croitoru, Dani Gobble, MD  triamterene-hydrochlorothiazide (MAXZIDE-25) 37.5-25 MG tablet TAKE 1/2 TABLET BY MOUTH EVERY DAY AS DIRECTED 07/07/20   Almyra Deforest, PA  warfarin (COUMADIN) 5 MG tablet TAKE 1 TO 1 AND 1/2 TABLETS BY MOUTH EVERY DAY AS DIRECTED BY COUMADIN CLINIC 08/29/20   Croitoru, Mihai, MD    Scheduled Meds: Continuous Infusions: PRN Meds:.acetaminophen **OR** acetaminophen, ondansetron **OR** ondansetron (ZOFRAN) IV  Allergies as of 04/05/2021   (No Known Allergies)    Family History  Problem Relation Age of Onset   Cancer - Other Mother    Pneumonia Father    Asthma Brother    Healthy Brother    Asthma Sister    Healthy Brother    Healthy Brother    Healthy Brother     Social History   Socioeconomic History   Marital status: Single  Spouse name: Not on file   Number of children: Not on file   Years of education: Not on file   Highest education level: Not on file  Occupational History   Not on file  Tobacco Use   Smoking status: Former    Types: Cigarettes   Smokeless tobacco: Never   Tobacco comments:    quit smoking in 2006  Substance and Sexual Activity   Alcohol use: Yes    Comment: wine occasionally   Drug use: No   Sexual activity: Yes    Birth control/protection: Surgical  Other Topics Concern   Not on file  Social History Narrative   Not on file   Social Determinants of Health   Financial Resource Strain: Not on file  Food Insecurity: Not on file  Transportation Needs: Not on file  Physical Activity: Not on file  Stress: Not on file  Social Connections: Not on file  Intimate Partner Violence: Not on file     Review of Systems: Review of Systems  Constitutional:  Positive for chills. Negative for fever and weight loss.  HENT:  Negative for hearing loss and tinnitus.   Eyes:  Negative for blurred vision and double vision.  Respiratory:  Negative for cough and hemoptysis.   Cardiovascular:  Negative for chest pain and palpitations.  Gastrointestinal:  Positive for abdominal pain, blood in stool, diarrhea, nausea and vomiting. Negative for constipation, heartburn and melena.  Genitourinary:  Negative for dysuria and urgency.  Musculoskeletal:  Negative for myalgias and neck pain.  Skin:  Negative for itching and rash.  Neurological:  Negative for seizures and loss of consciousness.  Psychiatric/Behavioral:  Negative for substance abuse. The patient is not nervous/anxious.     Physical Exam:Physical Exam Constitutional:      Appearance: She is well-developed.  HENT:     Head: Normocephalic and atraumatic.     Nose: Nose normal. No congestion.     Mouth/Throat:     Mouth: Mucous membranes are moist.     Pharynx: Oropharynx is clear.  Eyes:     Extraocular Movements: Extraocular movements intact.     Conjunctiva/sclera: Conjunctivae normal.  Cardiovascular:     Rate and Rhythm: Normal rate and regular rhythm.  Pulmonary:     Effort: Pulmonary effort is normal. No respiratory distress.  Abdominal:     General: Abdomen is flat. Bowel sounds are normal. There is no distension.     Palpations: Abdomen is soft.     Tenderness: There is abdominal tenderness (lower abdomen).  Musculoskeletal:        General: No swelling. Normal range of motion.     Cervical back: Normal range of motion and neck supple.  Skin:    General: Skin is warm.     Coloration: Skin is not jaundiced.  Neurological:     General: No focal deficit present.     Mental Status: She is alert and oriented to person, place, and time.  Psychiatric:        Mood and Affect: Mood normal.        Behavior: Behavior normal.         Thought Content: Thought content normal.        Judgment: Judgment normal.    Vital signs: Vitals:   04/05/21 0340 04/05/21 0530  BP: 129/67 120/67  Pulse: 80 81  Resp: 18 16  Temp:    SpO2: 99% 98%        GI:  Lab Results: Recent Labs  04/05/21 0240  WBC 8.3  HGB 13.7  HCT 42.9  PLT 278   BMET Recent Labs    04/05/21 0240  NA 138  K 3.5  CL 103  CO2 26  GLUCOSE 155*  BUN 13  CREATININE 1.04*  CALCIUM 9.1   LFT Recent Labs    04/05/21 0240  PROT 7.6  ALBUMIN 3.5  AST 15  ALT 15  ALKPHOS 51  BILITOT 0.4   PT/INR Recent Labs    04/05/21 0240  LABPROT 27.6*  INR 2.6*     Studies/Results: CT ABDOMEN PELVIS W CONTRAST  Result Date: 04/05/2021 CLINICAL DATA:  Left lower quadrant pain. EXAM: CT ABDOMEN AND PELVIS WITH CONTRAST TECHNIQUE: Multidetector CT imaging of the abdomen and pelvis was performed using the standard protocol following bolus administration of intravenous contrast. RADIATION DOSE REDUCTION: This exam was performed according to the departmental dose-optimization program which includes automated exposure control, adjustment of the mA and/or kV according to patient size and/or use of iterative reconstruction technique. CONTRAST:  137m OMNIPAQUE IOHEXOL 300 MG/ML  SOLN COMPARISON:  None. FINDINGS: Lower chest: There is a small Bochdalek's fat herniation through the diaphragm into the posteromedial lower left chest. There is scattered linear scarring or atelectasis of the lung bases without infiltrates. Small hiatal hernia. There is mild cardiomegaly. There is metallic artifact along the mid/lower left heart border related to external pacing wiring. Hepatobiliary: Unremarkable liver, gallbladder and bile ducts. No portal vein dilatation. Pancreas: There is no mass enhancement, ductal dilatation or adjacent edema. Spleen: No mass or splenomegaly. Adrenals/Urinary Tract: There is no adrenal mass, no focal renal cortical abnormality and no  evidence for urinary stones or obstruction. The bladder is mildly thickened but not fully distended. Stomach/Bowel: Small hiatal hernia. Unremarkable gastric wall and unopacified small bowel. Normal appendix. Diffuse diverticulosis is seen. 8 cm segment of thickened and inflamed large bowel is seen at the junction of the descending and sigmoid colon with moderate surrounding inflammatory reaction and trace nonlocalizing reactive fluid in the distal left paracolic gutter. There is no adjacent abscess or free air. Advanced diverticulosis continues along the sigmoid segment without additional inflammatory reaction. Vascular/Lymphatic: Aortic atherosclerosis. No enlarged abdominal or pelvic lymph nodes. Reproductive: Status post hysterectomy. No adnexal masses. Other: There are small umbilical and inguinal fat hernias. There is no free hemorrhage, abscess or free air. Only free fluid is the trace amount in the distal left paracolic gutter. Musculoskeletal: There is osteopenia and advanced degenerative disc changes in the lumbar spine, lower thoracic spine with moderate lumbar facet hypertrophy. No worrisome regional skeletal lesion. 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 IMPRESSION: 1. Acute diverticulitis, junction of the distal descending and proximal sigmoid colon. There is adjacent trace nonlocalizing reactive fluid but no free air or abscess. Follow-up colonoscopy recommended after treatment to exclude underlying lesion. 2. Other diffuse colonic diverticula are uncomplicated. 3. Small hiatal hernia.  Small umbilical and inguinal fat hernias. 4. Mild cardiomegaly. 5. Aortic atherosclerosis. 6. Osteopenia and degenerative change. Electronically Signed   By: KTelford NabM.D.   On: 04/05/2021 04:57    Impression: Diverticulitis and rectal bleeding -CT abdomen pelvis with contrast 3/9: Acute diverticulitis at the junction of the distal descending and proximal sigmoid colon.   Nonlocalizing reactive fluid but no free air or abscess.  Small hiatal hernia. - hgb 13.7 - No leukocytosis - Lipase 60 - Renal function stable - positive fecal occult - On IV ceftriaxone and Flagyl  Afib -  on Coumadin (last dose 3/8 AM) - INR 2.6   Plan: Diverticulitis complication at this time.  Stable hemoglobin.  Currently on IV ceftriaxone and Flagyl.  Continue IV antibiotics. No indication for colonoscopy at this time due to current inflammation.  advise colonoscopy in the next 6 to 8 weeks after treatment of diverticulitis is complete for further evaluation. Continue pain management and supportive care Can have clear liquid diet as tolerated [facility] will follow    LOS: 0 days   Zsazsa Bahena Radford Pax  PA-C 04/05/2021, 8:02 AM  Contact #  [phone]

## 2021-04-05 NOTE — TOC Benefit Eligibility Note (Signed)
Transition of Care (TOC) Benefit Eligibility Note  ? ? ?Patient Details  ?Name: [name] ?MRN: [id number] ?Date of Birth: 05-10-1945 ? ? ?Medication/Dose: Eliquis 5 mg bid x 30 days ? ?Covered?: Yes ? ?Tier: 2 Drug ? ?Prescription Coverage Preferred Pharmacy: local ? ?Spoke with Person/Company/Phone Number:: Janene Harvey [employer] [phone] ? ?Co-Pay: $40.00 ? ?Prior Approval: No ? ?Deductible: Met ? ?  ? ? ? ?[name] ?Phone Number: ?04/05/2021, 12:12 PM ? ? ? ? ?

## 2021-04-06 DIAGNOSIS — K5733 Diverticulitis of large intestine without perforation or abscess with bleeding: Secondary | ICD-10-CM | POA: Diagnosis not present

## 2021-04-06 LAB — BASIC METABOLIC PANEL
Anion gap: 8 (ref 5–15)
BUN: 16 mg/dL (ref 8–23)
CO2: 26 mmol/L (ref 22–32)
Calcium: 8.5 mg/dL — ABNORMAL LOW (ref 8.9–10.3)
Chloride: 103 mmol/L (ref 98–111)
Creatinine, Ser: 0.97 mg/dL (ref 0.44–1.00)
GFR, Estimated: >60 mL/min (ref >60)
Glucose, Bld: 91 mg/dL (ref 70–99)
Potassium: 4 mmol/L (ref 3.5–5.1)
Sodium: 137 mmol/L (ref 135–145)

## 2021-04-06 LAB — CBC
HCT: 40 % (ref 36.0–46.0)
Hemoglobin: 12.5 g/dL (ref 12.0–15.0)
MCH: 27.4 pg (ref 26.0–34.0)
MCHC: 31.3 g/dL (ref 30.0–36.0)
MCV: 87.7 fL (ref 80.0–100.0)
Platelets: 234 10*3/uL (ref 150–400)
RBC: 4.56 MIL/uL (ref 3.87–5.11)
RDW: 13.8 % (ref 11.5–15.5)
WBC: 6.5 10*3/uL (ref 4.0–10.5)
nRBC: 0 % (ref 0.0–0.2)

## 2021-04-06 NOTE — Assessment & Plan Note (Signed)
Body mass index is 37.2 kg/m?Marland Kitchen  ?Placing the pt at higher risk of poor outcomes. ?

## 2021-04-06 NOTE — Care Management CC44 (Signed)
Condition Code 44 Documentation Completed ? ?Patient Details  ?Name: [name] ?MRN: [id number] ?Date of Birth: Sep 25, 1945 ? ? ?Condition Code 44 given:  Yes ?Patient signature on Condition Code 44 notice:  Yes ?Documentation of 2 MD's agreement:  Yes ?Code 44 added to claim:  Yes ? ? ? ?Purcell Mouton, RN ?04/06/2021, 2:48 PM ? ?

## 2021-04-06 NOTE — Plan of Care (Signed)

## 2021-04-06 NOTE — Discharge Instructions (Signed)
Information on my medicine - ELIQUIS? (apixaban) ? ?This medication education was reviewed with me or my healthcare representative as part of my discharge preparation.  The pharmacist that spoke with me during my hospital stay was:  Pei-Te Carlise Stofer, [place] ? ?Why was Eliquis? prescribed for you? ?Eliquis? was prescribed for you to reduce the risk of a blood clot forming that can cause a stroke if you have a medical condition called atrial fibrillation (a type of irregular heartbeat). ? ?What do You need to know about Eliquis? ? ?Take your Eliquis? TWICE DAILY - one tablet in the morning and one tablet in the evening with or without food. If you have difficulty swallowing the tablet whole please discuss with your pharmacist how to take the medication safely. ? ?Take Eliquis? exactly as prescribed by your doctor and DO NOT stop taking Eliquis? without talking to the doctor who prescribed the medication.  Stopping may increase your risk of developing a stroke.  Refill your prescription before you run out. ? ?After discharge, you should have regular check-up appointments with your healthcare provider that is prescribing your Eliquis?.  In the future your dose may need to be changed if your kidney function or weight changes by a significant amount or as you get older. ? ?What do you do if you miss a dose? ?If you miss a dose, take it as soon as you remember on the same day and resume taking twice daily.  Do not take more than one dose of ELIQUIS at the same time to make up a missed dose. ? ?Important Safety Information ?A possible side effect of Eliquis? is bleeding. You should call your healthcare provider right away if you experience any of the following: ?Bleeding from an injury or your nose that does not stop. ?Unusual colored urine (red or dark brown) or unusual colored stools (red or black). ?Unusual bruising for unknown reasons. ?A serious fall or if you hit your head (even if there is no bleeding). ? ?Some  medicines may interact with Eliquis? and might increase your risk of bleeding or clotting while on Eliquis?Marland Kitchen To help avoid this, consult your healthcare provider or pharmacist prior to using any new prescription or non-prescription medications, including herbals, vitamins, non-steroidal anti-inflammatory drugs (NSAIDs) and supplements. ? ?This website has more information on Eliquis? (apixaban): http://www.eliquis.com/eliquis/home  ?

## 2021-04-06 NOTE — Progress Notes (Signed)
[facility] Progress Note ? ?ELYNN PATTESON 76 y.o. 12/27/1945 ? ?CC:  Diverticulitis ? ? ?Subjective: ?Patient states her pain is improved compared to yesterday. She states there still a pressure in her lower abdomen, but "it is nothing compared to yesterday."  Has not had any further rectal bleeding, though also has not had a bowel movement since she has been here. Denies nausea/vomiting. States she is tolerating clear liquids, she is not ready to move to full liquids at this time. ? ?ROS : Review of Systems  ?Constitutional:  Negative for chills and fever.  ?Gastrointestinal:  Positive for abdominal pain. Negative for blood in stool, constipation, diarrhea, heartburn, melena, nausea and vomiting.  ?Genitourinary:  Negative for dysuria and urgency.   ? ? ?Objective: ?Vital signs in last 24 hours: ?Vitals:  ? 04/06/21 0208 04/06/21 0555  ?BP: 106/68 116/67  ?Pulse: 67 69  ?Resp: 16 16  ?Temp: 98.4 ?F (36.9 ?C) 98.7 ?F (37.1 ?C)  ?SpO2: 99% 97%  ? ? ?Physical Exam: ? ?General:  Alert, cooperative, no distress, appears stated age  ?Head:  Normocephalic, without obvious abnormality, atraumatic  ?Eyes:  Anicteric sclera, EOM's intact  ?Lungs:   Clear to auscultation bilaterally, respirations unlabored  ?Heart:  Regular rate and rhythm, S1, S2 normal  ?Abdomen:   Soft, mild lower abdominal tenderness. bowel sounds active all four quadrants,  no masses,   ? ? ?Lab Results: ?Recent Labs  ?  04/05/21 ?0240 04/05/21 ?7001 04/06/21 ?0415  ?NA 138  --  137  ?K 3.5  --  4.0  ?CL 103  --  103  ?CO2 26  --  26  ?GLUCOSE 155*  --  91  ?BUN 13  --  16  ?CREATININE 1.04*  --  0.97  ?CALCIUM 9.1  --  8.5*  ?MG  --  2.2  --   ? ?Recent Labs  ?  04/05/21 ?0240  ?AST 15  ?ALT 15  ?ALKPHOS 51  ?BILITOT 0.4  ?PROT 7.6  ?ALBUMIN 3.5  ? ?Recent Labs  ?  04/05/21 ?0240 04/05/21 ?7494 04/05/21 ?2034 04/06/21 ?0415  ?WBC 8.3  --   --  6.5  ?NEUTROABS 6.8  --   --   --   ?HGB 13.7   < > 12.9 12.5  ?HCT 42.9   < > 39.9 40.0  ?MCV  85.3  --   --  87.7  ?PLT 278  --   --  234  ? < > = values in this interval not displayed.  ? ?Recent Labs  ?  04/05/21 ?0240 04/05/21 ?1429  ?LABPROT 27.6* 17.6*  ?INR 2.6* 1.4*  ? ? ? ? ?Assessment ?Diverticulitis and rectal bleeding ?-CT abdomen pelvis with contrast 3/9: Acute diverticulitis at the junction of the distal descending and proximal sigmoid colon.  Nonlocalizing reactive fluid but no free air or abscess.  Small hiatal hernia. ?- hgb 12.5 ?- No leukocytosis ?- Lipase 60 ?- Renal function stable ?- positive fecal occult ?- On IV ceftriaxone and Flagyl ?  ?Afib ?- on Coumadin (last dose 3/8 AM) ?- INR 2.6 ? ? ?Plan: ?No further bleeding at this time, pain is improving with IV abx. ?Continue clear liquid diet as tolerated. ?Continue IV antibiotics ?Will need outpatient colonoscopy in 6-8 weeks ?[facility] will follow ? ?Doreen Garretson Radford Pax PA-C ?04/06/2021, 9:09 AM ? ?Contact #  [phone]  ?

## 2021-04-06 NOTE — Assessment & Plan Note (Signed)
Was on warfarin prior to coming to hospital. ?Admitting provider d/w with Cardiology and pt is now on apixaban,  ?Continue on discharge.  ?Hear rate controlled on betapace. ?

## 2021-04-06 NOTE — Assessment & Plan Note (Signed)
Blood pressure stable. continue current regimen  ?

## 2021-04-06 NOTE — Hospital Course (Signed)
PMH of paroxysmal atrial fibrillation, atrial flutter, mitral valve insufficiency, history of congenital heart block, history of mitral valve repair and PPM insertion, nonischemic cardiomyopathy, chronic combined systolic and diastolic heart failure, type 2 diabetes, diverticulosis, hyperlipidemia, hypothyroidism, hypertension.  ?Presented with diverticulitis without perforation with GI bleed.  ?[name] [facility] consulted.  ?

## 2021-04-06 NOTE — Assessment & Plan Note (Signed)
Continue synthroid.

## 2021-04-06 NOTE — Assessment & Plan Note (Addendum)
Volume status adequate, ?holding triamterene 18.75 mg p.o. daily and HCTZ 12.5 mg p.o. daily. ?Continue ramipril 10 mg p.o. daily. ?Continue on sotalol 80 mg p.o. twice daily. ?monitor ?

## 2021-04-06 NOTE — Assessment & Plan Note (Signed)
Stress related  ?No further work up ?

## 2021-04-06 NOTE — Progress Notes (Signed)
?  Progress Note ? ? ?Patient: [name] PYP:[id number] DOB: 1945-11-14 DOA: 04/05/2021     Hospitalization day: 1 ?DOS: the patient was seen and examined on 04/06/2021 ? ?Brief hospital course: ?PMH of paroxysmal atrial fibrillation, atrial flutter, mitral valve insufficiency, history of congenital heart block, history of mitral valve repair and PPM insertion, nonischemic cardiomyopathy, chronic combined systolic and diastolic heart failure, type 2 diabetes, diverticulosis, hyperlipidemia, hypothyroidism, hypertension.  ?Presented with diverticulitis without perforation with GI bleed.  ?[facility] consulted.  ? ?Assessment and Plan: ?* Diverticulitis of colon with bleeding ?CT abdomen shows Acute diverticulitis, junction of the distal descending and proximal sigmoid colon. ?Gastroenterology consulted.  ?Now on ceftriaxone and flagyl  ?Also has concern with GI bleed although likely from diverticulitis only.  ?Now bleeding and abdominal pain has stopped.  ?GI planning to advance the diet with plan to go home tomorrow with outpt follow up.  ?monitor ? ?Chronic combined systolic and diastolic heart failure ([place]) ?Volume status adequate, ?holding triamterene 18.75 mg p.o. daily and HCTZ 12.5 mg p.o. daily. ?Continue ramipril 10 mg p.o. daily. ?Continue on sotalol 80 mg p.o. twice daily. ?monitor ? ?PAF (paroxysmal atrial fibrillation) ([place]) ?Was on warfarin prior to coming to hospital. ?Admitting provider d/w with Cardiology and pt is now on apixaban,  ?Continue on discharge.  ?Hear rate controlled on betapace. ? ?Hypercholesterolemia ?Continue statin ? ?Hyperglycemia ?Stress related  ?No further work up ? ?Obesity (BMI 30-39.9) ?Body mass index is 37.2 kg/m?Marland Kitchen  ?Placing the pt at higher risk of poor outcomes. ? ?Hypothyroidism ?Continue synthroid  ? ?Benign essential hypertension ?Blood pressure stable. continue current regimen  ? ? ? ? ? ? ? ?Subjective: no acute issues, no nausea, no bleeding, no BM. No  abdominal pain. Minimal oral intake ? ?Physical Exam: ?Vitals:  ? 04/05/21 2111 04/06/21 0208 04/06/21 0555 04/06/21 1335  ?BP: 106/66 106/68 116/67 114/65  ?Pulse: 71 67 69 74  ?Resp: '14 16 16 17  '$ ?Temp: 98.7 ?F (37.1 ?C) 98.4 ?F (36.9 ?C) 98.7 ?F (37.1 ?C) (!) 97.4 ?F (36.3 ?C)  ?TempSrc: Oral Oral Oral Oral  ?SpO2: 98% 99% 97% 97%  ?Weight:      ?Height:      ? ?General: Appear in mild distress; no visible Abnormal Neck Mass Or lumps, Conjunctiva normal ?Cardiovascular: S1 and S2 Present, no Murmur, ?Respiratory: good respiratory effort, Bilateral Air entry present and CTA, no Crackles, no wheezes ?Abdomen: Bowel Sound present, mild diffuse tenderness ?Extremities: no Pedal edema ?Neurology: alert and oriented to time, place, and person ?Gait not checked due to patient safety concerns  ? ?Data Reviewed: ?I have Reviewed nursing notes, Vitals, and Lab results since pt's last encounter. Pertinent lab results CBC and BMP ?I have ordered test including CBC and BMP ?I have reviewed the last note from Gastroenterology,   ? ?Family Communication: none at bedside ? ?Disposition: ?Status is: Observation ? ?Author: ?Berle Mull, MD ?04/06/2021 4:53 PM ? ?For on call review [email or web address] ?

## 2021-04-06 NOTE — Assessment & Plan Note (Signed)
Continue statin. 

## 2021-04-06 NOTE — Care Management Obs Status (Signed)
MEDICARE OBSERVATION STATUS NOTIFICATION ? ? ?Patient Details  ?Name: [name] ?MRN: [id number] ?Date of Birth: [birth year] ? ? ?Medicare Observation Status Notification Given:  Yes ? ? ? ?Purcell Mouton, RN ?04/06/2021, 2:48 PM ?

## 2021-04-06 NOTE — Assessment & Plan Note (Signed)
CT abdomen shows Acute diverticulitis, junction of the distal descending and proximal sigmoid colon. ?Gastroenterology consulted.  ?Now on ceftriaxone and flagyl  ?Also has concern with GI bleed although likely from diverticulitis only.  ?Now bleeding and abdominal pain has stopped.  ?GI planning to advance the diet with plan to go home tomorrow with outpt follow up.  ?monitor ?

## 2021-04-07 DIAGNOSIS — K5733 Diverticulitis of large intestine without perforation or abscess with bleeding: Secondary | ICD-10-CM | POA: Diagnosis not present

## 2021-04-07 LAB — CBC
HCT: 41.1 % (ref 36.0–46.0)
Hemoglobin: 13 g/dL (ref 12.0–15.0)
MCH: 27.2 pg (ref 26.0–34.0)
MCHC: 31.6 g/dL (ref 30.0–36.0)
MCV: 86 fL (ref 80.0–100.0)
Platelets: 264 10*3/uL (ref 150–400)
RBC: 4.78 MIL/uL (ref 3.87–5.11)
RDW: 13.8 % (ref 11.5–15.5)
WBC: 5.1 10*3/uL (ref 4.0–10.5)
nRBC: 0 % (ref 0.0–0.2)

## 2021-04-07 LAB — BASIC METABOLIC PANEL
Anion gap: 10 (ref 5–15)
BUN: 13 mg/dL (ref 8–23)
CO2: 27 mmol/L (ref 22–32)
Calcium: 8.7 mg/dL — ABNORMAL LOW (ref 8.9–10.3)
Chloride: 102 mmol/L (ref 98–111)
Creatinine, Ser: 1.09 mg/dL — ABNORMAL HIGH (ref 0.44–1.00)
GFR, Estimated: 53 mL/min — ABNORMAL LOW (ref >60)
Glucose, Bld: 98 mg/dL (ref 70–99)
Potassium: 3.8 mmol/L (ref 3.5–5.1)
Sodium: 139 mmol/L (ref 135–145)

## 2021-04-07 MED ORDER — AMOXICILLIN-POT CLAVULANATE 875-125 MG PO TABS: 1.0000 | ORAL_TABLET | Freq: Two times a day (BID) | ORAL | 0 refills | Status: DC

## 2021-04-07 MED ORDER — GLUCERNA SHAKE PO LIQD
237.0000 mL | Freq: Three times a day (TID) | ORAL | 0 refills | Status: DC
Start: 2021-04-07 — End: 2021-04-07

## 2021-04-07 MED ORDER — GLUCERNA SHAKE PO LIQD
237.0000 mL | Freq: Three times a day (TID) | ORAL | 0 refills | Status: DC
Start: 2021-04-07 — End: 2022-01-23

## 2021-04-07 MED ORDER — AMOXICILLIN-POT CLAVULANATE 875-125 MG PO TABS: 1.0000 | ORAL_TABLET | Freq: Two times a day (BID) | ORAL | 0 refills | Status: AC

## 2021-04-07 MED ORDER — APIXABAN 5 MG PO TABS
5.0000 mg | ORAL_TABLET | Freq: Two times a day (BID) | ORAL | 0 refills | Status: DC
Start: 2021-04-07 — End: 2021-05-09

## 2021-04-07 MED ORDER — GLUCERNA SHAKE PO LIQD
237.0000 mL | Freq: Three times a day (TID) | ORAL | Status: DC
Start: 2021-04-07 — End: 2021-04-07
  Administered 2021-04-07 (×2): 237 mL via ORAL
  Filled 2021-04-07 (×3): qty 237

## 2021-04-07 MED ORDER — GLUCERNA SHAKE PO LIQD: 237.0000 mL | Freq: Three times a day (TID) | ORAL | 0 refills | Status: DC

## 2021-04-07 MED ORDER — APIXABAN 5 MG PO TABS
5.0000 mg | ORAL_TABLET | Freq: Two times a day (BID) | ORAL | 0 refills | Status: DC
Start: 2021-04-07 — End: 2021-04-07

## 2021-04-07 MED ORDER — APIXABAN 5 MG PO TABS: 5.0000 mg | ORAL_TABLET | Freq: Two times a day (BID) | ORAL | 0 refills | Status: DC

## 2021-04-07 NOTE — Progress Notes (Signed)
Subjective: ?Patient had 1 bowel movement today with blood, and another bowel movement following which was brown in color. ?She no longer has abdominal pain but complains of mild abdominal discomfort. ? ?Objective: ?Vital signs in last 24 hours: ?Temp:  [97.4 ?F (36.3 ?C)-98.7 ?F (37.1 ?C)] 98.2 ?F (36.8 ?C) (03/11 0543) ?Pulse Rate:  [71-74] 71 (03/11 0543) ?Resp:  [16-17] 16 (03/11 0543) ?BP: (114-115)/(65-72) 114/72 (03/11 0543) ?SpO2:  [97 %-98 %] 98 % (03/11 0543) ?Weight change:  ?Last BM Date : 04/07/21 ? ?PE: Cheerful, not in distress ?GENERAL: No pallor, no icterus  ?ABDOMEN: Soft, nondistended, nontender, normoactive bowel sounds ?EXTREMITIES: No deformity, no edema ? ?Lab Results: ?Results for orders placed or performed during the hospital encounter of 04/05/21 (from the past 48 hour(s))  ?Hemoglobin and hematocrit, blood     Status: None  ? Collection Time: 04/05/21  2:29 PM  ?Result Value Ref Range  ? Hemoglobin 13.1 12.0 - 15.0 g/dL  ? HCT 41.4 36.0 - 46.0 %  ?  Comment: Performed at [facility], [place] [address], [place], [place] [address]  ?Protime-INR     Status: Abnormal  ? Collection Time: 04/05/21  2:29 PM  ?Result Value Ref Range  ? Prothrombin Time 17.6 (H) 11.4 - 15.2 seconds  ? INR 1.4 (H) 0.8 - 1.2  ?  Comment: (NOTE) ?INR goal varies based on device and disease states. ?Performed at [facility], [place] [address] ?[place], [place] [address] ?  ?Hemoglobin and hematocrit, blood     Status: None  ? Collection Time: 04/05/21  8:34 PM  ?Result Value Ref Range  ? Hemoglobin 12.9 12.0 - 15.0 g/dL  ? HCT 39.9 36.0 - 46.0 %  ?  Comment: Performed at [facility], [place] [address] [place], [place] [address]  ?CBC     Status: None  ? Collection Time: 04/06/21  4:15 AM  ?Result Value Ref Range  ? WBC 6.5 4.0 - 10.5 K/uL  ? RBC 4.56 3.87 - 5.11 MIL/uL  ? Hemoglobin 12.5 12.0 - 15.0 g/dL  ? HCT 40.0 36.0 - 46.0 %  ? MCV 87.7 80.0 - 100.0 fL  ? MCH  27.4 26.0 - 34.0 pg  ? MCHC 31.3 30.0 - 36.0 g/dL  ? RDW 13.8 11.5 - 15.5 %  ? Platelets 234 150 - 400 K/uL  ? nRBC 0.0 0.0 - 0.2 %  ?  Comment: Performed at [facility], [place] [address] [place], [place] [address]  ?Basic metabolic panel     Status: Abnormal  ? Collection Time: 04/06/21  4:15 AM  ?Result Value Ref Range  ? Sodium 137 135 - 145 mmol/L  ? Potassium 4.0 3.5 - 5.1 mmol/L  ? Chloride 103 98 - 111 mmol/L  ? CO2 26 22 - 32 mmol/L  ? Glucose, Bld 91 70 - 99 mg/dL  ?  Comment: Glucose reference range applies only to samples taken after fasting for at least 8 hours.  ? BUN 16 8 - 23 mg/dL  ? Creatinine, Ser 0.97 0.44 - 1.00 mg/dL  ? Calcium 8.5 (L) 8.9 - 10.3 mg/dL  ? GFR, Estimated >60 >60 mL/min  ?  Comment: (NOTE) ?Calculated using the CKD-EPI Creatinine Equation (2021) ?  ? Anion gap 8 5 - 15  ?  Comment: Performed at [facility], [place] [address] [place], [place] [address]  ?Basic metabolic panel     Status: Abnormal  ? Collection  Time: 04/07/21  5:23 AM  ?Result Value Ref Range  ? Sodium 139 135 - 145 mmol/L  ? Potassium 3.8 3.5 - 5.1 mmol/L  ? Chloride 102 98 - 111 mmol/L  ? CO2 27 22 - 32 mmol/L  ? Glucose, Bld 98 70 - 99 mg/dL  ?  Comment: Glucose reference range applies only to samples taken after fasting for at least 8 hours.  ? BUN 13 8 - 23 mg/dL  ? Creatinine, Ser 1.09 (H) 0.44 - 1.00 mg/dL  ? Calcium 8.7 (L) 8.9 - 10.3 mg/dL  ? GFR, Estimated 53 (L) >60 mL/min  ?  Comment: (NOTE) ?Calculated using the CKD-EPI Creatinine Equation (2021) ?  ? Anion gap 10 5 - 15  ?  Comment: Performed at [facility], [place] [address], [place], [place] [address]  ?CBC     Status: None  ? Collection Time: 04/07/21  5:23 AM  ?Result Value Ref Range  ? WBC 5.1 4.0 - 10.5 K/uL  ? RBC 4.78 3.87 - 5.11 MIL/uL  ? Hemoglobin 13.0 12.0 - 15.0 g/dL  ? HCT 41.1 36.0 - 46.0 %  ? MCV 86.0 80.0 - 100.0 fL  ? MCH 27.2 26.0 - 34.0 pg  ? MCHC 31.6 30.0 - 36.0 g/dL  ? RDW  13.8 11.5 - 15.5 %  ? Platelets 264 150 - 400 K/uL  ? nRBC 0.0 0.0 - 0.2 %  ?  Comment: Performed at [facility], [place] [address], [place],  [address]  ? ? ?Studies/Results: ?No results found. ? ?Medications: I have reviewed the patient's current medications. ? ?Assessment: ?Acute diverticulitis ?Small amount of rectal bleeding, hemoglobin stable at 13 ?Anticoagulation has been changed from warfarin to Eliquis ? ?Mild renal impairment noted, BUN/creatinine/GFR of 13/1.09/53 today compared to 16/0.97/more than 60 yesterday ? ?Plan: ?Diet to be advanced to soft ?Eliquis has been started from 04/05/2021 ?Okay to DC patient home in afternoon if she is able to tolerate soft diet on antibiotics either Augmentin for 10 days or Cipro and Flagyl for 10 days. ?We will schedule for an outpatient colonoscopy in 6 to 8 weeks. ?Discussed the same with the patient and hospitalist Dr. Berle Mull ? ?Ronnette Juniper, MD ?04/07/2021, 11:16 AM ? ?  ?

## 2021-04-08 NOTE — Discharge Summary (Signed)
Physician Discharge Summary   Patient: [name] MRN: [id number] DOB: [birth year]  Admit date:     04/05/2021  Discharge date: 04/07/2021  Discharge Physician: Berle Mull  PCP: [facility]  Recommendations at discharge: Follow-up with PCP as recommended.  Discharge Diagnoses: Principal Problem:   Diverticulitis of colon with bleeding Active Problems:   PAF (paroxysmal atrial fibrillation) (H[facility])   Status post mitral valve annuloplasty   Chronic combined systolic and diastolic heart failure (H[facility])   Hypercholesterolemia   Benign essential hypertension   Hypothyroidism   Obesity (BMI 30-39.9)   Hyperglycemia  Hospital Course: PMH of paroxysmal atrial fibrillation, atrial flutter, mitral valve insufficiency, history of congenital heart block, history of mitral valve repair and PPM insertion, nonischemic cardiomyopathy, chronic combined systolic and diastolic heart failure, type 2 diabetes, diverticulosis, hyperlipidemia, hypothyroidism, hypertension.  Presented with diverticulitis without perforation with GI bleed.  [facility] consulted.   Assessment and Plan: * Diverticulitis of colon with bleeding CT abdomen shows Acute diverticulitis, junction of the distal descending and proximal sigmoid colon. Gastroenterology consulted.  Now on ceftriaxone and flagyl  Also has concern with GI bleed although likely from diverticulitis only.  Now bleeding and abdominal pain has stopped.  Tolerating oral diet.  No further bleeding.  Hemoglobin stable.  Chronic combined systolic and diastolic heart failure (HCC) Volume status adequate, holding triamterene 18.75 mg p.o. daily and HCTZ 12.5 mg p.o. daily. Continue ramipril 10 mg p.o. daily. Continue on sotalol 80 mg p.o. twice daily. monitor  PAF (paroxysmal atrial fibrillation) ([place]) Was on warfarin prior to coming to hospital. Admitting provider d/w with Cardiology and pt is now on apixaban,   Continue on discharge.  Hear rate controlled on betapace.  Hypercholesterolemia Continue statin  Hyperglycemia Stress related  No further work up  Obesity (BMI 30-39.9) Body mass index is 37.2 kg/m. Placing the pt at higher risk of poor outcomes.  Hypothyroidism Continue synthroid   Benign essential hypertension Blood pressure stable. continue current regimen   Consultants: Gastroenterology  Procedures performed:  none DISCHARGE MEDICATION: Allergies as of 04/07/2021   No Known Allergies      Medication List     STOP taking these medications    warfarin 5 MG tablet Commonly known as: COUMADIN       TAKE these medications    acetaminophen 325 MG tablet Commonly known as: TYLENOL Take 650 mg by mouth daily as needed for pain.   amoxicillin-clavulanate 875-125 MG tablet Commonly known as: Augmentin Take 1 tablet by mouth 2 (two) times daily for 10 days.   apixaban 5 MG Tabs tablet Commonly known as: ELIQUIS Take 1 tablet (5 mg total) by mouth 2 (two) times daily.   cholecalciferol 10 MCG (400 UNIT) Tabs tablet Commonly known as: VITAMIN D3 Take 400 Units by mouth daily.   feeding supplement (GLUCERNA SHAKE) Liqd Take 237 mLs by mouth 3 (three) times daily between meals.   folic acid 1 MG tablet Commonly known as: FOLVITE Take 1 tablet (1 mg total) by mouth daily.   levothyroxine 75 MCG tablet Commonly known as: SYNTHROID Take 1 tablet (75 mcg total) by mouth daily before breakfast.   niacin 500 MG tablet Take 500 mg by mouth every evening.   OMEGA 3 PO Take 1,000 mg by mouth daily.   ramipril 10 MG capsule Commonly known as: ALTACE Take 1 capsule (10 mg total) by mouth at bedtime.   rosuvastatin 10 MG tablet Commonly known as: CRESTOR Take 1  tablet (10 mg total) by mouth daily. What changed: when to take this   sotalol 80 MG tablet Commonly known as: BETAPACE TAKE 1 TABLET(80 MG) BY MOUTH TWICE DAILY What changed: See the new  instructions.   triamterene-hydrochlorothiazide 37.5-25 MG tablet Commonly known as: MAXZIDE-25 TAKE 1/2 TABLET BY MOUTH EVERY DAY AS DIRECTED What changed:  how much to take how to take this when to take this additional instructions        Follow-up Information     [facility]. Schedule an appointment as soon as possible for a visit in 1 week(s).   Contact information: [address] [place] [address] [phone]         Sanda Klein, MD .   Specialty: Cardiology Contact information: [address] [place] [address] [phone]                Disposition: Home Diet recommendation:  Discharge Diet Orders (From admission, onward)     Start     Ordered   04/07/21 0000  Diet - low sodium heart healthy        04/07/21 1537           Discharge Exam: Filed Weights   04/05/21 0200  Weight: 95.3 kg   General: Appear in none distress; no visible Abnormal Neck Mass Or lumps, Conjunctiva normal Cardiovascular: S1 and S2 Present, no Murmur, Respiratory: good respiratory effort, Bilateral Air entry present and CTA, no Crackles, no wheezes Abdomen: Bowel Sound present nontender Extremities: no Pedal edema Neurology: alert and oriented to time, place, and person Gait not checked due to patient safety concerns   Condition at discharge: good  The results of significant diagnostics from this hospitalization (including imaging, microbiology, ancillary and laboratory) are listed below for reference.   Imaging Studies: CT ABDOMEN PELVIS W CONTRAST  Result Date: 04/05/2021 CLINICAL DATA:  Left lower quadrant pain. EXAM: CT ABDOMEN AND PELVIS WITH CONTRAST TECHNIQUE: Multidetector CT imaging of the abdomen and pelvis was performed using the standard protocol following bolus administration of intravenous contrast. RADIATION DOSE REDUCTION: This exam was performed according to the departmental  dose-optimization program which includes automated exposure control, adjustment of the mA and/or kV according to patient size and/or use of iterative reconstruction technique. CONTRAST:  167m OMNIPAQUE IOHEXOL 300 MG/ML  SOLN COMPARISON:  None. FINDINGS: Lower chest: There is a small Bochdalek's fat herniation through the diaphragm into the posteromedial lower left chest. There is scattered linear scarring or atelectasis of the lung bases without infiltrates. Small hiatal hernia. There is mild cardiomegaly. There is metallic artifact along the mid/lower left heart border related to external pacing wiring. Hepatobiliary: Unremarkable liver, gallbladder and bile ducts. No portal vein dilatation. Pancreas: There is no mass enhancement, ductal dilatation or adjacent edema. Spleen: No mass or splenomegaly. Adrenals/Urinary Tract: There is no adrenal mass, no focal renal cortical abnormality and no evidence for urinary stones or obstruction. The bladder is mildly thickened but not fully distended. Stomach/Bowel: Small hiatal hernia. Unremarkable gastric wall and unopacified small bowel. Normal appendix. Diffuse diverticulosis is seen. 8 cm segment of thickened and inflamed large bowel is seen at the junction of the descending and sigmoid colon with moderate surrounding inflammatory reaction and trace nonlocalizing reactive fluid in the distal left paracolic gutter. There is no adjacent abscess or free air. Advanced diverticulosis continues along the sigmoid segment without additional inflammatory reaction. Vascular/Lymphatic: Aortic atherosclerosis. No enlarged abdominal or pelvic lymph nodes.  Reproductive: Status post hysterectomy. No adnexal masses. Other: There are small umbilical and inguinal fat hernias. There is no free hemorrhage, abscess or free air. Only free fluid is the trace amount in the distal left paracolic gutter. Musculoskeletal: There is osteopenia and advanced degenerative disc changes in the lumbar  spine, lower thoracic spine with moderate lumbar facet hypertrophy. No worrisome regional skeletal lesion. 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 IMPRESSION: 1. Acute diverticulitis, junction of the distal descending and proximal sigmoid colon. There is adjacent trace nonlocalizing reactive fluid but no free air or abscess. Follow-up colonoscopy recommended after treatment to exclude underlying lesion. 2. Other diffuse colonic diverticula are uncomplicated. 3. Small hiatal hernia.  Small umbilical and inguinal fat hernias. 4. Mild cardiomegaly. 5. Aortic atherosclerosis. 6. Osteopenia and degenerative change. Electronically Signed   By: Telford Nab M.D.   On: 04/05/2021 04:57    Microbiology: Results for orders placed or performed during the hospital encounter of 04/05/21  Resp Panel by RT-PCR (Flu A&B, Covid) Nasopharyngeal Swab     Status: None   Collection Time: 04/05/21  5:37 AM   Specimen: Nasopharyngeal Swab; Nasopharyngeal(NP) swabs in vial transport medium  Result Value Ref Range Status   SARS Coronavirus 2 by RT PCR NEGATIVE NEGATIVE Final    Comment: (NOTE) SARS-CoV-2 target nucleic acids are NOT DETECTED.  The SARS-CoV-2 RNA is generally detectable in upper respiratory specimens during the acute phase of infection. The lowest concentration of SARS-CoV-2 viral copies this assay can detect is 138 copies/mL. A negative result does not preclude SARS-Cov-2 infection and should not be used as the sole basis for treatment or other patient management decisions. A negative result may occur with  improper specimen collection/handling, submission of specimen other than nasopharyngeal swab, presence of viral mutation(s) within the areas targeted by this assay, and inadequate number of viral copies(<138 copies/mL). A negative result must be combined with clinical observations, patient history, and epidemiological information. The expected result is  Negative.  Fact Sheet for Patients:  EntrepreneurPulse.com.au  Fact Sheet for Healthcare Providers:  IncredibleEmployment.be  This test is no t yet approved or cleared by the [place] FDA and  has been authorized for detection and/or diagnosis of SARS-CoV-2 by FDA under an Emergency Use Authorization (EUA). This EUA will remain  in effect (meaning this test can be used) for the duration of the COVID-19 declaration under Section 564(b)(1) of the Act, 21 U.S.C.section 360bbb-3(b)(1), unless the authorization is terminated  or revoked sooner.       Influenza A by PCR NEGATIVE NEGATIVE Final   Influenza B by PCR NEGATIVE NEGATIVE Final    Comment: (NOTE) The Xpert Xpress SARS-CoV-2/FLU/RSV plus assay is intended as an aid in the diagnosis of influenza from Nasopharyngeal swab specimens and should not be used as a sole basis for treatment. Nasal washings and aspirates are unacceptable for Xpert Xpress SARS-CoV-2/FLU/RSV testing.  Fact Sheet for Patients: EntrepreneurPulse.com.au  Fact Sheet for Healthcare Providers: IncredibleEmployment.be  This test is not yet approved or cleared by the [place] FDA and has been authorized for detection and/or diagnosis of SARS-CoV-2 by FDA under an Emergency Use Authorization (EUA). This EUA will remain in effect (meaning this test can be used) for the duration of the COVID-19 declaration under Section 564(b)(1) of the Act, 21 U.S.C. section 360bbb-3(b)(1), unless the authorization is terminated or revoked.  Performed at [facility], [place] [address], [place], [place] [address]     Labs: CBC:  Recent Labs  Lab 04/05/21 0240 04/05/21 0940 04/05/21 1429 04/05/21 2034 04/06/21 0415 04/07/21 0523  WBC 8.3  --   --   --  6.5 5.1  NEUTROABS 6.8  --   --   --   --   --   HGB 13.7 13.2 13.1 12.9 12.5 13.0  HCT 42.9 42.0 41.4 39.9 40.0 41.1   MCV 85.3  --   --   --  87.7 86.0  PLT 278  --   --   --  234 409   Basic Metabolic Panel: Recent Labs  Lab 04/05/21 0240 04/05/21 0940 04/06/21 0415 04/07/21 0523  NA 138  --  137 139  K 3.5  --  4.0 3.8  CL 103  --  103 102  CO2 26  --  26 27  GLUCOSE 155*  --  91 98  BUN 13  --  16 13  CREATININE 1.04*  --  0.97 1.09*  CALCIUM 9.1  --  8.5* 8.7*  MG  --  2.2  --   --    Liver Function Tests: Recent Labs  Lab 04/05/21 0240  AST 15  ALT 15  ALKPHOS 51  BILITOT 0.4  PROT 7.6  ALBUMIN 3.5   CBG: No results for input(s): GLUCAP in the last 168 hours.  Discharge time spent: greater than 30 minutes.  Signed: Berle Mull, MD [facility] 04/07/2021

## 2021-04-17 ENCOUNTER — Telehealth: Payer: Self-pay

## 2021-04-17 NOTE — Telephone Encounter (Signed)
? ?  Pre-operative Risk Assessment  ?  ?Patient Name: [name]  ?DOB: October 05, 1945 ?MRN: [id number]  ? ?  ? ?Request for Surgical Clearance   ? ?Procedure:   COLONOSCOPY ? ?Date of Surgery:  Clearance 07/17/21                              ?   ?Surgeon:  DR Therisa Doyne ?Surgeon's Group or Practice Name:  [facility] ?Phone number:  [phone] ?Fax number:  [phone] ?  ?Type of Clearance Requested:   ?- Medical  ?- Pharmacy:  Hold Apixaban (Eliquis)   ?  ?Type of Anesthesia:   PROPOFOL ?  ?Additional requests/questions:   ? ? ?

## 2021-04-18 NOTE — Telephone Encounter (Signed)
Preoperative team, please contact this patient and set up a phone call appointment for further cardiac evaluation.  Thank you for your help. ? ?Jossie Ng. Gurtej Noyola NP-C ? ?  ?04/18/2021, 9:19 AM ?[place] ?[address] ?Office [phone] Fax [phone] ? ?

## 2021-04-18 NOTE — Telephone Encounter (Signed)
Wait until late April or May for tele appt ?

## 2021-04-18 NOTE — Telephone Encounter (Signed)
Procedure is not until 07/17/21 ?

## 2021-04-18 NOTE — Telephone Encounter (Signed)
Patient with diagnosis of PAF on warfarin for anticoagulation.  Patient has had atrial appendage clipped. ? ?Procedure: colonoscopy ?Date of procedure: 07/17/21 ? ? ?CHA2DS2-VASc Score = 5  ?This indicates a 7.2% annual risk of stroke. ?The patient's score is based upon: ?CHF History: 1 ?HTN History: 1 ?Diabetes History: 1 ?Stroke History: 0 ?Vascular Disease History: 0 ?Age Score: 1 ?Gender Score: 1 ?  ? ?CrCl 49 ml/min using adjusted body weight ?Platelet count 264K ? ?Per office protocol, patient can hold warfarin for 5 days prior to procedure.   ?

## 2021-04-20 NOTE — Telephone Encounter (Signed)
Late entry. I left a message for the pt earlier today to please call the office to set up a tele appt.  ?

## 2021-04-23 NOTE — Telephone Encounter (Signed)
Left message for the pt to call the office so to set up tele pre op appt.  ?

## 2021-04-26 ENCOUNTER — Encounter: Payer: Self-pay | Admitting: *Deleted

## 2021-04-26 NOTE — Telephone Encounter (Signed)
Our office haas tried x 3 to reach the pt schedule a tele pre op appt. I will send a letter today as well. I will update the requesting office that the pt will need an appt and we have not been able to reach her.  ?

## 2021-04-26 NOTE — Telephone Encounter (Signed)
Ltr sent thru [place]. I will remove from the pool at this time as we have tried to reach the x 3.  ?

## 2021-05-09 ENCOUNTER — Other Ambulatory Visit: Payer: Self-pay

## 2021-05-09 ENCOUNTER — Telehealth: Payer: Self-pay | Admitting: Cardiovascular Disease

## 2021-05-09 ENCOUNTER — Telehealth: Payer: Self-pay | Admitting: *Deleted

## 2021-05-09 MED ORDER — APIXABAN 5 MG PO TABS: 5.0000 mg | ORAL_TABLET | Freq: Two times a day (BID) | ORAL | 1 refills | Status: DC

## 2021-05-09 NOTE — Telephone Encounter (Signed)
Patient with diagnosis of PAF on Elquis for anticoagulation.  Patient has had atrial appendage clipped. ?  ?Procedure: colonoscopy ?Date of procedure: 07/17/21 ?  ?  ?CHA2DS2-VASc Score = 5  ?This indicates a 7.2% annual risk of stroke. ?The patient's score is based upon: ?CHF History: 1 ?HTN History: 1 ?Diabetes History: 1 ?Stroke History: 0 ?Vascular Disease History: 0 ?Age Score: 1 ?Gender Score: 1 ?  ?  ?CrCl 49 ml/min using adjusted body weight ?Platelet count 264K ?  ?Per office protocol, patient can hold Eliquis for 2 days prior to procedure ?

## 2021-05-09 NOTE — Telephone Encounter (Signed)
? ?  Pt called today regarding her coumadin appt. She said she is no longer taking it since last month, when she was in the hospital they changed her coumadin to Eliquis. Spoke with Legrand Como in coumadin clinic and cancelled pt's appt today.  ?

## 2021-05-09 NOTE — Telephone Encounter (Signed)
Message came to me that the pt cancelled her appt with the CVRR clinic as it was stated the pt is on Eliquis and not Coumadin.  ? ?I called the pt back and did tell her that I will pass the message onto the CVRR clinic, if they have any questions they will call her back, pt agreeable.  ? ?I did tell the pt though that I had been trying to reach her as her cardiologist has been asked to give cardiac clearance for colonoscopy with Dr. Therisa Doyne. I explained that she needs a tele pre op appt. I have scheduled tele pre op appt 06/11/21 @ 11:40. Med rec and consent have been done.  ? ?  ?Patient Consent for Virtual Visit  ? ? ?   ? ?MARGRET MOAT has provided verbal consent on 05/09/2021 for a virtual visit (video or telephone). ? ? ?CONSENT FOR VIRTUAL VISIT FOR:  [name]  ?By participating in this virtual visit I agree to the following: ? ?I hereby voluntarily request, consent and authorize [facility] and its employed or contracted physicians, physician assistants, nurse practitioners or other licensed health care professionals (the Practitioner), to provide me with telemedicine health care services (the ?Services") as deemed necessary by the treating Practitioner. I acknowledge and consent to receive the Services by the Practitioner via telemedicine. I understand that the telemedicine visit will involve communicating with the Practitioner through live audiovisual communication technology and the disclosure of certain medical information by electronic transmission. I acknowledge that I have been given the opportunity to request an in-person assessment or other available alternative prior to the telemedicine visit and am voluntarily participating in the telemedicine visit. ? ?I understand that I have the right to withhold or withdraw my consent to the use of telemedicine in the course of my care at any time, without affecting my right to future care or treatment, and that the Practitioner or I may terminate the  telemedicine visit at any time. I understand that I have the right to inspect all information obtained and/or recorded in the course of the telemedicine visit and may receive copies of available information for a reasonable fee.  I understand that some of the potential risks of receiving the Services via telemedicine include:  ?Delay or interruption in medical evaluation due to technological equipment failure or disruption; ?Information transmitted may not be sufficient (e.g. poor resolution of images) to allow for appropriate medical decision making by the Practitioner; and/or  ?In rare instances, security protocols could fail, causing a breach of personal health information. ? ?Furthermore, I acknowledge that it is my responsibility to provide information about my medical history, conditions and care that is complete and accurate to the best of my ability. I acknowledge that Practitioner's advice, recommendations, and/or decision may be based on factors not within their control, such as incomplete or inaccurate data provided by me or distortions of diagnostic images or specimens that may result from electronic transmissions. I understand that the practice of medicine is not an exact science and that Practitioner makes no warranties or guarantees regarding treatment outcomes. I acknowledge that a copy of this consent can be made available to me via my patient portal ([facility]), or I can request a printed copy by calling the office of [facility].   ? ?I understand that my insurance will be billed for this visit.  ? ?I have read or had this consent read to me. ?I understand the contents of this consent, which  adequately explains the benefits and risks of the Services being provided via telemedicine.  ?I have been provided ample opportunity to ask questions regarding this consent and the Services and have had my questions answered to my satisfaction. ?I give my informed consent for the services to be  provided through the use of telemedicine in my medical care ? ? ? ?

## 2021-05-09 NOTE — Telephone Encounter (Signed)
Message came to me that the pt cancelled her appt with the CVRR clinic as it was stated the pt is on Eliquis and not Coumadin.  ?  ?I called the pt back and did tell her that I will pass the message onto the CVRR clinic, if they have any questions they will call her back, pt agreeable.  ?  ?I did tell the pt though that I had been trying to reach her as her cardiologist has been asked to give cardiac clearance for colonoscopy with Dr. Therisa Doyne. I explained that she needs a tele pre op appt. I have scheduled tele pre op appt 06/11/21 @ 11:40. Med rec and consent have been done.  ?  ?

## 2021-05-09 NOTE — Telephone Encounter (Signed)
Prescription refill request for Eliquis received. ?Indication:Afib ?Last office visit:12/22 ?Scr:1.0 ?Age: 76 ?Weight:95.3 kg ? ?Prescription refilled ? ?

## 2021-05-09 NOTE — Telephone Encounter (Signed)
?*  STAT* If patient is at the pharmacy, call can be transferred to refill team. ? ? ?1. Which medications need to be refilled? (please list name of each medication and dose if known)  ?apixaban (ELIQUIS) 5 MG TABS tablet ? ?2. Which pharmacy/location (including street and city if local pharmacy) is medication to be sent to? ? ?[place], [place] - [place] ? ?3. Do they need a 30 day or 90 day supply?  ?90 day supply  ? ?Patient states she is completely out of medication. ? ?

## 2021-05-14 ENCOUNTER — Ambulatory Visit (INDEPENDENT_AMBULATORY_CARE_PROVIDER_SITE_OTHER): Payer: Medicare PPO

## 2021-05-14 DIAGNOSIS — I442 Atrioventricular block, complete: Secondary | ICD-10-CM | POA: Diagnosis not present

## 2021-05-15 LAB — CUP PACEART REMOTE DEVICE CHECK
Battery Impedance: 1897 Ohm
Battery Remaining Longevity: 30 mo
Battery Voltage: 2.76 V
Brady Statistic AP VP Percent: 25 %
Brady Statistic AP VS Percent: 0 %
Brady Statistic AS VP Percent: 75 %
Brady Statistic AS VS Percent: 0 %
Date Time Interrogation Session: 20230417080327
Implantable Lead Implant Date: 20060109
Implantable Lead Implant Date: 20060116
Implantable Lead Location: 753858
Implantable Lead Location: 753859
Implantable Lead Model: 5071
Implantable Lead Model: 5076
Implantable Pulse Generator Implant Date: 20140609
Lead Channel Impedance Value: 405 Ohm
Lead Channel Impedance Value: 451 Ohm
Lead Channel Pacing Threshold Amplitude: 1 V
Lead Channel Pacing Threshold Amplitude: 1.375 V
Lead Channel Pacing Threshold Pulse Width: 0.4 ms
Lead Channel Pacing Threshold Pulse Width: 0.4 ms
Lead Channel Setting Pacing Amplitude: 2 V
Lead Channel Setting Pacing Amplitude: 2.75 V
Lead Channel Setting Pacing Pulse Width: 0.4 ms
Lead Channel Setting Sensing Sensitivity: 4 mV

## 2021-05-30 NOTE — Progress Notes (Signed)
Remote pacemaker transmission.   

## 2021-06-11 ENCOUNTER — Ambulatory Visit (INDEPENDENT_AMBULATORY_CARE_PROVIDER_SITE_OTHER): Payer: Medicare PPO | Admitting: Nurse Practitioner

## 2021-06-11 DIAGNOSIS — Z01810 Encounter for preprocedural cardiovascular examination: Secondary | ICD-10-CM

## 2021-06-11 NOTE — Progress Notes (Signed)
? ?  Patient Name: [name]  ?DOB: 03/11/1945 ?MRN: [id number] ? ?Primary Cardiologist: Sanda Klein, MD ? ?Attempted to contact patient x3 on 06/11/2021 for scheduled virtual visit for preoperative cardiac evaluation. No answer. Julaine Hua, CMA left voicemail notifying patient of need to reschedule appointment.  ? ? ?Lenna Sciara, NP ?06/11/2021, 12:05 PM ? ? ? ?

## 2021-07-03 NOTE — Telephone Encounter (Signed)
I left a message for the patient to return my call to schedule patient for a pre-op tele visit.

## 2021-07-03 NOTE — Telephone Encounter (Signed)
Patient calling in to say the clearance need to be sent over to the dr office. Please advise

## 2021-07-04 ENCOUNTER — Telehealth: Payer: Self-pay | Admitting: *Deleted

## 2021-07-04 NOTE — Telephone Encounter (Signed)
Pt agreeable to plan of care for tele pre op appt 07/05/21 @ 11:40. Med rec and consent are done.

## 2021-07-04 NOTE — Telephone Encounter (Signed)
Pt agreeable to plan of care for tele pre op appt 07/05/21 @ 11:40. Med rec and consent are done.     Patient Consent for Virtual Visit        [name] has provided verbal consent on 07/04/2021 for a virtual visit (video or telephone).   CONSENT FOR VIRTUAL VISIT FOR:  [name]  By participating in this virtual visit I agree to the following:  I hereby voluntarily request, consent and authorize [facility] and its employed or contracted physicians, physician assistants, nurse practitioners or other licensed health care professionals (the Practitioner), to provide me with telemedicine health care services (the "Services") as deemed necessary by the treating Practitioner. I acknowledge and consent to receive the Services by the Practitioner via telemedicine. I understand that the telemedicine visit will involve communicating with the Practitioner through live audiovisual communication technology and the disclosure of certain medical information by electronic transmission. I acknowledge that I have been given the opportunity to request an in-person assessment or other available alternative prior to the telemedicine visit and am voluntarily participating in the telemedicine visit.  I understand that I have the right to withhold or withdraw my consent to the use of telemedicine in the course of my care at any time, without affecting my right to future care or treatment, and that the Practitioner or I may terminate the telemedicine visit at any time. I understand that I have the right to inspect all information obtained and/or recorded in the course of the telemedicine visit and may receive copies of available information for a reasonable fee.  I understand that some of the potential risks of receiving the Services via telemedicine include:  Delay or interruption in medical evaluation due to technological equipment failure or disruption; Information transmitted may not be sufficient (e.g.  poor resolution of images) to allow for appropriate medical decision making by the Practitioner; and/or  In rare instances, security protocols could fail, causing a breach of personal health information.  Furthermore, I acknowledge that it is my responsibility to provide information about my medical history, conditions and care that is complete and accurate to the best of my ability. I acknowledge that Practitioner's advice, recommendations, and/or decision may be based on factors not within their control, such as incomplete or inaccurate data provided by me or distortions of diagnostic images or specimens that may result from electronic transmissions. I understand that the practice of medicine is not an exact science and that Practitioner makes no warranties or guarantees regarding treatment outcomes. I acknowledge that a copy of this consent can be made available to me via my patient portal ([facility]), or I can request a printed copy by calling the office of [facility].    I understand that my insurance will be billed for this visit.   I have read or had this consent read to me. I understand the contents of this consent, which adequately explains the benefits and risks of the Services being provided via telemedicine.  I have been provided ample opportunity to ask questions regarding this consent and the Services and have had my questions answered to my satisfaction. I give my informed consent for the services to be provided through the use of telemedicine in my medical care  .

## 2021-07-05 ENCOUNTER — Ambulatory Visit (INDEPENDENT_AMBULATORY_CARE_PROVIDER_SITE_OTHER): Payer: Medicare PPO | Admitting: Nurse Practitioner

## 2021-07-05 DIAGNOSIS — Z01810 Encounter for preprocedural cardiovascular examination: Secondary | ICD-10-CM

## 2021-07-05 NOTE — Progress Notes (Signed)
Virtual Visit via Telephone Note   Because of [name]'s co-morbid illnesses, she is at least at moderate risk for complications without adequate follow up.  This format is felt to be most appropriate for this patient at this time.  The patient did not have access to video technology/had technical difficulties with video requiring transitioning to audio format only (telephone).  All issues noted in this document were discussed and addressed.  No physical exam could be performed with this format.  Please refer to the patient's chart for her consent to telehealth for [facility].  Evaluation Performed:  Preoperative cardiovascular risk assessment _____________   Date:  07/05/2021   Patient ID:  [name], DOB [birth year], MRN [id number] Patient Location:  Home Provider location:   Office  Primary Care Provider:  [facility] Primary Cardiologist:  Sanda Klein, MD  Chief Complaint / Patient Profile   76 y.o. y/o female with a h/o paroxysmal atrial fibrillation s/p L AA clipping 2006 on Eliquis, chronic combined systolic and diastolic heart failure, complete heart block s/p PPM, severe mitral valve regurgitation s/p mitral valve repair 2006, hypertension, hyperlipidemia, type 2 diabetes, and hypothyroidism who is pending colonoscopy scheduled for 07/17/2021 with Dr. Therisa Todd of [facility], and presents today for telephonic preoperative cardiovascular risk assessment.  Past Medical History    Past Medical History:  Diagnosis Date   Arthritis    Atrial fibrillation ([place])    PAF---currently anticoagulated with Warfarin   Atrial flutter ([place]) 02/21/2004   s/p mitral valve repair and ppm insertion   Congenital heart block 08/26/2003   Diagnosed by Dr. Clovis Fredrickson. A temp orary pacemaker was placed in 1988 during hysterectomy procedure.   Diabetes mellitus without complication ([place])    prediabetic   Diverticulosis    Dyslipidemia    takes  Niacin and Crestor daily   History of blood transfusion    no abnormal reaction noted   Hypothyroidism    takes Synthroid daily   Mitral valve insufficiency    Recieved Mitral valve repair with reconstruction of the anterior leaflet cordis with cortex and ring annuloplasty   Nonischemic cardiomyopathy ([place]) 2006   EF of 35% due to AV dyssyncrony. Has since resolved per most recent ECHO.   RBBB    Due the VP with the LV epicardial lead that was inserted in 2006 during the mitral repair   Systemic hypertension    takes  Ramipril daily-Maxzide every other day   Urinary urgency    Past Surgical History:  Procedure Laterality Date   ABDOMINAL HYSTERECTOMY     BREAST LUMPECTOMY WITH RADIOACTIVE SEED LOCALIZATION Right 11/22/2014   Procedure: RIGHT BREAST LUMPECTOMY WITH RADIOACTIVE SEED LOCALIZATION;  Surgeon: Michelle Skates, MD;  Location: [facility];  Service: General;  Laterality: Right;   CARDIAC CATHETERIZATION  10/20/2003   Minimal decrease in the LV systolic EF of 27-25% with global hyperkinesis. Moderately dilated LV. 3+ Mitral regurgitation. Underfilling of LAD which promptly improved with intracoronary NTG administration. Microvascular angina or microvascular spasm. Otherwise coronaries were normal. No intervention necessary.   CARDIOVASCULAR STRESS TEST  09/14/2003   Performed for the pt's congenital CHB that was discovered during a preoperative evaluation for a colonoscopy.  EKG demonstrated CHB with junctional escape. Nonspecific ST changes noted. Stress ECG was positive for ischemia(2 mm ST depression). Dilated LV cavity. Inability to calculate systolic function. High risk scan. This scan was followed up with a cardiac cath.   COLONOSCOPY  MITRAL VALVE ANNULOPLASTY  02/06/2004   Performed for severe MR requiring mitral valve repair with anterior leaflet reconstruction and a Russell annuloplasty ring--model: [id number], and an 80% lesion reduction (Dr. Servando Snare). Two  LV leads were placed epicardially just in the event that biv pacing would be needed. (One of the leads had been used for during the ppm insertion recieved one week later)    PERMANENT PACEMAKER GENERATOR CHANGE  07/06/2012   [employer]tronic   PERMANENT PACEMAKER GENERATOR CHANGE N/A 07/06/2012   Procedure: PERMANENT PACEMAKER GENERATOR CHANGE;  Surgeon: Sanda Klein, MD;  Location: [facility];  Service: Cardiovascular;  Laterality: N/A;   PERMANENT PACEMAKER INSERTION  02/13/2004   PPM implant following a mitral valve annuloplasty. An epicardial active fixation lead (implanted on 02/06/2004 during the mitral valve procedure) was used for this implant.    US ECHOCARDIOGRAPHY  09/10/2010 and1/07/2008   The last echo showed an EF of >55%; RV mildly dilated. Mild to moderately dilated LA  with borderline RA dilation as well. Impaired diastolic function. Normal RV systolic function.MVR without residual insufficiency.No Pericardial effusion.    Allergies  No Known Allergies  History of Present Illness    [name] is a 76 y.o. female who presents via audio/video conferencing for a telehealth visit today.  Pt was last seen in cardiology clinic on 01/02/2021 by Dr. Sallyanne Todd. At that time [name] was doing well from a cardiac standpoint. The patient is now pending procedure as outlined above. Since her last visit, she has done well from a cardiac standpoint.  She remains active, does housework, and exercises regularly. She denies chest pain, palpitations, dyspnea, pnd, orthopnea, n, v, dizziness, syncope, edema, weight gain, or early satiety. All other systems reviewed and are otherwise negative except as noted above.  Overall, she reports feeling well denies any new symptoms or concerns today.  Home Medications    Prior to Admission medications   Medication Sig Start Date End Date Taking? Authorizing Provider  acetaminophen (TYLENOL) 325 MG tablet Take 650 mg by mouth daily as needed for pain.     [provider]  apixaban (ELIQUIS) 5 MG TABS tablet Take 1 tablet (5 mg total) by mouth 2 (two) times daily. 05/09/21   Croitoru, Mihai, MD  cholecalciferol (VITAMIN D) 400 UNITS TABS Take 400 Units by mouth daily.    [provider]  feeding supplement, GLUCERNA SHAKE, (GLUCERNA SHAKE) LIQD Take 237 mLs by mouth 3 (three) times daily between meals. 04/07/21   Lavina Hamman, MD  folic acid (FOLVITE) 1 MG tablet Take 1 tablet (1 mg total) by mouth daily. 11/30/20   Croitoru, Mihai, MD  levothyroxine (SYNTHROID) 75 MCG tablet Take 1 tablet (75 mcg total) by mouth daily before breakfast. 11/20/18   Croitoru, Mihai, MD  niacin 500 MG tablet Take 500 mg by mouth every evening.     [provider]  Omega-3 Fatty Acids (OMEGA 3 PO) Take 1,000 mg by mouth daily.    [provider]  ramipril (ALTACE) 10 MG capsule Take 1 capsule (10 mg total) by mouth at bedtime. 07/07/20   Almyra Deforest, PA  rosuvastatin (CRESTOR) 10 MG tablet Take 1 tablet (10 mg total) by mouth daily. Patient taking differently: Take 10 mg by mouth every other day. 07/07/20   Almyra Deforest, PA  sotalol (BETAPACE) 80 MG tablet TAKE 1 TABLET(80 MG) BY MOUTH TWICE DAILY Patient taking differently: Take 80 mg by mouth 2 (two) times  daily. 08/08/20   Croitoru, Dani Gobble, MD  triamterene-hydrochlorothiazide (MAXZIDE-25) 37.5-25 MG tablet TAKE 1/2 TABLET BY MOUTH EVERY DAY AS DIRECTED Patient taking differently: Take 0.5 tablets by mouth daily. 07/07/20   Almyra Deforest, PA    Physical Exam    Vital Signs:  [name] does not have vital signs available for review today.  Given telephonic nature of communication, physical exam is limited. AAOx3. NAD. Normal affect.  Speech and respirations are unlabored.  Accessory Clinical Findings    None  Assessment & Plan    1.  Preoperative Cardiovascular Risk Assessment:  According to the Revised Cardiac Risk Index (RCRI), her Perioperative Risk of Major Cardiac Event is  (%): 0.9. Her Functional Capacity in METs is: 5.81 according to the [facility] Activity Status Index (DASI).Therefore, based on ACC/AHA guidelines, patient would be at acceptable risk for the planned procedure without further cardiovascular testing. I advised the patient that if she has any new symptoms prior to her procedure, to please notify our office.  Patient with diagnosis of PAF on Elquis for anticoagulation. Patient has had atrial appendage clipped.  CHA2DS2-VASc Score = 5  This indicates a 7.2% annual risk of stroke. The patient's score is based upon: CHF History: 1 HTN History: 1 Diabetes History: 1 Stroke History: 0 Vascular Disease History: 0 Age Score: 1 Gender Score: 1     CrCl 49 ml/min using adjusted body weight Platelet count 264K   Per office protocol, patient can hold Eliquis for 2 days prior to procedure.  A copy of this note will be routed to requesting surgeon.  Time:   Today, I have spent 7 minutes with the patient with telehealth technology discussing medical history, symptoms, and management plan.     Lenna Sciara, NP  07/05/2021, 11:55 AM

## 2021-08-13 ENCOUNTER — Ambulatory Visit (INDEPENDENT_AMBULATORY_CARE_PROVIDER_SITE_OTHER): Payer: Medicare PPO

## 2021-08-13 DIAGNOSIS — I480 Paroxysmal atrial fibrillation: Secondary | ICD-10-CM | POA: Diagnosis not present

## 2021-08-17 LAB — CUP PACEART REMOTE DEVICE CHECK
Battery Impedance: 1960 Ohm
Battery Remaining Longevity: 26 mo
Battery Voltage: 2.75 V
Brady Statistic AP VP Percent: 25 %
Brady Statistic AP VS Percent: 0 %
Brady Statistic AS VP Percent: 75 %
Brady Statistic AS VS Percent: 0 %
Date Time Interrogation Session: 20230721081527
Implantable Lead Implant Date: 20060109
Implantable Lead Implant Date: 20060116
Implantable Lead Location: 753858
Implantable Lead Location: 753859
Implantable Lead Model: 5071
Implantable Lead Model: 5076
Implantable Pulse Generator Implant Date: 20140609
Lead Channel Impedance Value: 400 Ohm
Lead Channel Impedance Value: 443 Ohm
Lead Channel Pacing Threshold Amplitude: 1.25 V
Lead Channel Pacing Threshold Amplitude: 1.375 V
Lead Channel Pacing Threshold Pulse Width: 0.4 ms
Lead Channel Pacing Threshold Pulse Width: 0.4 ms
Lead Channel Setting Pacing Amplitude: 2.5 V
Lead Channel Setting Pacing Amplitude: 2.75 V
Lead Channel Setting Pacing Pulse Width: 0.4 ms
Lead Channel Setting Sensing Sensitivity: 4 mV

## 2021-09-17 NOTE — Progress Notes (Signed)
[facility] Summary Report / Loop Recorder 

## 2021-09-19 ENCOUNTER — Other Ambulatory Visit: Payer: Self-pay | Admitting: Physician Assistant

## 2021-11-12 ENCOUNTER — Ambulatory Visit (INDEPENDENT_AMBULATORY_CARE_PROVIDER_SITE_OTHER): Payer: Medicare PPO

## 2021-11-12 DIAGNOSIS — I442 Atrioventricular block, complete: Secondary | ICD-10-CM | POA: Diagnosis not present

## 2021-11-15 LAB — CUP PACEART REMOTE DEVICE CHECK
Battery Impedance: 2001 Ohm
Battery Remaining Longevity: 31 mo
Battery Voltage: 2.75 V
Brady Statistic AP VP Percent: 23 %
Brady Statistic AP VS Percent: 0 %
Brady Statistic AS VP Percent: 76 %
Brady Statistic AS VS Percent: 0 %
Date Time Interrogation Session: 20231018151027
Implantable Lead Implant Date: 20060109
Implantable Lead Implant Date: 20060116
Implantable Lead Location: 753858
Implantable Lead Location: 753859
Implantable Lead Model: 5071
Implantable Lead Model: 5076
Implantable Pulse Generator Implant Date: 20140609
Lead Channel Impedance Value: 418 Ohm
Lead Channel Impedance Value: 471 Ohm
Lead Channel Pacing Threshold Amplitude: 1.125 V
Lead Channel Pacing Threshold Amplitude: 1.25 V
Lead Channel Pacing Threshold Pulse Width: 0.4 ms
Lead Channel Pacing Threshold Pulse Width: 0.4 ms
Lead Channel Setting Pacing Amplitude: 2.25 V
Lead Channel Setting Pacing Amplitude: 2.5 V
Lead Channel Setting Pacing Pulse Width: 0.4 ms
Lead Channel Setting Sensing Sensitivity: 4 mV

## 2021-11-29 NOTE — Progress Notes (Signed)
Remote pacemaker transmission.   

## 2021-12-17 ENCOUNTER — Other Ambulatory Visit: Payer: Self-pay | Admitting: Cardiovascular Disease

## 2022-01-17 ENCOUNTER — Ambulatory Visit: Payer: Medicare PPO | Attending: Cardiovascular Disease | Admitting: Cardiovascular Disease

## 2022-01-17 ENCOUNTER — Encounter: Payer: Self-pay | Admitting: Cardiovascular Disease

## 2022-01-17 VITALS — BP 103/64 | HR 75 | Ht 63.0 in | Wt 202.6 lb

## 2022-01-17 DIAGNOSIS — Z9889 Other specified postprocedural states: Secondary | ICD-10-CM

## 2022-01-17 DIAGNOSIS — I442 Atrioventricular block, complete: Secondary | ICD-10-CM | POA: Diagnosis not present

## 2022-01-17 DIAGNOSIS — I5042 Chronic combined systolic (congestive) and diastolic (congestive) heart failure: Secondary | ICD-10-CM

## 2022-01-17 DIAGNOSIS — D6869 Other thrombophilia: Secondary | ICD-10-CM

## 2022-01-17 DIAGNOSIS — I10 Essential (primary) hypertension: Secondary | ICD-10-CM

## 2022-01-17 DIAGNOSIS — Z950 Presence of cardiac pacemaker: Secondary | ICD-10-CM

## 2022-01-17 DIAGNOSIS — Z5181 Encounter for therapeutic drug level monitoring: Secondary | ICD-10-CM

## 2022-01-17 DIAGNOSIS — Z79899 Other long term (current) drug therapy: Secondary | ICD-10-CM

## 2022-01-17 DIAGNOSIS — I480 Paroxysmal atrial fibrillation: Secondary | ICD-10-CM | POA: Diagnosis not present

## 2022-01-17 MED ORDER — APIXABAN 5 MG PO TABS: 5.0000 mg | ORAL_TABLET | Freq: Two times a day (BID) | ORAL | 2 refills | Status: DC

## 2022-01-17 MED ORDER — ROSUVASTATIN CALCIUM 10 MG PO TABS: 10.0000 mg | ORAL_TABLET | ORAL | 3 refills | Status: DC

## 2022-01-17 MED ORDER — TRIAMTERENE-HCTZ 37.5-25 MG PO TABS: 0.5000 | ORAL_TABLET | Freq: Every day | ORAL | 3 refills | Status: DC

## 2022-01-17 NOTE — Progress Notes (Signed)
Patient ID: [name], female   DOB: 05/08/1945, 76 y.o.   MRN: [id number]    Cardiology Office Note    Date:  01/23/2022   ID:  [name], DOB 10/03/1945, MRN [id number]  PCP:  [facility]  Cardiologist:   Michelle Klein, MD   Chief Complaint  Patient presents with   Cardiac Valve Problem    History of Present Illness:  [name] is a 76 y.o. female with a history of previous mitral valve annuloplasty repair, sinus node dysfunction, complete heart block, dual-chamber permanent pacemaker (LV epicardial lead), paroxysmal atrial fibrillation on chronic warfarin anticoagulation and sotalol.  She has not had any cardiovascular issues since her last appointment, but did have diverticulitis in the spring.  She denies chest pain or shortness of breath at rest or with activity, palpitations, dizziness, syncope, edema, orthopnea, PND, focal neurological events, claudication, falls, serious bleeding problems.  Blood pressure control has been good.  Presenting rhythm today is atrial ventricular sequential pacing.  The paced QRS is quite brought at 190 ms but she has a distinct positive R wave in lead V1 (epicardial lead)..  She is on sotalol.  Her QTc today is 569 ms, usually around 540 ms.  Pacemaker interrogation shows that the burden of atrial fibrillation remains low, less than 30% and there is no evidence of rapid ventricular rates.  Most episodes are quite brief, none of them over 2 hours.  She only has 23% atrial pacing but has 100% ventricular pacing.  There are very rare and very brief episodes of nonsustained VT.  Generator longevity is estimated at about 2.5 years and lead parameters are good.   She has a dual-chamber Medtronic Adapta device was implanted in 2014 and her leads were implanted in 2006.  She has a Medtronic 5076 transvenous atrial lead and a Medtronic 5071 epicardial left ventricular lead placed at the time of surgery.  Her burden of  atrial fibrillation remains stable at about 6%.  She had a longer episode in September lasting for about 2-1/2 days, most of the events last for just a few hours.  They are all asymptomatic.  Occasionally she will have lengthy episodes of sustained paroxysmal atrial tachycardia, possibly atypical atrial flutter.  Her most recent echocardiogram from 02/01/2021 shows unchanged mildly depressed left ventricular systolic function with LVEF 45-50%.  There was a slight increase in the mean transmitral gradients from 2 mmHg in 2017 to 5 mmHg in 2023 (heart rate 60 bpm).   Echo September 2017 shows mildly depressed left ventricular systolic function with EF of 45-50%, at least in part secondary to pacing induced systolic dyssynchrony. There was echo evidence of elevated filling pressures due to grade 2 diastolic dysfunction. The left atrium is mildly dilated with end systolic diameter of 41 mm. there was no evidence of mitral insufficiency and her annuloplasty was not associated with significantly increased mitral gradients.  [name] had mitral valve annuloplasty for severe mitral insufficiency in 2006.  The left atrial appendage was ligated during that procedure.  She did not have coronary disease by preoperative angiography and has normal left ventricular systolic function. Followup echocardiography 2015 and 2017 shows borderline low left ventricular ejection fraction (45-50%). Valve repair looks good. She had a dual chamber permanent pacemaker (Medtronic) implanted in 2006 and has complete heart block . The right atrial lead is a conventional transvenous lead. The ventricular lead is an active fixation epicardial lead on the surface of the left  ventricle placed at the time of her surgery. A generator change was performed in 2014. Her pacemaker is located in a deep subpectoral pocket. She has paroxysmal atrial fibrillation. She has treated hypertension, dyslipidemia and type 2 diabetes mellitus on the  background of severe obesity.   Past Medical History:  Diagnosis Date   Arthritis    Atrial fibrillation [facility])    PAF---currently anticoagulated with Warfarin   Atrial flutter ([place]) 02/21/2004   s/p mitral valve repair and ppm insertion   Congenital heart block 08/26/2003   Diagnosed by [name]. A temp orary pacemaker was placed in 1988 during hysterectomy procedure.   Diabetes mellitus without complication ([place])    prediabetic   Diverticulosis    Dyslipidemia    takes Niacin and Crestor daily   History of blood transfusion    no abnormal reaction noted   Hypothyroidism    takes Synthroid daily   Mitral valve insufficiency    Recieved Mitral valve repair with reconstruction of the anterior leaflet cordis with cortex and ring annuloplasty   Nonischemic cardiomyopathy ([place]) 2006   EF of 35% due to AV dyssyncrony. Has since resolved per most recent ECHO.   RBBB    Due the VP with the LV epicardial lead that was inserted in 2006 during the mitral repair   Systemic hypertension    takes  Ramipril daily-Maxzide every other day   Urinary urgency     Past Surgical History:  Procedure Laterality Date   ABDOMINAL HYSTERECTOMY     BREAST LUMPECTOMY WITH RADIOACTIVE SEED LOCALIZATION Right 11/22/2014   Procedure: RIGHT BREAST LUMPECTOMY WITH RADIOACTIVE SEED LOCALIZATION;  Surgeon: Michelle Skates, MD;  Location: [facility];  Service: General;  Laterality: Right;   CARDIAC CATHETERIZATION  10/20/2003   Minimal decrease in the LV systolic EF of 16-10% with global hyperkinesis. Moderately dilated LV. 3+ Mitral regurgitation. Underfilling of LAD which promptly improved with intracoronary NTG administration. Microvascular angina or microvascular spasm. Otherwise coronaries were normal. No intervention necessary.   CARDIOVASCULAR STRESS TEST  09/14/2003   Performed for the pt's congenital CHB that was discovered during a preoperative evaluation for a colonoscopy.  EKG demonstrated CHB with  junctional escape. Nonspecific ST changes noted. Stress ECG was positive for ischemia(2 mm ST depression). Dilated LV cavity. Inability to calculate systolic function. High risk scan. This scan was followed up with a cardiac cath.   COLONOSCOPY     MITRAL VALVE ANNULOPLASTY  02/06/2004   Performed for severe MR requiring mitral valve repair with anterior leaflet reconstruction and a Shedd annuloplasty ring--model: [id number], and an 80% lesion reduction (Dr. Servando Snare). Two LV leads were placed epicardially just in the event that biv pacing would be needed. (One of the leads had been used for during the ppm insertion recieved one week later)    PERMANENT PACEMAKER GENERATOR CHANGE  07/06/2012   Medtronic   PERMANENT PACEMAKER GENERATOR CHANGE N/A 07/06/2012   Procedure: PERMANENT PACEMAKER GENERATOR CHANGE;  Surgeon: Michelle Klein, MD;  Location: [facility];  Service: Cardiovascular;  Laterality: N/A;   PERMANENT PACEMAKER INSERTION  02/13/2004   PPM implant following a mitral valve annuloplasty. An epicardial active fixation lead (implanted on 02/06/2004 during the mitral valve procedure) was used for this implant.    US ECHOCARDIOGRAPHY  09/10/2010 and1/07/2008   The last echo showed an EF of >55%; RV mildly dilated. Mild to moderately dilated LA  with borderline RA dilation as well. Impaired diastolic function. Normal RV  systolic function.MVR without residual insufficiency.No Pericardial effusion.    Current Medications: Outpatient Medications Prior to Visit  Medication Sig Dispense Refill   acetaminophen (TYLENOL) 325 MG tablet Take 650 mg by mouth daily as needed for pain.     cholecalciferol (VITAMIN D) 400 UNITS TABS Take 400 Units by mouth daily.     folic acid (FOLVITE) 1 MG tablet TAKE 1 TABLET(1 MG) BY MOUTH DAILY 90 tablet 3   levothyroxine (SYNTHROID) 75 MCG tablet Take 1 tablet (75 mcg total) by mouth daily before breakfast. 90 tablet 3   niacin 500 MG tablet Take 500  mg by mouth every evening.      Omega-3 Fatty Acids (OMEGA 3 PO) Take 1,000 mg by mouth daily.     ramipril (ALTACE) 10 MG capsule TAKE 1 CAPSULE(10 MG) BY MOUTH AT BEDTIME 90 capsule 3   sotalol (BETAPACE) 80 MG tablet TAKE 1 TABLET(80 MG) BY MOUTH TWICE DAILY 180 tablet 3   apixaban (ELIQUIS) 5 MG TABS tablet Take 1 tablet (5 mg total) by mouth 2 (two) times daily. 180 tablet 1   rosuvastatin (CRESTOR) 10 MG tablet Take 1 tablet (10 mg total) by mouth daily. (Patient taking differently: Take 10 mg by mouth every other day.) 90 tablet 3   triamterene-hydrochlorothiazide (MAXZIDE-25) 37.5-25 MG tablet TAKE 1/2 TABLET BY MOUTH EVERY DAY AS DIRECTED (Patient taking differently: Take 0.5 tablets by mouth daily.) 45 tablet 3   feeding supplement, GLUCERNA SHAKE, (GLUCERNA SHAKE) LIQD Take 237 mLs by mouth 3 (three) times daily between meals. (Patient not taking: Reported on 01/17/2022) 10000 mL 0   No facility-administered medications prior to visit.     Allergies:   Patient has no known allergies.   Social History   Socioeconomic History   Marital status: Single    Spouse name: Not on file   Number of children: Not on file   Years of education: Not on file   Highest education level: Not on file  Occupational History   Not on file  Tobacco Use   Smoking status: Former    Types: Cigarettes   Smokeless tobacco: Never   Tobacco comments:    quit smoking in 2006  Substance and Sexual Activity   Alcohol use: Yes    Comment: wine occasionally   Drug use: No   Sexual activity: Yes    Birth control/protection: Surgical  Other Topics Concern   Not on file  Social History Narrative   Not on file   Social Determinants of Health   Financial Resource Strain: Not on file  Food Insecurity: Not on file  Transportation Needs: Not on file  Physical Activity: Not on file  Stress: Not on file  Social Connections: Not on file     Family History:  The patient's family history includes Asthma  in her brother and sister; Cancer - Other in her mother; Healthy in her brother, brother, brother, and brother; Pneumonia in her father.   ROS:   Please see the history of present illness.    ROS All other systems are reviewed and are negative.   PHYSICAL EXAM:   VS:  BP 103/64 (BP Location: Left Arm, Patient Position: Sitting, Cuff Size: Large)   Pulse 75   Ht '5\' 3"'$  (1.6 m)   Wt 202 lb 9.6 oz (91.9 kg)   SpO2 98%   BMI 35.89 kg/m       General: Alert, oriented x3, no distress, healthy left subclavian pacemaker site Head: no evidence  of trauma, PERRL, EOMI, no exophtalmos or lid lag, no myxedema, no xanthelasma; normal ears, nose and oropharynx Neck: normal jugular venous pulsations and no hepatojugular reflux; brisk carotid pulses without delay and no carotid bruits Chest: clear to auscultation, no signs of consolidation by percussion or palpation, normal fremitus, symmetrical and full respiratory excursions Cardiovascular: normal position and quality of the apical impulse, regular rhythm, normal first and second heart sounds, no murmurs, rubs or gallops Abdomen: no tenderness or distention, no masses by palpation, no abnormal pulsatility or arterial bruits, normal bowel sounds, no hepatosplenomegaly Extremities: no clubbing, cyanosis or edema; 2+ radial, ulnar and brachial pulses bilaterally; 2+ right femoral, posterior tibial and dorsalis pedis pulses; 2+ left femoral, posterior tibial and dorsalis pedis pulses; no subclavian or femoral bruits Neurological: grossly nonfocal Psych: Normal mood and affect    Wt Readings from Last 3 Encounters:  01/17/22 202 lb 9.6 oz (91.9 kg)  04/05/21 210 lb (95.3 kg)  01/02/21 208 lb 8 oz (94.6 kg)      Studies/Labs Reviewed:   EKG:  EKG is  ordered today.  Personally reviewed, it is very similar to previous tracings.  She has atrial sensed (sinus mechanism), ventricular paced rhythm with a very broad and tall R wave in lead V1.  QTc 548  ms.    Echocardiogram 02/01/2021:    1. Left ventricular ejection fraction, by estimation, is 45 to 50%. Left  ventricular ejection fraction by 3D volume is 48 %. The left ventricle has  mildly decreased function. The left ventricle demonstrates regional wall  motion abnormalities (see  scoring diagram/findings for description). There is mild left ventricular  hypertrophy. Left ventricular diastolic parameters are consistent with  Grade I diastolic dysfunction (impaired relaxation).   2. Low normal RV free wall strain at -24.7%. Right ventricular systolic  function is mildly reduced. The right ventricular size is normal.  Tricuspid regurgitation signal is inadequate for assessing PA pressure.   3. The mitral valve has been repaired/replaced. Trivial mitral valve  regurgitation. Mild mitral stenosis. The mean mitral valve gradient is 5.0  mmHg with average heart rate of 68 bpm. There is a prosthetic annuloplasty  ring present in the mitral  position.   4. The aortic valve is tricuspid. Aortic valve regurgitation is trivial.  Aortic valve sclerosis is present, with no evidence of aortic valve  stenosis. Aortic regurgitation PHT measures 442 msec.   5. Aortic dilatation noted. There is borderline dilatation of the  ascending aorta, measuring 40 mm.   6. The inferior vena cava is normal in size with greater than 50%  respiratory variability, suggesting right atrial pressure of 3 mmHg.   Comparison(s): 10/05/15 EF 45-50% MV 78mHg mean PG.  Recent Labs: 04/05/2021: ALT 15; Magnesium 2.2 04/07/2021: BUN 13; Creatinine, Ser 1.09; Hemoglobin 13.0; Platelets 264; Potassium 3.8; Sodium 139   07/23/2021 Hemoglobin A1c 6.2% TSH 3.47 Creatinine 1.06, potassium 3.9, normal liver function tests Cholesterol 158, triglycerides 113, HDL 49, LDL 89  ASSESSMENT:    1. PAF (paroxysmal atrial fibrillation) ([facility]   2. Chronic combined systolic and diastolic heart failure ([facility]   3. CHB (complete heart  block) (H[facility])   4. Pacemaker   5. Status post mitral valve annuloplasty   6. Acquired thrombophilia ([place]   7. Encounter for monitoring sotalol therapy   8. Essential hypertension   9. Severe obesity (BMI 35.0-39.9) with comorbidity ([place]      PLAN:  In order of problems listed above:  Afib: This year  the burden is actually lower than in the past over the last 3 months less than 3%.  In addition to paroxysmal atrial fibrillation she sometimes has paroxysmal atrial tachycardia (probably left atrial flutter related to the scar over atriotomy).   She is therapeutically anticoagulated.  Valvular atrial fibrillation, CHA2DS2-VASc score is not relevant.  She has had her appendage clipped so interruption of anticoagulation should be relatively safe. CHF: NYHA functional class I and euvolemic without diuretics.  Stable left ventricular EF 45-50%, some variation in evaluation, but direct image comparison shows little change. Preop EF was 40-45%, so current EF is expected. On ACE inhibitor and "beta blocker" (sotalol).  CHB: Pacemaker dependent.  Her V pacing lead is an epicardial LV lead. Upgrade to CRT is therefore less likely to be of benefit, except that the QRS complex is remarkably broad. PM: Normal device function.  Continue remote downloads every 3 months and 35-monthoffice visits for QT monitoring. MR s/p annuloplasty: Slight increase in mitral gradients, but no symptoms of mitral stenosis. Warfarin: No serious bleeding problems.  She did have surgical closure of the left atrial appendage at the time of mitral surgery and only mildly data left atrium. CHADSVasc at least 5 (age 76 gender, LV dysfunction/CHF, hypertension), but is not truly relevant in the setting of valvular heart disease. Sotalol: QT is very broad but this is largely due to the very broad QRS at 190 ms.  Adjusting for this, her QT interval is acceptable.  Has not had any evidence of proarrhythmia.  Reminded her about the potential  for multiple drug-drug interactions with sotalol and warfarin. HTN: Blood pressure is actually low normal range.  She is on maximum dose ACE inhibitor.  Her sotalol does serve as a "beta-blocker".  In the past we have had to reduce her triamterene-hydrochlorothiazide to half of the current dose due to issues with low blood pressure.  If we need to reduce medications further, we will get rid of the triamterene and continue only hydrochlorothiazide Obesity: Weight loss recommended.   Medication Adjustments/Labs and Tests Ordered: Current medicines are reviewed at length with the patient today.  Concerns regarding medicines are outlined above.  Medication changes, Labs and Tests ordered today are listed in the Patient Instructions below. Patient Instructions  Medication Instructions:  No changes   *If you need a refill on your cardiac medications before your next appointment, please call your pharmacy*   Lab Work: Not needed    Testing/Procedures: Not needed   Follow-Up: [facility] and [facility] needs [facility] our [facility].  As part of our continuing mission to provide you with exceptional heart care, we have created designated Provider Care Teams.  These Care Teams include your primary Cardiologist (physician) and Advanced Practice Providers (APPs -  Physician Assistants and Nurse Practitioners) who all work together to provide you with the care you need, when you need it.     Your next appointment:   6 to 7  month(s)  The format for your next appointment:   In Person  Provider:   MSanda Klein MD       Signed, MSanda Klein MD  01/23/2022 12:25 PM    [facility] [address] [place]   [id number]: [phone] Fax: [phone]

## 2022-01-17 NOTE — Patient Instructions (Addendum)
Medication Instructions:  No changes   *If you need a refill on your cardiac medications before your next appointment, please call your pharmacy*   Lab Work: Not needed    Testing/Procedures: Not needed   Follow-Up: At [facility], you and your health needs are our priority.  As part of our continuing mission to provide you with exceptional heart care, we have created designated Provider Care Teams.  These Care Teams include your primary Cardiologist (physician) and Advanced Practice Providers (APPs -  Physician Assistants and Nurse Practitioners) who all work together to provide you with the care you need, when you need it.     Your next appointment:   6 to 7  month(s)  The format for your next appointment:   In Person  Provider:   Sanda Klein, MD

## 2022-01-23 ENCOUNTER — Encounter: Payer: Self-pay | Admitting: Cardiovascular Disease

## 2022-02-11 ENCOUNTER — Ambulatory Visit (INDEPENDENT_AMBULATORY_CARE_PROVIDER_SITE_OTHER): Payer: Medicare PPO

## 2022-02-11 DIAGNOSIS — I442 Atrioventricular block, complete: Secondary | ICD-10-CM

## 2022-02-12 LAB — CUP PACEART REMOTE DEVICE CHECK
Battery Impedance: 2071 Ohm
Battery Remaining Longevity: 30 mo
Battery Voltage: 2.75 V
Brady Statistic AP VP Percent: 23 %
Brady Statistic AP VS Percent: 0 %
Brady Statistic AS VP Percent: 77 %
Brady Statistic AS VS Percent: 0 %
Date Time Interrogation Session: 20240116114107
Implantable Lead Connection Status: 753985
Implantable Lead Connection Status: 753985
Implantable Lead Implant Date: 20060109
Implantable Lead Implant Date: 20060116
Implantable Lead Location: 753858
Implantable Lead Location: 753859
Implantable Lead Model: 5071
Implantable Lead Model: 5076
Implantable Pulse Generator Implant Date: 20140609
Lead Channel Impedance Value: 429 Ohm
Lead Channel Impedance Value: 465 Ohm
Lead Channel Pacing Threshold Amplitude: 1.125 V
Lead Channel Pacing Threshold Amplitude: 1.25 V
Lead Channel Pacing Threshold Pulse Width: 0.4 ms
Lead Channel Pacing Threshold Pulse Width: 0.4 ms
Lead Channel Setting Pacing Amplitude: 2.25 V
Lead Channel Setting Pacing Amplitude: 2.5 V
Lead Channel Setting Pacing Pulse Width: 0.4 ms
Lead Channel Setting Sensing Sensitivity: 4 mV
Zone Setting Status: 755011
Zone Setting Status: 755011

## 2022-03-14 ENCOUNTER — Other Ambulatory Visit: Payer: Self-pay

## 2022-03-14 MED ORDER — APIXABAN 5 MG PO TABS: 5.0000 mg | ORAL_TABLET | Freq: Two times a day (BID) | ORAL | 1 refills | Status: DC

## 2022-03-14 NOTE — Telephone Encounter (Signed)
Prescription refill request for Eliquis received. Indication: Afib  Last office visit: 01/17/22 ([place])  Scr: 1.09 (04/07/21)  Age: 77 Weight: 91.9kg  Appropriate dose. Refill sent.

## 2022-03-21 NOTE — Progress Notes (Signed)
Remote pacemaker transmission.   

## 2022-04-28 IMAGING — CT CT ABD-PELV W/ CM
2 of 5 series · 15 of 46 positions shown, 17 images · IV contrast (OMNIPAQUE 300)
Comparison: None.

CLINICAL DATA: Left lower quadrant pain.

EXAM:
CT ABDOMEN AND PELVIS WITH CONTRAST
TECHNIQUE: Multidetector CT imaging of the abdomen and pelvis was performed
using the standard protocol following bolus administration of
intravenous contrast.

[Series 2: axial st · axial · 0.71mm/px · z∈[+894,+1229]mm · 12 of 77 slices shown, 14 images]
[im 5/77  soft-tissue]
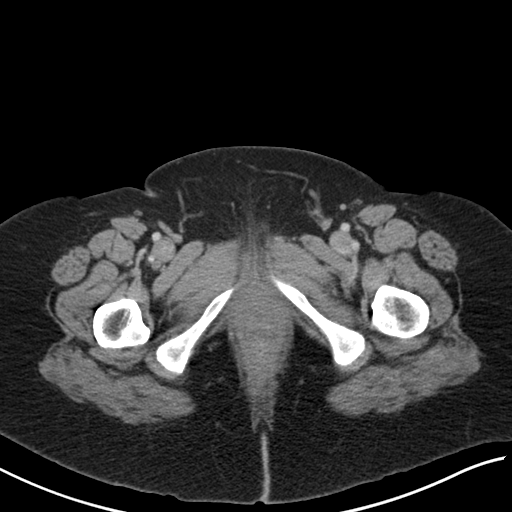
[im 5/77  bone]
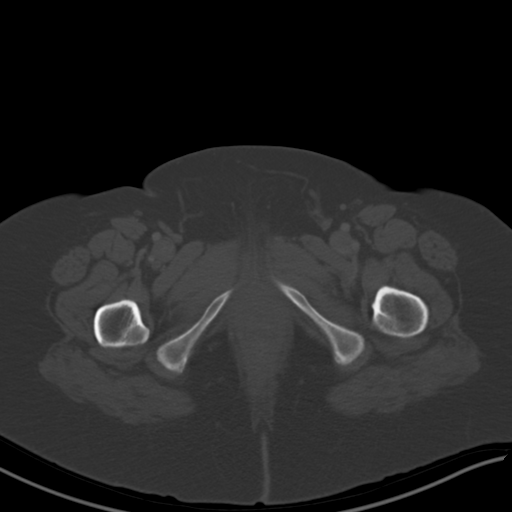
[im 10/77  soft-tissue]
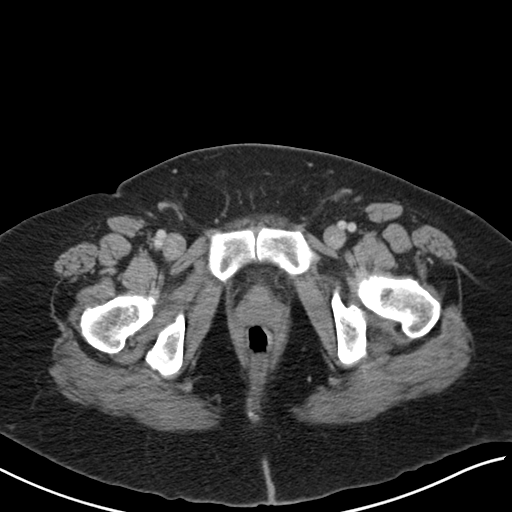
[im 20/77  soft-tissue]
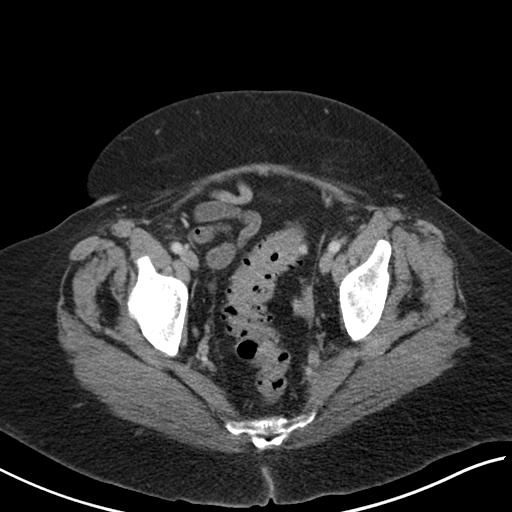
[im 24/77  soft-tissue]
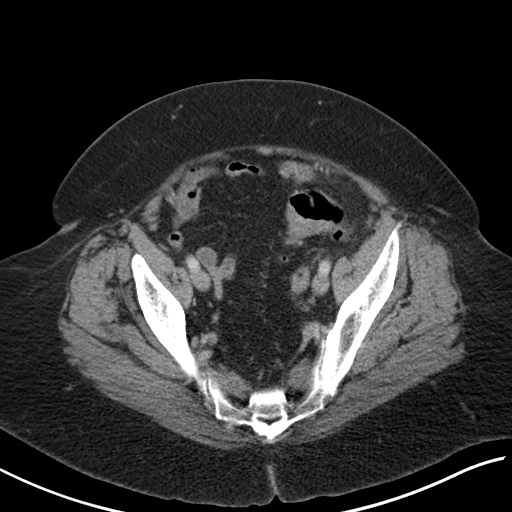
[im 29/77  soft-tissue]
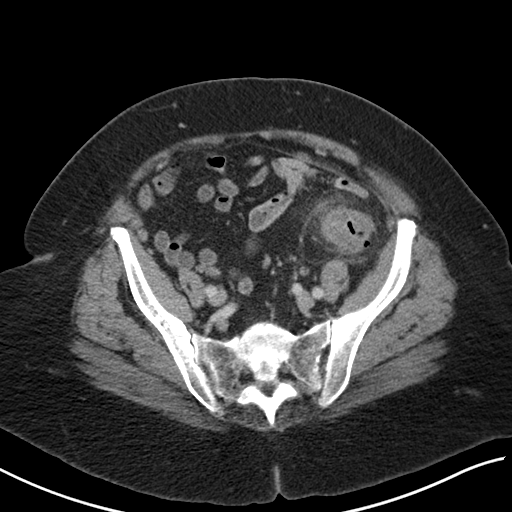
[im 34/77  soft-tissue]
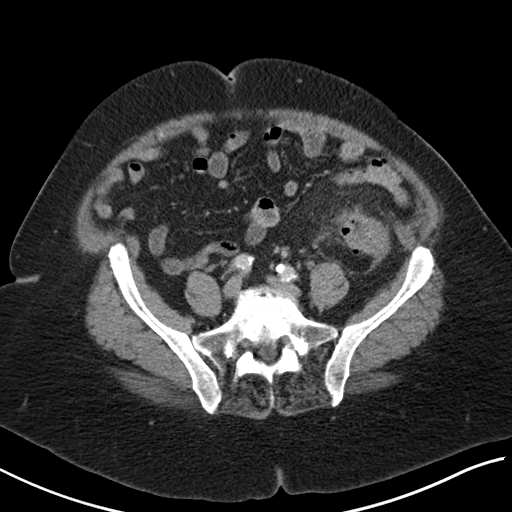
[im 43/77  soft-tissue]
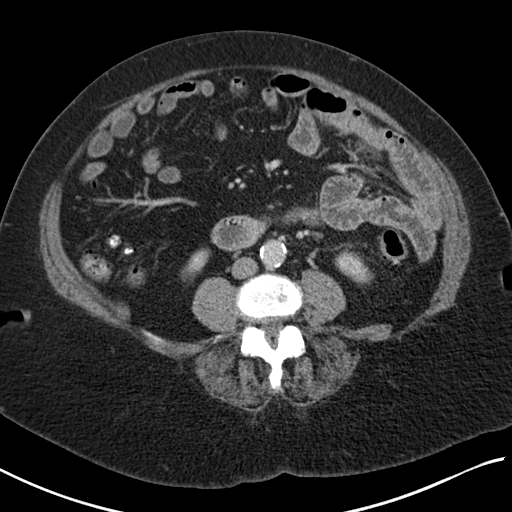
[im 48/77  soft-tissue]
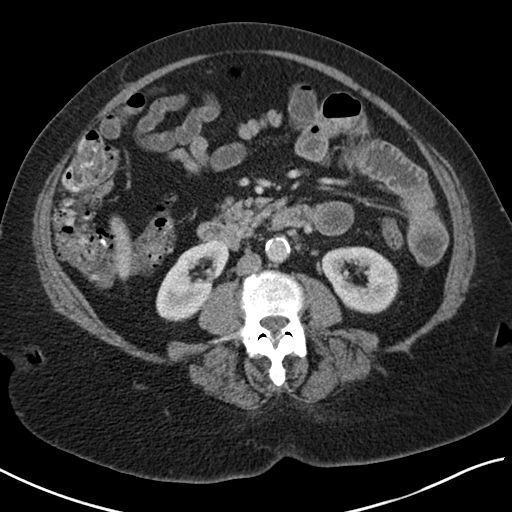
[im 53/77  soft-tissue]
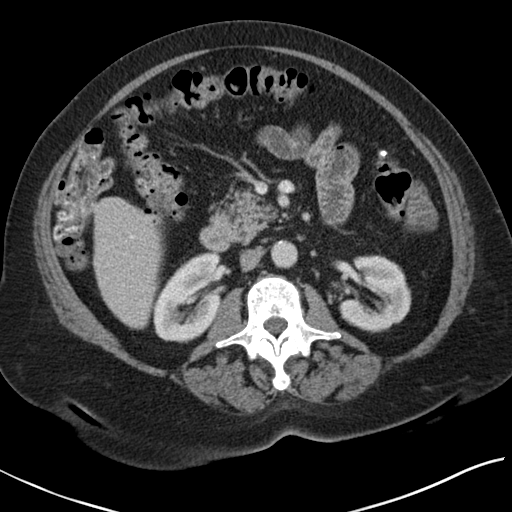
[im 53/77  bone]
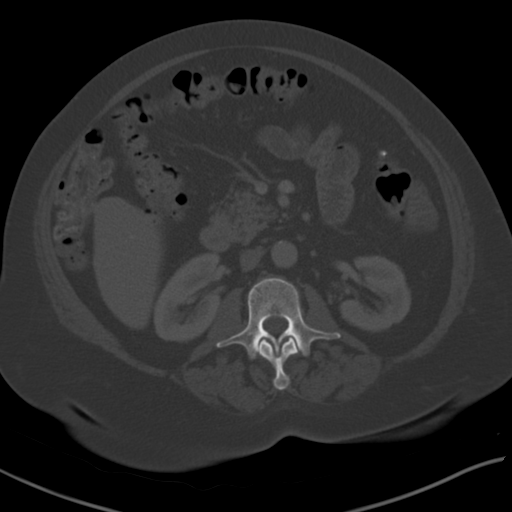
[im 58/77  soft-tissue]
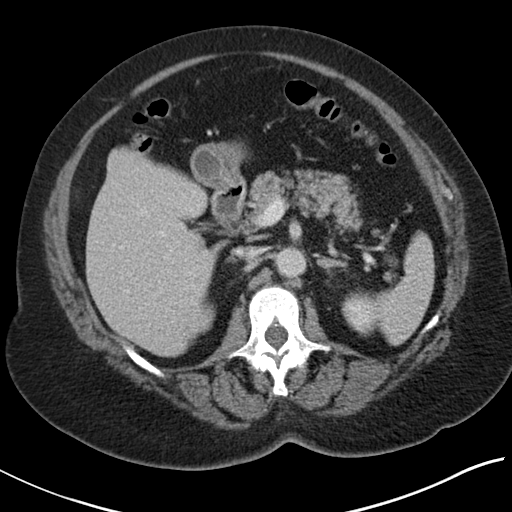
[im 67/77  soft-tissue]
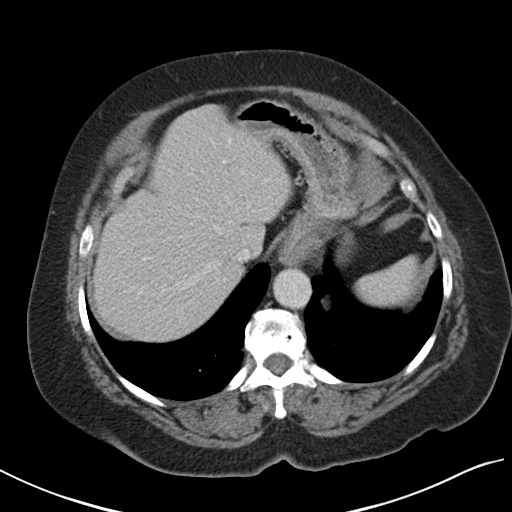
[im 72/77  soft-tissue]
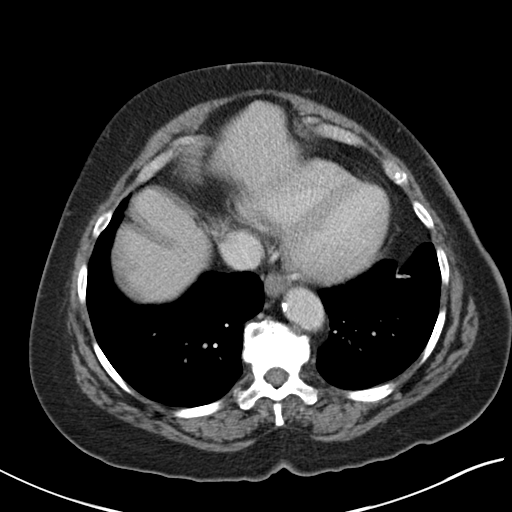

[Series 4: coronal st · coronal · 0.70mm/px · 3 of 164 slices shown]
[im 55/164  soft-tissue]
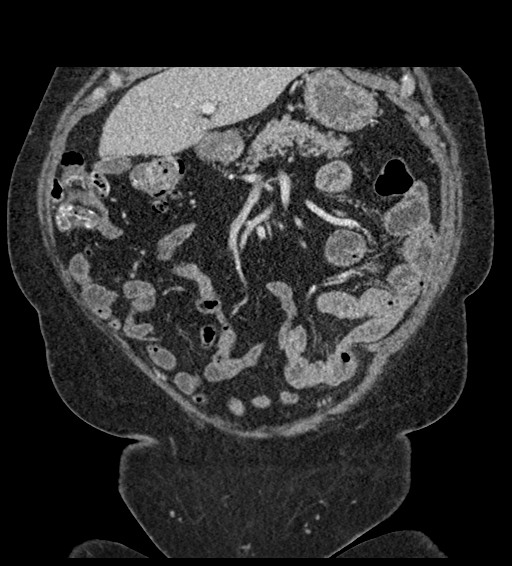
[im 73/164  soft-tissue]
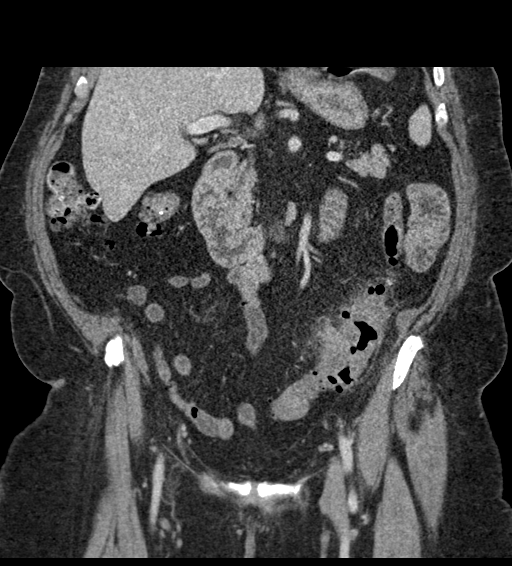
[im 91/164  soft-tissue]
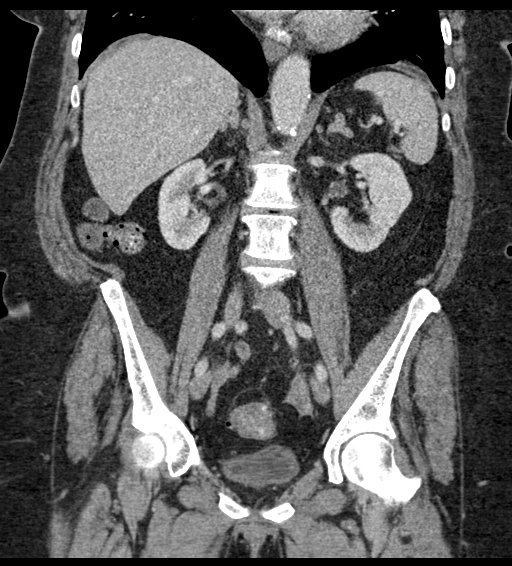

[15 of 46 positions shown; findings below may reference images not displayed]

RADIATION DOSE REDUCTION: This exam was performed according to the
departmental dose-optimization program which includes automated
exposure control, adjustment of the mA and/or kV according to
patient size and/or use of iterative reconstruction technique.

CONTRAST:  100mL OMNIPAQUE IOHEXOL 300 MG/ML  SOLN
FINDINGS: Lower chest: There is a small Bochdalek's fat herniation through the
diaphragm into the posteromedial lower left chest. There is
scattered linear scarring or atelectasis of the lung bases without
infiltrates.

Small hiatal hernia. There is mild cardiomegaly. There is metallic
artifact along the mid/lower left heart border related to external
pacing wiring.

Hepatobiliary: Unremarkable liver, gallbladder and bile ducts. No
portal vein dilatation.

Pancreas: There is no mass enhancement, ductal dilatation or
adjacent edema.

Spleen: No mass or splenomegaly.

Adrenals/Urinary Tract: There is no adrenal mass, no focal renal
cortical abnormality and no evidence for urinary stones or
obstruction. The bladder is mildly thickened but not fully
distended.

Stomach/Bowel: Small hiatal hernia. Unremarkable gastric wall and
unopacified small bowel. Normal appendix. Diffuse diverticulosis is
seen. 8 cm segment of thickened and inflamed large bowel is seen at
the junction of the descending and sigmoid colon with moderate
surrounding inflammatory reaction and trace nonlocalizing reactive
fluid in the distal left paracolic gutter.

There is no adjacent abscess or free air. Advanced diverticulosis
continues along the sigmoid segment without additional inflammatory
reaction.

Vascular/Lymphatic: Aortic atherosclerosis. No enlarged abdominal or
pelvic lymph nodes.

Reproductive: Status post hysterectomy. No adnexal masses.

Other: There are small umbilical and inguinal fat hernias. There is
no free hemorrhage, abscess or free air. Only free fluid is the
trace amount in the distal left paracolic gutter.

Musculoskeletal: There is osteopenia and advanced degenerative disc
changes in the lumbar spine, lower thoracic spine with moderate
lumbar facet hypertrophy. No worrisome regional skeletal lesion.
Acquired spinal canal and severe left foraminal stenosis noted,
L5-S1.
IMPRESSION: 1. Acute diverticulitis, junction of the distal descending and
proximal sigmoid colon. There is adjacent trace nonlocalizing
reactive fluid but no free air or abscess. Follow-up colonoscopy
recommended after treatment to exclude underlying lesion.
2. Other diffuse colonic diverticula are uncomplicated.
3. Small hiatal hernia.  Small umbilical and inguinal fat hernias.
4. Mild cardiomegaly.
5. Aortic atherosclerosis.
6. Osteopenia and degenerative change.

## 2022-05-10 ENCOUNTER — Ambulatory Visit (INDEPENDENT_AMBULATORY_CARE_PROVIDER_SITE_OTHER): Payer: Medicare PPO

## 2022-05-10 ENCOUNTER — Ambulatory Visit
Admission: EM | Admit: 2022-05-10 | Discharge: 2022-05-10 | Disposition: A | Discharge status: Home / Self Care | Payer: Medicare PPO | Attending: Family Medicine | Admitting: Family Medicine

## 2022-05-10 DIAGNOSIS — M79671 Pain in right foot: Secondary | ICD-10-CM

## 2022-05-10 DIAGNOSIS — M109 Gout, unspecified: Secondary | ICD-10-CM | POA: Diagnosis not present

## 2022-05-10 MED ORDER — COLCHICINE 0.6 MG PO TABS: 0.6000 mg | ORAL_TABLET | Freq: Every day | ORAL | 0 refills | Status: DC

## 2022-05-10 MED ORDER — PREDNISONE 20 MG PO TABS: 20.0000 mg | ORAL_TABLET | Freq: Every day | ORAL | 0 refills | Status: AC

## 2022-05-10 NOTE — ED Triage Notes (Signed)
Pt presents with right foot pain & swelling the past few days with no known injury.

## 2022-05-10 NOTE — ED Provider Notes (Signed)
[facility]    CSN: [id number] Arrival date & time: 05/10/22  1355      History   Chief Complaint Chief Complaint  Patient presents with   Foot Pain    HPI [name] is a 77 y.o. female.   Patient is here for right foot pain and swelling.  Started several days ago.  No known injury.   Pain is mostly around the big toe area, is not the whole foot.  About a week ago it was sore, but improved.   No h/o gout that this is aware of.  Her father had gout.  No fevers/chills. She did take tylenol, but was not helpful.        Past Medical History:  Diagnosis Date   Arthritis    Atrial fibrillation    PAF---currently anticoagulated with Warfarin   Atrial flutter 02/21/2004   s/p mitral valve repair and ppm insertion   Congenital heart block 08/26/2003   Diagnosed by Dr. Shonna Chock. A temp orary pacemaker was placed in 1988 during hysterectomy procedure.   Diabetes mellitus without complication    prediabetic   Diverticulosis    Dyslipidemia    takes Niacin and Crestor daily   History of blood transfusion    no abnormal reaction noted   Hypothyroidism    takes Synthroid daily   Mitral valve insufficiency    Recieved Mitral valve repair with reconstruction of the anterior leaflet cordis with cortex and ring annuloplasty   Nonischemic cardiomyopathy 2006   EF of 35% due to AV dyssyncrony. Has since resolved per most recent ECHO.   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/25/2015   Hypothyroidism 03/01/2015   Papilloma of right breast 11/22/2014   Dyspnea on exertion 09/16/2013   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/06/2012   Pacemaker 06/26/2012   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/26/2012   Upper respiratory infection with cough and congestion 06/26/2012   H/O cardiac catheterization, 2006, prior to valve surgery, with normal coronary arteries 06/26/2012   PAF (paroxysmal atrial fibrillation) 04/13/2012   History of open heart surgery 01/29/2004    Past Surgical History:  Procedure Laterality Date   ABDOMINAL HYSTERECTOMY     BREAST LUMPECTOMY WITH RADIOACTIVE SEED LOCALIZATION Right 11/22/2014   Procedure: RIGHT BREAST LUMPECTOMY WITH RADIOACTIVE SEED LOCALIZATION;  Surgeon: Claud Kelp, MD;  Location: [facility] OR;  Service: General;  Laterality: Right;   CARDIAC CATHETERIZATION  10/20/2003   Minimal decrease in the LV systolic EF of 40-45% with global hyperkinesis. Moderately dilated LV. 3+ Mitral regurgitation. Underfilling of LAD which promptly improved with intracoronary NTG administration. Microvascular angina or microvascular spasm. Otherwise coronaries were normal. No intervention necessary.   CARDIOVASCULAR STRESS TEST  09/14/2003   Performed for the pt's congenital CHB that was discovered during a preoperative evaluation for a colonoscopy.  EKG demonstrated CHB with junctional escape. Nonspecific ST changes noted. Stress ECG was positive for ischemia(2  mm ST depression). Dilated LV cavity. Inability to calculate systolic function. High risk scan. This scan was followed up with a cardiac cath.   COLONOSCOPY     MITRAL VALVE ANNULOPLASTY  02/06/2004   Performed for severe MR requiring mitral valve repair with anterior leaflet reconstruction and a 26 [employer] annuloplasty ring--model: [id number], and an 80% lesion reduction (Dr. Tyrone Sage). Two LV leads were placed epicardially just in the  event that biv pacing would be needed. (One of the leads had been used for during the ppm insertion recieved one week later)    PERMANENT PACEMAKER GENERATOR CHANGE  07/06/2012   [employer]   PERMANENT PACEMAKER GENERATOR CHANGE N/A 07/06/2012   Procedure: PERMANENT PACEMAKER GENERATOR CHANGE;  Surgeon: Thurmon Fair, MD;  Location: [facility] CATH LAB;  Service: Cardiovascular;  Laterality: N/A;   PERMANENT PACEMAKER INSERTION  02/13/2004   PPM implant following a mitral valve annuloplasty. An epicardial active fixation lead (implanted on 02/06/2004 during the mitral valve procedure) was used for this implant.    US ECHOCARDIOGRAPHY  09/10/2010 and1/07/2008   The last echo showed an EF of >55%; RV mildly dilated. Mild to moderately dilated LA  with borderline RA dilation as well. Impaired diastolic function. Normal RV systolic function.MVR without residual insufficiency.No Pericardial effusion.    OB History   No obstetric history on file.      Home Medications    Prior to Admission medications   Medication Sig Start Date End Date Taking? Authorizing Provider  acetaminophen (TYLENOL) 325 MG tablet Take 650 mg by mouth daily as needed for pain.    [provider]  apixaban (ELIQUIS) 5 MG TABS tablet Take 1 tablet (5 mg total) by mouth 2 (two) times daily. 03/14/22   Croitoru, Mihai, MD  cholecalciferol (VITAMIN D) 400 UNITS TABS Take 400 Units by mouth daily.    [provider]  folic acid (FOLVITE) 1 MG tablet TAKE 1 TABLET(1 MG) BY MOUTH DAILY 12/17/21   Croitoru, Rachelle Hora, MD  levothyroxine (SYNTHROID) 75 MCG tablet Take 1 tablet (75 mcg total) by mouth daily before breakfast. 11/20/18   Croitoru, Mihai, MD  niacin 500 MG tablet Take 500 mg by mouth every evening.     [provider]  Omega-3 Fatty Acids (OMEGA 3 PO) Take 1,000 mg by mouth daily.    [provider]  ramipril (ALTACE) 10 MG capsule TAKE 1 CAPSULE(10 MG) BY MOUTH AT BEDTIME 09/19/21   Croitoru, Mihai, MD   rosuvastatin (CRESTOR) 10 MG tablet Take 1 tablet (10 mg total) by mouth every other day. 01/17/22   Croitoru, Mihai, MD  sotalol (BETAPACE) 80 MG tablet TAKE 1 TABLET(80 MG) BY MOUTH TWICE DAILY 09/19/21   Croitoru, Mihai, MD  triamterene-hydrochlorothiazide (MAXZIDE-25) 37.5-25 MG tablet Take 0.5 tablets by mouth daily. 01/17/22   Croitoru, Mihai, MD    Family History Family History  Problem Relation Age of Onset   Cancer - Other Mother    Pneumonia Father    Asthma Brother    Healthy Brother    Asthma Sister    Healthy Brother    Healthy Brother    Healthy Brother     Social History Social History   Tobacco Use   Smoking status: Former    Types: Cigarettes   Smokeless tobacco: Never   Tobacco comments:    quit smoking in 2006  Substance Use Topics   Alcohol use: Yes    Comment: wine occasionally   Drug use: No  Allergies   Patient has no known allergies.   Review of Systems Review of Systems  Constitutional: Negative.   HENT: Negative.    Respiratory: Negative.    Cardiovascular: Negative.   Gastrointestinal: Negative.   Musculoskeletal:  Positive for joint swelling.     Physical Exam Triage Vital Signs ED Triage Vitals [05/10/22 1406]  Enc Vitals Group     BP 126/81     Pulse Rate 72     Resp 17     Temp 97.6 F (36.4 C)     Temp Source Oral     SpO2 98 %     Weight      Height      Head Circumference      Peak Flow      Pain Score 6     Pain Loc      Pain Edu?      Excl. in GC?    No data found.  Updated Vital Signs BP 126/81 (BP Location: Left Arm)   Pulse 72   Temp 97.6 F (36.4 C) (Oral)   Resp 17   SpO2 98%   Visual Acuity Right Eye Distance:   Left Eye Distance:   Bilateral Distance:    Right Eye Near:   Left Eye Near:    Bilateral Near:     Physical Exam Constitutional:      Appearance: Normal appearance.  Musculoskeletal:     Comments: There is generalized swelling to the top of the right foot distally;  there  is TTP to the 1st MTP joint only;  no other TTP noted;  the joint is slightly red, and warm  Skin:    General: Skin is warm.  Neurological:     General: No focal deficit present.     Mental Status: She is alert.  Psychiatric:        Mood and Affect: Mood normal.      UC Treatments / Results  Labs (all labs ordered are listed, but only abnormal results are displayed) Labs Reviewed - No data to display  EKG   Radiology DG Toe Great Right  Result Date: 05/10/2022 CLINICAL DATA:  pain and swelling at base of big toe EXAM: RIGHT GREAT TOE COMPARISON:  None Available. FINDINGS: There is no evidence of acute fracture. There is mild first MTP degenerative change. There is diffuse soft tissue swelling of the foot. There is no bone erosion or frank bony destruction. Punctate calcification adjacent to the first MTP joint medially. IMPRESSION: No acute osseous abnormality. Diffuse soft tissue swelling of the foot. Punctate calcification adjacent to the first MTP joint medially, could reflect early calcific periarthritis or gouty deposition. No bone erosion. Mild first MTP osteoarthritis. Electronically Signed   By: Caprice Renshaw M.D.   On: 05/10/2022 14:58    Procedures Procedures (including critical care time)  Medications Ordered in UC Medications - No data to display  Initial Impression / Assessment and Plan / UC Course  I have reviewed the triage vital signs and the nursing notes.  Pertinent labs & imaging results that were available during my care of the patient were reviewed by me and considered in my medical decision making (see chart for details).    Final Clinical Impressions(s) / UC Diagnoses   Final diagnoses:  Foot pain, right  Gout of big toe     Discharge Instructions      You were seen today for pain in the foot.  I suspect this is  gout.  I have started you on a low dose prednisone to take daily x 5 days, and a short course of colchicine.   I recommend you follow up  with your primary care provider for further discussion.     ED Prescriptions     Medication Sig Dispense Auth. Provider   colchicine 0.6 MG tablet Take 1 tablet (0.6 mg total) by mouth daily for 2 days. 2 tablet Emmalynn Pinkham, MD   predniSONE (DELTASONE) 20 MG tablet Take 1 tablet (20 mg total) by mouth daily for 5 days. 5 tablet Jannifer Franklin, MD      PDMP not reviewed this encounter.   Jannifer Franklin, MD 05/10/22 1504

## 2022-05-10 NOTE — Discharge Instructions (Addendum)
You were seen today for pain in the foot.  I suspect this is gout.  I have started you on a low dose prednisone to take daily x 5 days, and a short course of colchicine.   I recommend you follow up with your primary care provider for further discussion.

## 2022-05-13 ENCOUNTER — Ambulatory Visit (INDEPENDENT_AMBULATORY_CARE_PROVIDER_SITE_OTHER): Payer: Medicare PPO

## 2022-05-13 DIAGNOSIS — I442 Atrioventricular block, complete: Secondary | ICD-10-CM | POA: Diagnosis not present

## 2022-05-14 LAB — CUP PACEART REMOTE DEVICE CHECK
Battery Impedance: 2074 Ohm
Battery Remaining Longevity: 30 mo
Battery Voltage: 2.73 V
Brady Statistic AP VP Percent: 22 %
Brady Statistic AP VS Percent: 0 %
Brady Statistic AS VP Percent: 78 %
Brady Statistic AS VS Percent: 0 %
Date Time Interrogation Session: 20240415123345
Implantable Lead Connection Status: 753985
Implantable Lead Connection Status: 753985
Implantable Lead Implant Date: 20060109
Implantable Lead Implant Date: 20060116
Implantable Lead Location: 753858
Implantable Lead Location: 753859
Implantable Lead Model: 5071
Implantable Lead Model: 5076
Implantable Pulse Generator Implant Date: 20140609
Lead Channel Impedance Value: 444 Ohm
Lead Channel Impedance Value: 475 Ohm
Lead Channel Pacing Threshold Amplitude: 1.125 V
Lead Channel Pacing Threshold Amplitude: 1.25 V
Lead Channel Pacing Threshold Pulse Width: 0.4 ms
Lead Channel Pacing Threshold Pulse Width: 0.4 ms
Lead Channel Setting Pacing Amplitude: 2.25 V
Lead Channel Setting Pacing Amplitude: 2.5 V
Lead Channel Setting Pacing Pulse Width: 0.4 ms
Lead Channel Setting Sensing Sensitivity: 4 mV
Zone Setting Status: 755011
Zone Setting Status: 755011

## 2022-05-24 ENCOUNTER — Encounter: Payer: Self-pay | Admitting: Cardiovascular Disease

## 2022-06-14 NOTE — Progress Notes (Signed)
Remote pacemaker transmission.   

## 2022-07-18 ENCOUNTER — Encounter: Payer: Self-pay | Admitting: Cardiovascular Disease

## 2022-07-18 ENCOUNTER — Ambulatory Visit: Payer: Medicare PPO | Attending: Cardiovascular Disease | Admitting: Cardiovascular Disease

## 2022-07-18 VITALS — BP 122/82 | HR 58 | Ht 63.0 in | Wt 204.8 lb

## 2022-07-18 DIAGNOSIS — I480 Paroxysmal atrial fibrillation: Secondary | ICD-10-CM | POA: Diagnosis not present

## 2022-07-18 DIAGNOSIS — Z9889 Other specified postprocedural states: Secondary | ICD-10-CM

## 2022-07-18 DIAGNOSIS — Z5181 Encounter for therapeutic drug level monitoring: Secondary | ICD-10-CM

## 2022-07-18 DIAGNOSIS — I10 Essential (primary) hypertension: Secondary | ICD-10-CM

## 2022-07-18 DIAGNOSIS — Z79899 Other long term (current) drug therapy: Secondary | ICD-10-CM

## 2022-07-18 DIAGNOSIS — I5042 Chronic combined systolic (congestive) and diastolic (congestive) heart failure: Secondary | ICD-10-CM | POA: Diagnosis not present

## 2022-07-18 DIAGNOSIS — I442 Atrioventricular block, complete: Secondary | ICD-10-CM

## 2022-07-18 DIAGNOSIS — Z950 Presence of cardiac pacemaker: Secondary | ICD-10-CM

## 2022-07-18 NOTE — Progress Notes (Signed)
Patient ID: [name], female   DOB: [birth year], 77 y.o.   MRN: [id number]    Cardiology Office Note    Date:  07/18/2022   ID:  [name], DOB 08-02-1945, MRN [id number]  PCP:  [facility]  Cardiologist:   Thurmon Fair, MD   Chief Complaint  Patient presents with   Pacemaker Check   Atrial Fibrillation    History of Present Illness:  [name] is a 77 y.o. female with a history of previous mitral valve annuloplasty repair, sinus node dysfunction, complete heart block, dual-chamber permanent pacemaker (LV epicardial lead), paroxysmal atrial fibrillation on chronic warfarin anticoagulation and sotalol.  She is doing quite well.  She has not been troubled by palpitations and has not had any serious bleeding problems since her episode of diverticulosis last year.  She denies shortness of breath at rest or with activity, chest pain, focal neurological events or intermittent claudication.  Has not had orthopnea, PND or lower extremity edema.  Has occasional dizziness with standing up too quickly.  Pacemaker interrogation shows normal device function.  Lead parameters are okay and estimated generator longevity is 27 months.  He has a low burden of atrial fibrillation at 1.5%.  She only has 22% atrial pacing but has almost 100% ventricular pacing.  She has had a handful of episodes of nonsustained ventricular tachycardia, roughly 2 or 3 times every month, but the longest one is only 11 beats in duration.  The paced QRS is quite broad at 182 ms but she has a distinct positive R wave in lead V1 (epicardial lead)..  She is on sotalol.  Her QTc today is 526 ms.  This is less than her usual QTc of around 540 ms.  She has a dual-chamber Medtronic Adapta device was implanted in 2014 and her leads were implanted in 2006.  She has a Medtronic 5076 transvenous atrial lead and a Medtronic 5071 epicardial left ventricular lead placed at the time of surgery.   Her most  recent echocardiogram from 02/01/2021 shows unchanged mildly depressed left ventricular systolic function with LVEF 45-50%.  There was a slight increase in the mean transmitral gradients from 2 mmHg in 2017 to 5 mmHg in 2023 (heart rate 60 bpm).   Echo September 2017 shows mildly depressed left ventricular systolic function with EF of 45-50%, at least in part secondary to pacing induced systolic dyssynchrony. There was echo evidence of elevated filling pressures due to grade 2 diastolic dysfunction. The left atrium is mildly dilated with end systolic diameter of 41 mm. there was no evidence of mitral insufficiency and her annuloplasty was not associated with significantly increased mitral gradients.  [name] had mitral valve annuloplasty for severe mitral insufficiency in 2006.  The left atrial appendage was ligated during that procedure.  She did not have coronary disease by preoperative angiography and has normal left ventricular systolic function. Followup echocardiography 2015 and 2017 shows borderline low left ventricular ejection fraction (45-50%). Valve repair looks good. She had a dual chamber permanent pacemaker (Medtronic) implanted in 2006 and has complete heart block . The right atrial lead is a conventional transvenous lead. The ventricular lead is an active fixation epicardial lead on the surface of the left ventricle placed at the time of her surgery. A generator change was performed in 2014. Her pacemaker is located in a deep subpectoral pocket. She has paroxysmal atrial fibrillation. She has treated hypertension, dyslipidemia and type 2 diabetes mellitus on the background  of severe obesity.   Past Medical History:  Diagnosis Date   Arthritis    Atrial fibrillation [facility])    PAF---currently anticoagulated with Warfarin   Atrial flutter (HCC) 02/21/2004   s/p mitral valve repair and ppm insertion   Congenital heart block 08/26/2003   Diagnosed by Dr. Shonna Chock. A temp orary  pacemaker was placed in 1988 during hysterectomy procedure.   Diabetes mellitus without complication (HCC)    prediabetic   Diverticulosis    Dyslipidemia    takes Niacin and Crestor daily   History of blood transfusion    no abnormal reaction noted   Hypothyroidism    takes Synthroid daily   Mitral valve insufficiency    Recieved Mitral valve repair with reconstruction of the anterior leaflet cordis with cortex and ring annuloplasty   Nonischemic cardiomyopathy (HCC) 2006   EF of 35% due to AV dyssyncrony. Has since resolved per most recent ECHO.   RBBB    Due the VP with the LV epicardial lead that was inserted in 2006 during the mitral repair   Systemic hypertension    takes  Ramipril daily-Maxzide every other day   Urinary urgency     Past Surgical History:  Procedure Laterality Date   ABDOMINAL HYSTERECTOMY     BREAST LUMPECTOMY WITH RADIOACTIVE SEED LOCALIZATION Right 11/22/2014   Procedure: RIGHT BREAST LUMPECTOMY WITH RADIOACTIVE SEED LOCALIZATION;  Surgeon: Claud Kelp, MD;  Location: [facility] OR;  Service: General;  Laterality: Right;   CARDIAC CATHETERIZATION  10/20/2003   Minimal decrease in the LV systolic EF of 40-45% with global hyperkinesis. Moderately dilated LV. 3+ Mitral regurgitation. Underfilling of LAD which promptly improved with intracoronary NTG administration. Microvascular angina or microvascular spasm. Otherwise coronaries were normal. No intervention necessary.   CARDIOVASCULAR STRESS TEST  09/14/2003   Performed for the pt's congenital CHB that was discovered during a preoperative evaluation for a colonoscopy.  EKG demonstrated CHB with junctional escape. Nonspecific ST changes noted. Stress ECG was positive for ischemia(2 mm ST depression). Dilated LV cavity. Inability to calculate systolic function. High risk scan. This scan was followed up with a cardiac cath.   COLONOSCOPY     MITRAL VALVE ANNULOPLASTY  02/06/2004   Performed for severe MR requiring mitral  valve repair with anterior leaflet reconstruction and a 26 [employer] annuloplasty ring--model: [id number] [id number], and an 80% lesion reduction (Dr. Tyrone Sage). Two LV leads were placed epicardially just in the event that biv pacing would be needed. (One of the leads had been used for during the ppm insertion recieved one week later)    PERMANENT PACEMAKER GENERATOR CHANGE  07/06/2012   Medtronic   PERMANENT PACEMAKER GENERATOR CHANGE N/A 07/06/2012   Procedure: PERMANENT PACEMAKER GENERATOR CHANGE;  Surgeon: Thurmon Fair, MD;  Location: [facility] CATH LAB;  Service: Cardiovascular;  Laterality: N/A;   PERMANENT PACEMAKER INSERTION  02/13/2004   PPM implant following a mitral valve annuloplasty. An epicardial active fixation lead (implanted on 02/06/2004 during the mitral valve procedure) was used for this implant.    US ECHOCARDIOGRAPHY  09/10/2010 and1/07/2008   The last echo showed an EF of >55%; RV mildly dilated. Mild to moderately dilated LA  with borderline RA dilation as well. Impaired diastolic function. Normal RV systolic function.MVR without residual insufficiency.No Pericardial effusion.    Current Medications: Outpatient Medications Prior to Visit  Medication Sig Dispense Refill   acetaminophen (TYLENOL) 325 MG tablet Take 650 mg by mouth daily as needed for pain.  apixaban (ELIQUIS) 5 MG TABS tablet Take 1 tablet (5 mg total) by mouth 2 (two) times daily. 180 tablet 1   cholecalciferol (VITAMIN D) 400 UNITS TABS Take 400 Units by mouth daily.     folic acid (FOLVITE) 1 MG tablet TAKE 1 TABLET(1 MG) BY MOUTH DAILY 90 tablet 3   levothyroxine (SYNTHROID) 75 MCG tablet Take 1 tablet (75 mcg total) by mouth daily before breakfast. 90 tablet 3   niacin 500 MG tablet Take 500 mg by mouth every evening.      Omega-3 Fatty Acids (OMEGA 3 PO) Take 1,000 mg by mouth daily.     ramipril (ALTACE) 10 MG capsule TAKE 1 CAPSULE(10 MG) BY MOUTH AT BEDTIME 90 capsule 3   rosuvastatin (CRESTOR) 10 MG  tablet Take 1 tablet (10 mg total) by mouth every other day. 90 tablet 3   sotalol (BETAPACE) 80 MG tablet TAKE 1 TABLET(80 MG) BY MOUTH TWICE DAILY 180 tablet 3   triamterene-hydrochlorothiazide (MAXZIDE-25) 37.5-25 MG tablet Take 0.5 tablets by mouth daily. 45 tablet 3   colchicine 0.6 MG tablet Take 1 tablet (0.6 mg total) by mouth daily for 2 days. 2 tablet 0   No facility-administered medications prior to visit.     Allergies:   Patient has no known allergies.   Social History   Socioeconomic History   Marital status: Single    Spouse name: Not on file   Number of children: Not on file   Years of education: Not on file   Highest education level: Not on file  Occupational History   Not on file  Tobacco Use   Smoking status: Former    Types: Cigarettes   Smokeless tobacco: Never   Tobacco comments:    quit smoking in 2006  Substance and Sexual Activity   Alcohol use: Yes    Comment: wine occasionally   Drug use: No   Sexual activity: Yes    Birth control/protection: Surgical  Other Topics Concern   Not on file  Social History Narrative   Not on file   Social Determinants of Health   Financial Resource Strain: Not on file  Food Insecurity: Not on file  Transportation Needs: Not on file  Physical Activity: Not on file  Stress: Not on file  Social Connections: Not on file     Family History:  The patient's family history includes Asthma in her brother and sister; Cancer - Other in her mother; Healthy in her brother, brother, brother, and brother; Pneumonia in her father.   ROS:   Please see the history of present illness.    ROS All other systems are reviewed and are negative.   PHYSICAL EXAM:   VS:  BP 122/82 (BP Location: Left Arm, Patient Position: Sitting, Cuff Size: Large)   Pulse (!) 58   Ht 5\' 3"  (1.6 m)   Wt 204 lb 12.8 oz (92.9 kg)   SpO2 98%   BMI 36.28 kg/m       General: Alert, oriented x3, no distress, severely obese.  Healthy pacemaker  site. Head: no evidence of trauma, PERRL, EOMI, no exophtalmos or lid lag, no myxedema, no xanthelasma; normal ears, nose and oropharynx Neck: normal jugular venous pulsations and no hepatojugular reflux; brisk carotid pulses without delay and no carotid bruits Chest: clear to auscultation, no signs of consolidation by percussion or palpation, normal fremitus, symmetrical and full respiratory excursions Cardiovascular: normal position and quality of the apical impulse, regular rhythm, normal first and second  heart sounds, no murmurs, rubs or gallops Abdomen: no tenderness or distention, no masses by palpation, no abnormal pulsatility or arterial bruits, normal bowel sounds, no hepatosplenomegaly Extremities: no clubbing, cyanosis or edema; 2+ radial, ulnar and brachial pulses bilaterally; 2+ right femoral, posterior tibial and dorsalis pedis pulses; 2+ left femoral, posterior tibial and dorsalis pedis pulses; no subclavian or femoral bruits Neurological: grossly nonfocal Psych: Normal mood and affect     Wt Readings from Last 3 Encounters:  07/18/22 204 lb 12.8 oz (92.9 kg)  01/17/22 202 lb 9.6 oz (91.9 kg)  04/05/21 210 lb (95.3 kg)      Studies/Labs Reviewed:   EKG:  EKG is  ordered today.  It shows atrial sensed, ventricular paced rhythm with QRS duration of about 182 ms and QTc 526 ms  Echocardiogram 02/01/2021:    1. Left ventricular ejection fraction, by estimation, is 45 to 50%. Left  ventricular ejection fraction by 3D volume is 48 %. The left ventricle has  mildly decreased function. The left ventricle demonstrates regional wall  motion abnormalities (see  scoring diagram/findings for description). There is mild left ventricular  hypertrophy. Left ventricular diastolic parameters are consistent with  Grade I diastolic dysfunction (impaired relaxation).   2. Low normal RV free wall strain at -24.7%. Right ventricular systolic  function is mildly reduced. The right  ventricular size is normal.  Tricuspid regurgitation signal is inadequate for assessing PA pressure.   3. The mitral valve has been repaired/replaced. Trivial mitral valve  regurgitation. Mild mitral stenosis. The mean mitral valve gradient is 5.0  mmHg with average heart rate of 68 bpm. There is a prosthetic annuloplasty  ring present in the mitral  position.   4. The aortic valve is tricuspid. Aortic valve regurgitation is trivial.  Aortic valve sclerosis is present, with no evidence of aortic valve  stenosis. Aortic regurgitation PHT measures 442 msec.   5. Aortic dilatation noted. There is borderline dilatation of the  ascending aorta, measuring 40 mm.   6. The inferior vena cava is normal in size with greater than 50%  respiratory variability, suggesting right atrial pressure of 3 mmHg.   Comparison(s): 10/05/15 EF 45-50% MV mean PG.  Recent Labs: No results found for requested labs within last 365 days.   07/23/2021 Hemoglobin A1c 6.2% TSH 3.47 Creatinine 1.06, potassium 3.9, normal liver function tests Cholesterol 158, triglycerides 113, HDL 49, LDL 89  ASSESSMENT:    1. PAF (paroxysmal atrial fibrillation) (HCC)   2. Chronic combined systolic and diastolic heart failure (HCC)   3. CHB (complete heart block) (HCC)   4. Pacemaker   5. Status post mitral valve annuloplasty   6. Encounter for monitoring sotalol therapy   7. Essential hypertension   8. Severe obesity (BMI 35.0-39.9) with comorbidity (HCC)      PLAN:  In order of problems listed above:  Afib: Her burden is lower than usual at only 1.5% and she is asymptomatic.  Plan to continue sotalol.  In addition to paroxysmal atrial fibrillation she sometimes has paroxysmal atrial tachycardia (probably left atrial flutter related to the scar over atriotomy).   She is therapeutically anticoagulated.  Valvular atrial fibrillation, CHA2DS2-VASc score is not relevant.  She has had her appendage clipped so interruption  of anticoagulation should be relatively safe. CHF: Asymptomatic.  NYHA functional class I and euvolemic without diuretics.  Stable left ventricular EF 45-50%, some variation in evaluation, but direct image comparison shows little change. Preop EF was 40-45%, so  current EF is expected. On ACE inhibitor and "beta blocker" (sotalol).  CHB: Pacemaker dependent.  There is no underlying escape rhythm.  Her V pacing lead is an epicardial LV lead. Upgrade to CRT is therefore less likely to be of benefit, except that the QRS complex is remarkably broad. PM: Normal device function.  Continue remote downloads every 3 months and 1-month office visits for QT monitoring. MR s/p annuloplasty: Slight increase in mitral gradients, but no symptoms of mitral stenosis.  Plan to recheck echo in 1 or 2 years, sooner if he develops symptoms of heart failure.. Anticoagulation: No bleeding since her episode of diverticulosis a year ago, on Eliquis.  She did have surgical closure of the left atrial appendage at the time of mitral surgery and only mildly data left atrium. CHADSVasc at least 5 (age 55, gender, LV dysfunction/CHF, hypertension), but is not truly relevant in the setting of valvular heart disease. Sotalol: QT is very broad but this is largely due to the very broad QRS at 190 ms.  Adjusting for this, her QT interval is acceptable.  Has not had any evidence of proarrhythmia.  Reminded her about the potential for multiple drug-drug interactions with sotalol.  Continue checking ECGs every 6 months. HTN: Blood pressure is actually low normal range.  She is on maximum dose ACE inhibitor.  Her sotalol does serve as a "beta-blocker".  In the past we have had to reduce her triamterene-hydrochlorothiazide to half of the current dose due to issues with low blood pressure.  She has occasional orthostatic hypotension.  At this point this does not appear to be severe enough to justify medication changes. Obesity: Weight loss  recommended.   Medication Adjustments/Labs and Tests Ordered: Current medicines are reviewed at length with the patient today.  Concerns regarding medicines are outlined above.  Medication changes, Labs and Tests ordered today are listed in the Patient Instructions below. Patient Instructions  Medication Instructions:  No changes *If you need a refill on your cardiac medications before your next appointment, please call your pharmacy*  Follow-Up: At [facility], you and your health needs are our priority.  As part of our continuing mission to provide you with exceptional heart care, we have created designated Provider Care Teams.  These Care Teams include your primary Cardiologist (physician) and Advanced Practice Providers (APPs -  Physician Assistants and Nurse Practitioners) who all work together to provide you with the care you need, when you need it.  We recommend signing up for the patient portal called "MyChart".  Sign up information is provided on this After Visit Summary.  MyChart is used to connect with patients for Virtual Visits (Telemedicine).  Patients are able to view lab/test results, encounter notes, upcoming appointments, etc.  Non-urgent messages can be sent to your provider as well.   To learn more about what you can do with MyChart, go to [email or web address].    Your next appointment:   6 month(s)  Provider:   Thurmon Fair, MD        Signed, Thurmon Fair, MD  07/18/2022 4:26 PM    [facility] [address] [place], [place], [place]  [address] Phone: [phone]; Fax: [phone]

## 2022-07-18 NOTE — Patient Instructions (Signed)
Medication Instructions:  No changes *If you need a refill on your cardiac medications before your next appointment, please call your pharmacy*   Follow-Up: At [facility], you and your health needs are our priority.  As part of our continuing mission to provide you with exceptional heart care, we have created designated Provider Care Teams.  These Care Teams include your primary Cardiologist (physician) and Advanced Practice Providers (APPs -  Physician Assistants and Nurse Practitioners) who all work together to provide you with the care you need, when you need it.  We recommend signing up for the patient portal called "MyChart".  Sign up information is provided on this After Visit Summary.  MyChart is used to connect with patients for Virtual Visits (Telemedicine).  Patients are able to view lab/test results, encounter notes, upcoming appointments, etc.  Non-urgent messages can be sent to your provider as well.   To learn more about what you can do with MyChart, go to [email or web address].    Your next appointment:   6 month(s)  Provider:   Mihai Croitoru, MD     

## 2022-08-12 ENCOUNTER — Ambulatory Visit (INDEPENDENT_AMBULATORY_CARE_PROVIDER_SITE_OTHER): Payer: Medicare PPO

## 2022-08-12 DIAGNOSIS — I442 Atrioventricular block, complete: Secondary | ICD-10-CM | POA: Diagnosis not present

## 2022-08-13 LAB — CUP PACEART REMOTE DEVICE CHECK
Battery Impedance: 2265 Ohm
Battery Remaining Longevity: 25 mo
Battery Voltage: 2.72 V
Brady Statistic AP VP Percent: 18 %
Brady Statistic AP VS Percent: 0 %
Brady Statistic AS VP Percent: 82 %
Brady Statistic AS VS Percent: 1 %
Date Time Interrogation Session: 20240716102617
Implantable Lead Connection Status: 753985
Implantable Lead Connection Status: 753985
Implantable Lead Implant Date: 20060109
Implantable Lead Implant Date: 20060116
Implantable Lead Location: 753858
Implantable Lead Location: 753859
Implantable Lead Model: 5071
Implantable Lead Model: 5076
Implantable Pulse Generator Implant Date: 20140609
Lead Channel Impedance Value: 409 Ohm
Lead Channel Impedance Value: 441 Ohm
Lead Channel Pacing Threshold Amplitude: 1 V
Lead Channel Pacing Threshold Amplitude: 1.375 V
Lead Channel Pacing Threshold Pulse Width: 0.4 ms
Lead Channel Pacing Threshold Pulse Width: 0.4 ms
Lead Channel Setting Pacing Amplitude: 2 V
Lead Channel Setting Pacing Amplitude: 2.75 V
Lead Channel Setting Pacing Pulse Width: 0.4 ms
Lead Channel Setting Sensing Sensitivity: 4 mV
Zone Setting Status: 755011
Zone Setting Status: 755011

## 2022-08-23 NOTE — Progress Notes (Signed)
Remote pacemaker transmission.   

## 2022-08-26 ENCOUNTER — Other Ambulatory Visit: Payer: Self-pay | Admitting: Cardiovascular Disease

## 2022-11-11 ENCOUNTER — Ambulatory Visit (INDEPENDENT_AMBULATORY_CARE_PROVIDER_SITE_OTHER): Payer: Medicare PPO

## 2022-11-11 DIAGNOSIS — I442 Atrioventricular block, complete: Secondary | ICD-10-CM | POA: Diagnosis not present

## 2022-11-13 LAB — CUP PACEART REMOTE DEVICE CHECK
Battery Impedance: 2556 Ohm
Battery Remaining Longevity: 22 mo
Battery Voltage: 2.73 V
Brady Statistic AP VP Percent: 19 %
Brady Statistic AP VS Percent: 0 %
Brady Statistic AS VP Percent: 80 %
Brady Statistic AS VS Percent: 0 %
Date Time Interrogation Session: 20241015150036
Implantable Lead Connection Status: 753985
Implantable Lead Connection Status: 753985
Implantable Lead Implant Date: 20060109
Implantable Lead Implant Date: 20060116
Implantable Lead Location: 753858
Implantable Lead Location: 753859
Implantable Lead Model: 5071
Implantable Lead Model: 5076
Implantable Pulse Generator Implant Date: 20140609
Lead Channel Impedance Value: 430 Ohm
Lead Channel Impedance Value: 488 Ohm
Lead Channel Pacing Threshold Amplitude: 1.125 V
Lead Channel Pacing Threshold Amplitude: 1.375 V
Lead Channel Pacing Threshold Pulse Width: 0.4 ms
Lead Channel Pacing Threshold Pulse Width: 0.4 ms
Lead Channel Setting Pacing Amplitude: 2.25 V
Lead Channel Setting Pacing Amplitude: 2.75 V
Lead Channel Setting Pacing Pulse Width: 0.4 ms
Lead Channel Setting Sensing Sensitivity: 4 mV
Zone Setting Status: 755011
Zone Setting Status: 755011

## 2022-11-25 NOTE — Progress Notes (Signed)
Remote pacemaker transmission.   

## 2022-12-03 ENCOUNTER — Other Ambulatory Visit: Payer: Self-pay | Admitting: Cardiovascular Disease

## 2023-01-16 ENCOUNTER — Ambulatory Visit: Payer: Medicare PPO | Attending: Cardiovascular Disease | Admitting: Cardiovascular Disease

## 2023-01-16 ENCOUNTER — Encounter: Payer: Self-pay | Admitting: Cardiovascular Disease

## 2023-01-16 VITALS — BP 118/78 | HR 80 | Ht 63.0 in | Wt 204.8 lb

## 2023-01-16 DIAGNOSIS — Z5181 Encounter for therapeutic drug level monitoring: Secondary | ICD-10-CM

## 2023-01-16 DIAGNOSIS — I4719 Other supraventricular tachycardia: Secondary | ICD-10-CM | POA: Diagnosis not present

## 2023-01-16 DIAGNOSIS — I480 Paroxysmal atrial fibrillation: Secondary | ICD-10-CM

## 2023-01-16 DIAGNOSIS — D6869 Other thrombophilia: Secondary | ICD-10-CM

## 2023-01-16 DIAGNOSIS — Z9889 Other specified postprocedural states: Secondary | ICD-10-CM

## 2023-01-16 DIAGNOSIS — Z950 Presence of cardiac pacemaker: Secondary | ICD-10-CM

## 2023-01-16 DIAGNOSIS — I10 Essential (primary) hypertension: Secondary | ICD-10-CM

## 2023-01-16 DIAGNOSIS — I5042 Chronic combined systolic (congestive) and diastolic (congestive) heart failure: Secondary | ICD-10-CM | POA: Diagnosis not present

## 2023-01-16 DIAGNOSIS — I442 Atrioventricular block, complete: Secondary | ICD-10-CM

## 2023-01-16 DIAGNOSIS — Z79899 Other long term (current) drug therapy: Secondary | ICD-10-CM

## 2023-01-16 MED ORDER — FOLIC ACID 1 MG PO TABS: 1.0000 mg | ORAL_TABLET | Freq: Every day | ORAL | 2 refills | Status: AC

## 2023-01-16 MED ORDER — SOTALOL HCL 80 MG PO TABS: 80.0000 mg | ORAL_TABLET | Freq: Two times a day (BID) | ORAL | 3 refills | Status: AC

## 2023-01-16 MED ORDER — APIXABAN 5 MG PO TABS: 5.0000 mg | ORAL_TABLET | Freq: Two times a day (BID) | ORAL | 1 refills | Status: DC

## 2023-01-16 MED ORDER — TRIAMTERENE-HCTZ 37.5-25 MG PO TABS: 0.5000 | ORAL_TABLET | Freq: Every day | ORAL | 3 refills | Status: AC

## 2023-01-16 MED ORDER — ROSUVASTATIN CALCIUM 10 MG PO TABS: 10.0000 mg | ORAL_TABLET | ORAL | Status: AC

## 2023-01-16 NOTE — Patient Instructions (Signed)
Medication Instructions:  No changes *If you need a refill on your cardiac medications before your next appointment, please call your pharmacy*  Follow-Up: At [facility], you and your health needs are our priority.  As part of our continuing mission to provide you with exceptional heart care, we have created designated Provider Care Teams.  These Care Teams include your primary Cardiologist (physician) and Advanced Practice Providers (APPs -  Physician Assistants and Nurse Practitioners) who all work together to provide you with the care you need, when you need it.  We recommend signing up for the patient portal called "MyChart".  Sign up information is provided on this After Visit Summary.  MyChart is used to connect with patients for Virtual Visits (Telemedicine).  Patients are able to view lab/test results, encounter notes, upcoming appointments, etc.  Non-urgent messages can be sent to your provider as well.   To learn more about what you can do with MyChart, go to [email or web address].    Your next appointment:   6 month(s)  Provider:   Thurmon Fair, MD     Other Instructions Meralgia Paresthetica

## 2023-01-16 NOTE — Progress Notes (Signed)
Patient ID: [name], female   DOB: 10/14/1945, 77 y.o.   MRN: [id number]    Cardiology Office Note    Date:  01/16/2023   ID:  [name], DOB 1945/07/20, MRN [id number]  PCP:  [facility]  Cardiologist:   Thurmon Fair, MD   Chief Complaint  Patient presents with   Atrial Fibrillation    History of Present Illness:  [name] is a 77 y.o. female with a history of previous mitral valve annuloplasty repair, sinus node dysfunction, complete heart block, dual-chamber permanent pacemaker (Medtronic Adapta implanted in 2014, original leads from 2006 including LV epicardial lead), paroxysmal atrial fibrillation on chronic warfarin anticoagulation and sotalol.  She is feeling well and does not have palpitations, dizziness, syncope or dyspnea with usual activities.  She has never complained of chest pain.  She denies lower extremity edema, orthopnea, PND, focal neurological complaints or any bleeding problems.  She is compliant with anticoagulation.  Pacemaker interrogation shows normal device function with an estimated generator longevity of 18 months.  She has only about 20% atrial pacing but has 100% ventricular pacing due to complete heart block.  The heart rate histogram distribution is bimodal.  There appears to be mostly normal sinus rhythm, but also a second peak that probably represents paroxysmal atrial tachycardia.  She has very frequent PACs and episodes of PAT.  The burden of atrial fibrillation remains stable at 1.3%.  Lead parameters are within normal range.  She has a very broad paced QRS with a distinct positive R wave in lead V1 consistent with her epicardial LV lead.  Her usual QTc is around 540-550 ms and was up to 576 ms this year, but just not much different from December 2023.  Her most recent echocardiogram from 02/01/2021 shows unchanged mildly depressed left ventricular systolic function with LVEF 45-50%.  There was a slight increase  in the mean transmitral gradients from 2 mmHg in 2017 to 5 mmHg in 2023 (heart rate 60 bpm).   Echo September 2017 shows mildly depressed left ventricular systolic function with EF of 45-50%, at least in part secondary to pacing induced systolic dyssynchrony. There was echo evidence of elevated filling pressures due to grade 2 diastolic dysfunction. The left atrium is mildly dilated with end systolic diameter of 41 mm. there was no evidence of mitral insufficiency and her annuloplasty was not associated with significantly increased mitral gradients.  [name] had mitral valve annuloplasty for severe mitral insufficiency in 2006.  The left atrial appendage was ligated during that procedure.  She did not have coronary disease by preoperative angiography and has normal left ventricular systolic function. Followup echocardiography 2015 and 2017 shows borderline low left ventricular ejection fraction (45-50%). Valve repair looks good. She had a dual chamber permanent pacemaker (Medtronic) implanted in 2006 and has complete heart block . The right atrial lead is a conventional transvenous lead. The ventricular lead is an active fixation epicardial lead on the surface of the left ventricle placed at the time of her surgery. A generator change was performed in 2014. Her pacemaker is located in a deep subpectoral pocket. She has paroxysmal atrial fibrillation. She has treated hypertension, dyslipidemia and type 2 diabetes mellitus on the background of severe obesity.   Past Medical History:  Diagnosis Date   Arthritis    Atrial fibrillation [facility])    PAF---currently anticoagulated with Warfarin   Atrial flutter (HCC) 02/21/2004   s/p mitral valve repair and ppm insertion  Congenital heart block 08/26/2003   Diagnosed by Dr. Shonna Chock. A temp orary pacemaker was placed in 1988 during hysterectomy procedure.   Diabetes mellitus without complication (HCC)    prediabetic   Diverticulosis     Dyslipidemia    takes Niacin and Crestor daily   History of blood transfusion    no abnormal reaction noted   Hypothyroidism    takes Synthroid daily   Mitral valve insufficiency    Recieved Mitral valve repair with reconstruction of the anterior leaflet cordis with cortex and ring annuloplasty   Nonischemic cardiomyopathy (HCC) 2006   EF of 35% due to AV dyssyncrony. Has since resolved per most recent ECHO.   RBBB    Due the VP with the LV epicardial lead that was inserted in 2006 during the mitral repair   Systemic hypertension    takes  Ramipril daily-Maxzide every other day   Urinary urgency     Past Surgical History:  Procedure Laterality Date   ABDOMINAL HYSTERECTOMY     BREAST LUMPECTOMY WITH RADIOACTIVE SEED LOCALIZATION Right 11/22/2014   Procedure: RIGHT BREAST LUMPECTOMY WITH RADIOACTIVE SEED LOCALIZATION;  Surgeon: Claud Kelp, MD;  Location: [facility] OR;  Service: General;  Laterality: Right;   CARDIAC CATHETERIZATION  10/20/2003   Minimal decrease in the LV systolic EF of 40-45% with global hyperkinesis. Moderately dilated LV. 3+ Mitral regurgitation. Underfilling of LAD which promptly improved with intracoronary NTG administration. Microvascular angina or microvascular spasm. Otherwise coronaries were normal. No intervention necessary.   CARDIOVASCULAR STRESS TEST  09/14/2003   Performed for the pt's congenital CHB that was discovered during a preoperative evaluation for a colonoscopy.  EKG demonstrated CHB with junctional escape. Nonspecific ST changes noted. Stress ECG was positive for ischemia(2 mm ST depression). Dilated LV cavity. Inability to calculate systolic function. High risk scan. This scan was followed up with a cardiac cath.   COLONOSCOPY     MITRAL VALVE ANNULOPLASTY  02/06/2004   Performed for severe MR requiring mitral valve repair with anterior leaflet reconstruction and a 26 [employer] annuloplasty ring--model: [id number], and an 80% lesion  reduction (Dr. Tyrone Sage). Two LV leads were placed epicardially just in the event that biv pacing would be needed. (One of the leads had been used for during the ppm insertion recieved one week later)    PERMANENT PACEMAKER GENERATOR CHANGE  07/06/2012   Medtronic   PERMANENT PACEMAKER GENERATOR CHANGE N/A 07/06/2012   Procedure: PERMANENT PACEMAKER GENERATOR CHANGE;  Surgeon: Thurmon Fair, MD;  Location: [facility] CATH LAB;  Service: Cardiovascular;  Laterality: N/A;   PERMANENT PACEMAKER INSERTION  02/13/2004   PPM implant following a mitral valve annuloplasty. An epicardial active fixation lead (implanted on 02/06/2004 during the mitral valve procedure) was used for this implant.    US ECHOCARDIOGRAPHY  09/10/2010 and1/07/2008   The last echo showed an EF of >55%; RV mildly dilated. Mild to moderately dilated LA  with borderline RA dilation as well. Impaired diastolic function. Normal RV systolic function.MVR without residual insufficiency.No Pericardial effusion.    Current Medications: Outpatient Medications Prior to Visit  Medication Sig Dispense Refill   acetaminophen (TYLENOL) 325 MG tablet Take 650 mg by mouth daily as needed for pain.     cholecalciferol (VITAMIN D) 400 UNITS TABS Take 400 Units by mouth daily.     levothyroxine (SYNTHROID) 75 MCG tablet Take 1 tablet (75 mcg total) by mouth daily before breakfast. 90 tablet 3   niacin 500 MG tablet Take 500  mg by mouth every evening.      Omega-3 Fatty Acids (OMEGA 3 PO) Take 1,000 mg by mouth daily.     ramipril (ALTACE) 10 MG capsule TAKE 1 CAPSULE(10 MG) BY MOUTH AT BEDTIME 90 capsule 3   apixaban (ELIQUIS) 5 MG TABS tablet Take 1 tablet (5 mg total) by mouth 2 (two) times daily. 180 tablet 1   folic acid (FOLVITE) 1 MG tablet TAKE 1 TABLET(1 MG) BY MOUTH DAILY 90 tablet 2   rosuvastatin (CRESTOR) 10 MG tablet Take 1 tablet (10 mg total) by mouth every other day. 90 tablet 3   sotalol (BETAPACE) 80 MG tablet TAKE 1 TABLET(80 MG) BY MOUTH  TWICE DAILY 180 tablet 3   triamterene-hydrochlorothiazide (MAXZIDE-25) 37.5-25 MG tablet Take 0.5 tablets by mouth daily. 45 tablet 3   No facility-administered medications prior to visit.     Allergies:   Patient has no known allergies.   Social History   Socioeconomic History   Marital status: Single    Spouse name: Not on file   Number of children: Not on file   Years of education: Not on file   Highest education level: Not on file  Occupational History   Not on file  Tobacco Use   Smoking status: Former    Types: Cigarettes   Smokeless tobacco: Never   Tobacco comments:    quit smoking in 2006  Substance and Sexual Activity   Alcohol use: Yes    Comment: wine occasionally   Drug use: No   Sexual activity: Yes    Birth control/protection: Surgical  Other Topics Concern   Not on file  Social History Narrative   Not on file   Social Drivers of Health   Financial Resource Strain: Not on file  Food Insecurity: Not on file  Transportation Needs: Not on file  Physical Activity: Not on file  Stress: Not on file  Social Connections: Not on file     Family History:  The patient's family history includes Asthma in her brother and sister; Cancer - Other in her mother; Healthy in her brother, brother, brother, and brother; Pneumonia in her father.   ROS:   Please see the history of present illness.    ROS All other systems are reviewed and are negative.   PHYSICAL EXAM:   VS:  BP 118/78   Pulse 80   Ht 5\' 3"  (1.6 m)   Wt 204 lb 12.8 oz (92.9 kg)   SpO2 97%   BMI 36.28 kg/m       General: Alert, oriented x3, no distress, severely obese.  Healthy pacemaker site. Head: no evidence of trauma, PERRL, EOMI, no exophtalmos or lid lag, no myxedema, no xanthelasma; normal ears, nose and oropharynx Neck: normal jugular venous pulsations and no hepatojugular reflux; brisk carotid pulses without delay and no carotid bruits Chest: clear to auscultation, no signs of  consolidation by percussion or palpation, normal fremitus, symmetrical and full respiratory excursions Cardiovascular: normal position and quality of the apical impulse, regular rhythm, normal first and second heart sounds, no murmurs, rubs or gallops Abdomen: no tenderness or distention, no masses by palpation, no abnormal pulsatility or arterial bruits, normal bowel sounds, no hepatosplenomegaly Extremities: no clubbing, cyanosis or edema; 2+ radial, ulnar and brachial pulses bilaterally; 2+ right femoral, posterior tibial and dorsalis pedis pulses; 2+ left femoral, posterior tibial and dorsalis pedis pulses; no subclavian or femoral bruits Neurological: grossly nonfocal Psych: Normal mood and affect  Wt Readings from Last 3 Encounters:  01/16/23 204 lb 12.8 oz (92.9 kg)  07/18/22 204 lb 12.8 oz (92.9 kg)  01/17/22 202 lb 9.6 oz (91.9 kg)      Studies/Labs Reviewed:   EKG:    EKG Interpretation Date/Time:  Thursday January 16 2023 11:09:09 EST Ventricular Rate:  80 PR Interval:    QRS Duration:  194 QT Interval:  498 QTC Calculation: 574 R Axis:   187  Text Interpretation: Sinus rhythm with complete heart block and Ventricular-paced rhythm When compared with ECG of 18-Jul-2022 11:00, Sinus rhythm is now with complete heart block Vent. rate has increased BY  22 BPM Confirmed by Madilyne Tadlock ([id number]) on 01/16/2023 11:25:22 AM         Echocardiogram 02/01/2021:    1. Left ventricular ejection fraction, by estimation, is 45 to 50%. Left  ventricular ejection fraction by 3D volume is 48 %. The left ventricle has  mildly decreased function. The left ventricle demonstrates regional wall  motion abnormalities (see  scoring diagram/findings for description). There is mild left ventricular  hypertrophy. Left ventricular diastolic parameters are consistent with  Grade I diastolic dysfunction (impaired relaxation).   2. Low normal RV free wall strain at -24.7%. Right  ventricular systolic  function is mildly reduced. The right ventricular size is normal.  Tricuspid regurgitation signal is inadequate for assessing PA pressure.   3. The mitral valve has been repaired/replaced. Trivial mitral valve  regurgitation. Mild mitral stenosis. The mean mitral valve gradient is 5.0  mmHg with average heart rate of 68 bpm. There is a prosthetic annuloplasty  ring present in the mitral  position.   4. The aortic valve is tricuspid. Aortic valve regurgitation is trivial.  Aortic valve sclerosis is present, with no evidence of aortic valve  stenosis. Aortic regurgitation PHT measures 442 msec.   5. Aortic dilatation noted. There is borderline dilatation of the  ascending aorta, measuring 40 mm.   6. The inferior vena cava is normal in size with greater than 50%  respiratory variability, suggesting right atrial pressure of 3 mmHg.   Comparison(s): 10/05/15 EF 45-50% MV mean PG.  Recent Labs: No results found for requested labs within last 365 days.   07/23/2021 Hemoglobin A1c 6.2% TSH 3.47 Creatinine 1.06, potassium 3.9, normal liver function tests Cholesterol 158, triglycerides 113, HDL 49, LDL 89   CHA2DS2-VASc Score = 5  The patient's score is based upon: CHF History: 1 HTN History: 1 Diabetes History: 0 Stroke History: 0 Vascular Disease History: 0 Age Score: 2 Gender Score: 1       ASSESSMENT AND PLAN: Paroxysmal Atrial Fibrillation (ICD10:  I48.0) The patient's CHA2DS2-VASc score is 5, indicating a 7.2% annual risk of stroke.    Secondary Hypercoagulable State (ICD10:  D68.69) The patient is at significant risk for stroke/thromboembolism based upon her CHA2DS2-VASc Score of 5.  Continue Apixaban (Eliquis).      ASSESSMENT:    1. PAF (paroxysmal atrial fibrillation) (HCC)   2. Paroxysmal atrial tachycardia (HCC)   3. Chronic combined systolic and diastolic heart failure (HCC)   4. CHB (complete heart block) (HCC)   5. Pacemaker   6.  Status post mitral valve annuloplasty   7. Acquired thrombophilia (HCC)   8. Encounter for monitoring sotalol therapy   9. Benign essential hypertension   10. Severe obesity (BMI 35.0-39.9) with comorbidity (HCC)      PLAN:  In order of problems listed above:  Afib: Continues to have a  very low burden of asymptomatic atrial fibrillation.  Plan to continue sotalol.  There also appears to be a high burden of paroxysmal atrial tachycardia which explains the increased frequency of ventricular rates around 100-120 bpm (probably left atrial flutter related to the scar over atriotomy).   She is therapeutically anticoagulated.  She has had her appendage clipped so interruption of anticoagulation should be relatively safe. CHF: NYHA functional class I/asymptomatic, euvolemic without loop diuretics.  Stable left ventricular EF 45-50%, some variation in evaluation, but direct image comparison shows little change. Preop EF was 40-45%, so current EF is expected. On ACE inhibitor and "beta blocker" (sotalol).  CHB: Pacemaker dependent.  There is no underlying escape rhythm.  Her V pacing lead is an epicardial LV lead. Upgrade to CRT is therefore less likely to be of benefit, except that the QRS complex is remarkably broad. PM: Reduced the upper tracking rate to 105 bpm, to avoid tachycardia from the atrial arrhythmia.  Left the sensor-driven rate at 130 bpm normal device function.  Continue remote downloads every 3 months and 7-month office visits for QT monitoring. MR s/p annuloplasty: Slight increase in mitral gradients, but no symptoms of mitral stenosis.  Plan to check her echo in about another years time, sooner if she becomes symptomatic. Anticoagulation: No bleeding since her episode of diverticulosis a year ago, on Eliquis.  She did have surgical closure of the left atrial appendage at the time of mitral surgery and only mildly data left atrium. CHADSVasc at least 5 (age 43, gender, LV dysfunction/CHF,  hypertension), but is not truly relevant in the setting of valvular heart disease. Sotalol: QT is longer than usual today, but this is again mostly is very broad but this is largely due to the very broad QRS at 194 ms.  Adjusting for this, her QT interval is acceptable.  I do not think we should increase her dose of sotalol however.  Continue checking ECGs every 6 months.  Reminded her of the risk of drug interactions. HTN: Well-controlled Obesity: Weight loss recommended.   Medication Adjustments/Labs and Tests Ordered: Current medicines are reviewed at length with the patient today.  Concerns regarding medicines are outlined above.  Medication changes, Labs and Tests ordered today are listed in the Patient Instructions below. Patient Instructions  Medication Instructions:  No changes *If you need a refill on your cardiac medications before your next appointment, please call your pharmacy*  Follow-Up: At [facility], you and your health needs are our priority.  As part of our continuing mission to provide you with exceptional heart care, we have created designated Provider Care Teams.  These Care Teams include your primary Cardiologist (physician) and Advanced Practice Providers (APPs -  Physician Assistants and Nurse Practitioners) who all work together to provide you with the care you need, when you need it.  We recommend signing up for the patient portal called "MyChart".  Sign up information is provided on this After Visit Summary.  MyChart is used to connect with patients for Virtual Visits (Telemedicine).  Patients are able to view lab/test results, encounter notes, upcoming appointments, etc.  Non-urgent messages can be sent to your provider as well.   To learn more about what you can do with MyChart, go to [email or web address].    Your next appointment:   6 month(s)  Provider:   Thurmon Fair, MD     Other Instructions Meralgia Paresthetica         Signed, Thurmon Fair, MD  01/16/2023  3:26 PM    [facility] [address] [place], [place], [place]  [address] Phone: [phone]; Fax: [phone]

## 2023-01-17 ENCOUNTER — Encounter: Payer: Medicare PPO | Admitting: Cardiovascular Disease

## 2023-02-10 ENCOUNTER — Ambulatory Visit (INDEPENDENT_AMBULATORY_CARE_PROVIDER_SITE_OTHER): Payer: Medicare PPO

## 2023-02-10 DIAGNOSIS — I4719 Other supraventricular tachycardia: Secondary | ICD-10-CM

## 2023-02-10 DIAGNOSIS — I442 Atrioventricular block, complete: Secondary | ICD-10-CM

## 2023-02-10 LAB — CUP PACEART REMOTE DEVICE CHECK
Battery Impedance: 2890 Ohm
Battery Remaining Longevity: 22 mo
Battery Voltage: 2.71 V
Brady Statistic AP VP Percent: 28 %
Brady Statistic AP VS Percent: 0 %
Brady Statistic AS VP Percent: 71 %
Brady Statistic AS VS Percent: 1 %
Date Time Interrogation Session: 20250113131729
Implantable Lead Connection Status: 753985
Implantable Lead Connection Status: 753985
Implantable Lead Implant Date: 20060109
Implantable Lead Implant Date: 20060116
Implantable Lead Location: 753858
Implantable Lead Location: 753859
Implantable Lead Model: 5071
Implantable Lead Model: 5076
Implantable Pulse Generator Implant Date: 20140609
Lead Channel Impedance Value: 464 Ohm
Lead Channel Impedance Value: 507 Ohm
Lead Channel Pacing Threshold Amplitude: 0.875 V
Lead Channel Pacing Threshold Amplitude: 1.25 V
Lead Channel Pacing Threshold Pulse Width: 0.4 ms
Lead Channel Pacing Threshold Pulse Width: 0.4 ms
Lead Channel Setting Pacing Amplitude: 2 V
Lead Channel Setting Pacing Amplitude: 2.5 V
Lead Channel Setting Pacing Pulse Width: 0.4 ms
Lead Channel Setting Sensing Sensitivity: 4 mV
Zone Setting Status: 755011
Zone Setting Status: 755011

## 2023-03-24 NOTE — Progress Notes (Signed)
 Remote pacemaker transmission.

## 2023-04-14 ENCOUNTER — Encounter

## 2023-04-15 ENCOUNTER — Other Ambulatory Visit: Payer: Self-pay | Admitting: Cardiovascular Disease

## 2023-04-16 NOTE — Telephone Encounter (Signed)
 Prescription refill request for Eliquis received. Indication:afib Last office visit:12/24 Scr:1.11  1/25 Age: 78 Weight:92.9  kg  Prescription refilled

## 2023-04-25 ENCOUNTER — Ambulatory Visit: Admitting: Physician Assistant

## 2023-04-25 ENCOUNTER — Encounter: Payer: Self-pay | Admitting: Physician Assistant

## 2023-04-25 ENCOUNTER — Other Ambulatory Visit (INDEPENDENT_AMBULATORY_CARE_PROVIDER_SITE_OTHER): Payer: Self-pay

## 2023-04-25 DIAGNOSIS — M25562 Pain in left knee: Secondary | ICD-10-CM

## 2023-04-25 DIAGNOSIS — G8929 Other chronic pain: Secondary | ICD-10-CM | POA: Diagnosis not present

## 2023-04-25 DIAGNOSIS — M1712 Unilateral primary osteoarthritis, left knee: Secondary | ICD-10-CM | POA: Diagnosis not present

## 2023-04-25 MED ORDER — METHYLPREDNISOLONE ACETATE 40 MG/ML IJ SUSP
40.0000 mg | INTRAMUSCULAR | Status: AC | PRN
  Administered 2023-04-25: 40 mg via INTRA_ARTICULAR

## 2023-04-25 MED ORDER — LIDOCAINE HCL 1 % IJ SOLN
3.0000 mL | INTRAMUSCULAR | Status: AC | PRN
  Administered 2023-04-25: 3 mL

## 2023-04-25 NOTE — Progress Notes (Signed)
 Office Visit Note   Patient: [name]           Date of Birth: [birth year]           MRN: [id number] Visit Date: 04/25/2023              Requested by: Diamantina Providence, FNP [id number] [place],  [place] [address] PCP: [facility]   Assessment & Plan: Visit Diagnoses:  1. Chronic pain of left knee   2. Unilateral primary osteoarthritis, left knee     Plan: [name] is a very pleasant 78 year old woman with a 1 month history of left knee pain.  She denies any particular injury or change in activity.  She does get some mechanical symptoms on the medial side of the knee including popping and locking.  Notices it is worse first thing in the morning.  X-rays show some medial joint space narrowing but no advanced degenerative changes.  Findings today consistent with some arthritis medially or perhaps a degenerative meniscus tear.  We talked about options.  I would like to try an injection if she would like 1 today.  If that does not work she will contact me would send her to physical therapy.  She has never had problems really with the knee in the past so she continued to have mechanical symptoms could consider an MRI  Follow-Up Instructions: Return if symptoms worsen or fail to improve.   Orders:  Orders Placed This Encounter  Procedures   XR KNEE 3 VIEW LEFT   No orders of the defined types were placed in this encounter.     Procedures: Large Joint Inj: L knee on 04/25/2023 10:59 AM Indications: pain and diagnostic evaluation Details: 25 G 1.5 in needle, anteromedial approach  Arthrogram: No  Medications: 40 mg methylPREDNISolone acetate 40 MG/ML; 3 mL lidocaine 1 % Outcome: tolerated well, no immediate complications Procedure, treatment alternatives, risks and benefits explained, specific risks discussed. Consent was given by the patient.       Clinical Data: No additional findings.   Subjective: Chief Complaint  Patient presents with    Left Knee - Pain    Patient in today with pain in the left knee. She has been having pain now for 1 month. She is taking tylenol as needed . She feels pain when waking up in the back of the knee as well as the front of both sides of the knee. She feels popping and clicking at times     HPI Pleasant 78 year old woman with a 1 month history of left medial knee pain and popping.  She thought she had some fluid in at that is better.  She also had pain in the back of the knee.  Denies any calf pain.  She is on chronic anticoagulation so cannot use anti-inflammatories Review of Systems  All other systems reviewed and are negative.    Objective: Vital Signs: There were no vitals taken for this visit.  Physical Exam Pulmonary:     Effort: Pulmonary effort is normal.  Skin:    General: Skin is warm and dry.  Neurological:     General: No focal deficit present.     Mental Status: She is alert and oriented to person, place, and time.  Psychiatric:        Mood and Affect: Mood normal.        Behavior: Behavior normal.     Ortho Exam Left knee neurovascular intact compartments  are soft and nontender no redness no erythema no effusion she has pain with terminal flexion and extension reproduced on the medial side of the knee. Specialty Comments:  No specialty comments available.  Imaging: XR KNEE 3 VIEW LEFT Result Date: 04/25/2023 Radiographs of the left knee demonstrate some medial joint space narrowing early sclerotic changes no acute changes    PMFS History: Patient Active Problem List   Diagnosis Date Noted   Unilateral primary osteoarthritis, left knee 04/25/2023   Diverticulitis of colon with bleeding 04/05/2021   Obesity (BMI 30-39.9) 04/05/2021   Hyperglycemia 04/05/2021   History of cataract extraction 09/29/2019   Hypercholesterolemia 05/07/2016   Benign essential hypertension 08/28/2015   History of vitamin D deficiency 08/28/2015   Chronic combined systolic and  diastolic heart failure (HCC) 05/25/2015   Hypothyroidism 03/01/2015   Papilloma of right breast 11/22/2014   Dyspnea on exertion 09/16/2013   Complete heart block (HCC) 07/06/2012   Pacemaker 06/26/2012   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/26/2012   Upper respiratory infection with cough and congestion 06/26/2012   H/O cardiac catheterization, 2006, prior to valve surgery, with normal coronary arteries 06/26/2012   PAF (paroxysmal atrial fibrillation) (HCC) 04/13/2012   History of open heart surgery 01/29/2004   Past Medical History:  Diagnosis Date   Arthritis    Atrial fibrillation (HCC)    PAF---currently anticoagulated with Warfarin   Atrial flutter (HCC) 02/21/2004   s/p mitral valve repair and ppm insertion   Congenital heart block 08/26/2003   Diagnosed by Dr. Shonna Chock. A temp orary pacemaker was placed in 1988 during hysterectomy procedure.   Diabetes mellitus without complication (HCC)    prediabetic   Diverticulosis    Dyslipidemia    takes Niacin and Crestor daily   History of blood transfusion    no abnormal reaction noted   Hypothyroidism    takes Synthroid daily   Mitral valve insufficiency    Recieved Mitral valve repair with reconstruction of the anterior leaflet cordis with cortex and ring annuloplasty   Nonischemic cardiomyopathy (HCC) 2006   EF of 35% due to AV dyssyncrony. Has since resolved per most recent ECHO.   RBBB    Due the VP with the LV epicardial lead that was inserted in 2006 during the mitral repair   Systemic hypertension    takes  Ramipril daily-Maxzide every other day   Urinary urgency     Family History  Problem Relation Age of Onset   Cancer - Other Mother    Pneumonia Father    Asthma Brother    Healthy Brother    Asthma Sister    Healthy Brother    Healthy Brother    Healthy Brother     Past Surgical History:  Procedure Laterality Date   ABDOMINAL HYSTERECTOMY     BREAST LUMPECTOMY WITH RADIOACTIVE SEED  LOCALIZATION Right 11/22/2014   Procedure: RIGHT BREAST LUMPECTOMY WITH RADIOACTIVE SEED LOCALIZATION;  Surgeon: Claud Kelp, MD;  Location: [facility] OR;  Service: General;  Laterality: Right;   CARDIAC CATHETERIZATION  10/20/2003   Minimal decrease in the LV systolic EF of 40-45% with global hyperkinesis. Moderately dilated LV. 3+ Mitral regurgitation. Underfilling of LAD which promptly improved with intracoronary NTG administration. Microvascular angina or microvascular spasm. Otherwise coronaries were normal. No intervention necessary.   CARDIOVASCULAR STRESS TEST  09/14/2003   Performed for the pt's congenital CHB that was discovered during a preoperative evaluation for a colonoscopy.  EKG demonstrated CHB with junctional escape. Nonspecific ST  changes noted. Stress ECG was positive for ischemia(2 mm ST depression). Dilated LV cavity. Inability to calculate systolic function. High risk scan. This scan was followed up with a cardiac cath.   COLONOSCOPY     MITRAL VALVE ANNULOPLASTY  02/06/2004   Performed for severe MR requiring mitral valve repair with anterior leaflet reconstruction and a 26 [employer] annuloplasty ring--model: 4100 [id number], and an 80% lesion reduction (Dr. Tyrone Sage). Two LV leads were placed epicardially just in the event that biv pacing would be needed. (One of the leads had been used for during the ppm insertion recieved one week later)    PERMANENT PACEMAKER GENERATOR CHANGE  07/06/2012   [employer]onic   PERMANENT PACEMAKER GENERATOR CHANGE N/A 07/06/2012   Procedure: PERMANENT PACEMAKER GENERATOR CHANGE;  Surgeon: Thurmon Fair, MD;  Location: [facility] CATH [facility];  Service: Cardiovascular;  Laterality: N/A;   PERMANENT PACEMAKER INSERTION  02/13/2004   PPM implant following a mitral valve annuloplasty. An epicardial active fixation lead (implanted on 02/06/2004 during the mitral valve procedure) was used for this implant.    US ECHOCARDIOGRAPHY  09/10/2010 and1/07/2008   The last echo  showed an EF of >55%; RV mildly dilated. Mild to moderately dilated LA  with borderline RA dilation as well. Impaired diastolic function. Normal RV systolic function.MVR without residual insufficiency.No Pericardial effusion.   Social History   Occupational History   Not on file  Tobacco Use   Smoking status: Former    Types: Cigarettes   Smokeless tobacco: Never   Tobacco comments:    quit smoking in 2006  Substance and Sexual Activity   Alcohol use: Yes    Comment: wine occasionally   Drug use: No   Sexual activity: Yes    Birth control/protection: Surgical

## 2023-05-12 ENCOUNTER — Ambulatory Visit (INDEPENDENT_AMBULATORY_CARE_PROVIDER_SITE_OTHER): Payer: Medicare PPO

## 2023-05-12 DIAGNOSIS — I442 Atrioventricular block, complete: Secondary | ICD-10-CM

## 2023-05-14 LAB — CUP PACEART REMOTE DEVICE CHECK
Battery Impedance: 3237 Ohm
Battery Remaining Longevity: 19 mo
Battery Voltage: 2.71 V
Brady Statistic AP VP Percent: 29 %
Brady Statistic AP VS Percent: 0 %
Brady Statistic AS VP Percent: 70 %
Brady Statistic AS VS Percent: 1 %
Date Time Interrogation Session: 20250415104850
Implantable Lead Connection Status: 753985
Implantable Lead Connection Status: 753985
Implantable Lead Implant Date: 20060109
Implantable Lead Implant Date: 20060116
Implantable Lead Location: 753858
Implantable Lead Location: 753859
Implantable Lead Model: 5071
Implantable Lead Model: 5076
Implantable Pulse Generator Implant Date: 20140609
Lead Channel Impedance Value: 444 Ohm
Lead Channel Impedance Value: 480 Ohm
Lead Channel Pacing Threshold Amplitude: 1 V
Lead Channel Pacing Threshold Amplitude: 1.25 V
Lead Channel Pacing Threshold Pulse Width: 0.4 ms
Lead Channel Pacing Threshold Pulse Width: 0.4 ms
Lead Channel Setting Pacing Amplitude: 2 V
Lead Channel Setting Pacing Amplitude: 2.5 V
Lead Channel Setting Pacing Pulse Width: 0.4 ms
Lead Channel Setting Sensing Sensitivity: 4 mV
Zone Setting Status: 755011
Zone Setting Status: 755011

## 2023-05-15 ENCOUNTER — Ambulatory Visit (INDEPENDENT_AMBULATORY_CARE_PROVIDER_SITE_OTHER)

## 2023-05-15 DIAGNOSIS — I442 Atrioventricular block, complete: Secondary | ICD-10-CM | POA: Diagnosis not present

## 2023-05-17 ENCOUNTER — Encounter: Payer: Self-pay | Admitting: Cardiovascular Disease

## 2023-06-16 ENCOUNTER — Ambulatory Visit (INDEPENDENT_AMBULATORY_CARE_PROVIDER_SITE_OTHER)

## 2023-06-16 DIAGNOSIS — I442 Atrioventricular block, complete: Secondary | ICD-10-CM

## 2023-06-18 LAB — CUP PACEART REMOTE DEVICE CHECK
Battery Impedance: 3337 Ohm
Battery Remaining Longevity: 18 mo
Battery Voltage: 2.71 V
Brady Statistic AP VP Percent: 28 %
Brady Statistic AP VS Percent: 0 %
Brady Statistic AS VP Percent: 71 %
Brady Statistic AS VS Percent: 1 %
Date Time Interrogation Session: 20250521102732
Implantable Lead Connection Status: 753985
Implantable Lead Connection Status: 753985
Implantable Lead Implant Date: 20060109
Implantable Lead Implant Date: 20060116
Implantable Lead Location: 753858
Implantable Lead Location: 753859
Implantable Lead Model: 5071
Implantable Lead Model: 5076
Implantable Pulse Generator Implant Date: 20140609
Lead Channel Impedance Value: 426 Ohm
Lead Channel Impedance Value: 484 Ohm
Lead Channel Pacing Threshold Amplitude: 1 V
Lead Channel Pacing Threshold Amplitude: 1.25 V
Lead Channel Pacing Threshold Pulse Width: 0.4 ms
Lead Channel Pacing Threshold Pulse Width: 0.4 ms
Lead Channel Setting Pacing Amplitude: 2 V
Lead Channel Setting Pacing Amplitude: 2.5 V
Lead Channel Setting Pacing Pulse Width: 0.4 ms
Lead Channel Setting Sensing Sensitivity: 4 mV
Zone Setting Status: 755011
Zone Setting Status: 755011

## 2023-06-19 ENCOUNTER — Ambulatory Visit: Admitting: Physician Assistant

## 2023-06-23 ENCOUNTER — Ambulatory Visit: Payer: Self-pay | Admitting: Cardiovascular Disease

## 2023-06-24 ENCOUNTER — Encounter: Payer: Self-pay | Admitting: Physician Assistant

## 2023-06-24 ENCOUNTER — Ambulatory Visit: Admitting: Physician Assistant

## 2023-06-24 DIAGNOSIS — G8929 Other chronic pain: Secondary | ICD-10-CM | POA: Diagnosis not present

## 2023-06-24 DIAGNOSIS — M25562 Pain in left knee: Secondary | ICD-10-CM

## 2023-06-24 DIAGNOSIS — M1712 Unilateral primary osteoarthritis, left knee: Secondary | ICD-10-CM

## 2023-06-24 NOTE — Progress Notes (Signed)
 Office Visit Note   Patient: [name]           Date of Birth: 01-17-1946           MRN: [id number] Visit Date: 06/24/2023              Requested by: [facility] [address] [place],  [place] [address] PCP: [facility]   Assessment & Plan: Visit Diagnoses:  1. Chronic pain of left knee   2. Unilateral primary osteoarthritis, left knee     Plan: [name] is in follow-up today for her left knee.  I did give her an injection in March and her left knee she says she only got about 3 weeks of relief.  Her pain is focused medially.  She does have quite a bit of pain over palpation of the posterior medial joint line describes some feelings of instability.  Been going on some months but not years.  Her x-rays show some degenerative changes but not significant.  That being said she does have narrowing of the medial joint.  I would like for her to try physical therapy and she is willing to do this.  If she got no better would get an MRI to discern whether this was more arthritic or degenerative tear of her meniscus  Follow-Up Instructions: Return in about 1 month (around 07/25/2023).   Orders:  Orders Placed This Encounter  Procedures   Ambulatory referral to Physical Therapy   No orders of the defined types were placed in this encounter.     Procedures: No procedures performed   Clinical Data: No additional findings.   Subjective: No chief complaint on file.   HPI [name] is a pleasant 78 year old woman comes in today for follow-up on her left knee pain she last had an injection in March.  She feels that it only lasted about 3 weeks no new injury  Review of Systems  All other systems reviewed and are negative.    Objective: Vital Signs: There were no vitals taken for this visit.  Physical Exam Constitutional:      Appearance: Normal appearance.  Pulmonary:     Effort: Pulmonary effort is normal.   Skin:    General: Skin is warm and dry.  Neurological:     General: No focal deficit present.     Mental Status: She is alert and oriented to person, place, and time.  Psychiatric:        Mood and Affect: Mood normal.        Behavior: Behavior normal.     Ortho Exam Left knee she has no effusion no erythema compartments are soft and compressible she is neurovascularly intact distally no evidence of any cellulitis or infection.  Strength is intact.  She is tender to palpation of the posterior medial joint line good varus valgus and anterior stability Specialty Comments:  No specialty comments available.  Imaging: No results found.   PMFS History: Patient Active Problem List   Diagnosis Date Noted   Unilateral primary osteoarthritis, left knee 04/25/2023   Diverticulitis of colon with bleeding 04/05/2021   Obesity (BMI 30-39.9) 04/05/2021   Hyperglycemia 04/05/2021   History of cataract extraction 09/29/2019   Hypercholesterolemia 05/07/2016   Benign essential hypertension 08/28/2015   History of vitamin D deficiency 08/28/2015   Chronic combined systolic and diastolic heart failure (HCC) 05/25/2015   Hypothyroidism 03/01/2015   Papilloma of right breast 11/22/2014  Dyspnea on exertion 09/16/2013   Complete heart block (HCC) 07/06/2012   Pacemaker 06/26/2012   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/26/2012   Upper respiratory infection with cough and congestion 06/26/2012   H/O cardiac catheterization, 2006, prior to valve surgery, with normal coronary arteries 06/26/2012   PAF (paroxysmal atrial fibrillation) (HCC) 04/13/2012   History of open heart surgery 01/29/2004   Past Medical History:  Diagnosis Date   Arthritis    Atrial fibrillation (HCC)    PAF---currently anticoagulated with Warfarin   Atrial flutter (HCC) 02/21/2004   s/p mitral valve repair and ppm insertion   Congenital heart block 08/26/2003   Diagnosed by Dr. Joy Nipple. A temp orary pacemaker  was placed in 1988 during hysterectomy procedure.   Diabetes mellitus without complication (HCC)    prediabetic   Diverticulosis    Dyslipidemia    takes Niacin and Crestor  daily   History of blood transfusion    no abnormal reaction noted   Hypothyroidism    takes Synthroid  daily   Mitral valve insufficiency    Recieved Mitral valve repair with reconstruction of the anterior leaflet cordis with cortex and ring annuloplasty   Nonischemic cardiomyopathy (HCC) 2006   EF of 35% due to AV dyssyncrony. Has since resolved per most recent ECHO.   RBBB    Due the VP with the LV epicardial lead that was inserted in 2006 during the mitral repair   Systemic hypertension    takes  Ramipril  daily-Maxzide  every other day   Urinary urgency     Family History  Problem Relation Age of Onset   Cancer - Other Mother    Pneumonia Father    Asthma Brother    Healthy Brother    Asthma Sister    Healthy Brother    Healthy Brother    Healthy Brother     Past Surgical History:  Procedure Laterality Date   ABDOMINAL HYSTERECTOMY     BREAST LUMPECTOMY WITH RADIOACTIVE SEED LOCALIZATION Right 11/22/2014   Procedure: RIGHT BREAST LUMPECTOMY WITH RADIOACTIVE SEED LOCALIZATION;  Surgeon: Boyce Byes, MD;  Location: [facility] OR;  Service: General;  Laterality: Right;   CARDIAC CATHETERIZATION  10/20/2003   Minimal decrease in the LV systolic EF of 40-45% with global hyperkinesis. Moderately dilated LV. 3+ Mitral regurgitation. Underfilling of LAD which promptly improved with intracoronary NTG administration. Microvascular angina or microvascular spasm. Otherwise coronaries were normal. No intervention necessary.   CARDIOVASCULAR STRESS TEST  09/14/2003   Performed for the pt's congenital CHB that was discovered during a preoperative evaluation for a colonoscopy.  EKG demonstrated CHB with junctional escape. Nonspecific ST changes noted. Stress ECG was positive for ischemia(2 mm ST depression). Dilated LV cavity.  Inability to calculate systolic function. High risk scan. This scan was followed up with a cardiac cath.   COLONOSCOPY     MITRAL VALVE ANNULOPLASTY  02/06/2004   Performed for severe MR requiring mitral valve repair with anterior leaflet reconstruction and a 26 [employer] annuloplasty ring--model: 4100 [id number], and an 80% lesion reduction (Dr. Nicanor Barge). Two LV leads were placed epicardially just in the event that biv pacing would be needed. (One of the leads had been used for during the ppm insertion recieved one week later)    PERMANENT PACEMAKER GENERATOR CHANGE  07/06/2012   [employer]   PERMANENT PACEMAKER GENERATOR CHANGE N/A 07/06/2012   Procedure: PERMANENT PACEMAKER GENERATOR CHANGE;  Surgeon: Luana Rumple, MD;  Location: [facility] CATH [facility];  Service: Cardiovascular;  Laterality: N/A;  PERMANENT PACEMAKER INSERTION  02/13/2004   PPM implant following a mitral valve annuloplasty. An epicardial active fixation lead (implanted on 02/06/2004 during the mitral valve procedure) was used for this implant.    US  ECHOCARDIOGRAPHY  09/10/2010 and1/07/2008   The last echo showed an EF of >55%; RV mildly dilated. Mild to moderately dilated LA  with borderline RA dilation as well. Impaired diastolic function. Normal RV systolic function.MVR without residual insufficiency.No Pericardial effusion.   Social History   Occupational History   Not on file  Tobacco Use   Smoking status: Former    Types: Cigarettes   Smokeless tobacco: Never   Tobacco comments:    quit smoking in 2006  Substance and Sexual Activity   Alcohol use: Yes    Comment: wine occasionally   Drug use: No   Sexual activity: Yes    Birth control/protection: Surgical

## 2023-06-25 NOTE — Progress Notes (Signed)
 Remote pacemaker transmission.

## 2023-06-26 NOTE — Addendum Note (Signed)
 Addended by: Edra Govern D on: 06/26/2023 11:55 AM   Modules accepted: Orders

## 2023-06-26 NOTE — Progress Notes (Signed)
 Remote pacemaker transmission.

## 2023-07-09 ENCOUNTER — Encounter: Payer: Self-pay | Admitting: Cardiovascular Disease

## 2023-07-15 ENCOUNTER — Ambulatory Visit: Admitting: Rehabilitative and Restorative Service Providers"

## 2023-07-15 ENCOUNTER — Encounter: Payer: Self-pay | Admitting: Rehabilitative and Restorative Service Providers"

## 2023-07-15 DIAGNOSIS — M6281 Muscle weakness (generalized): Secondary | ICD-10-CM

## 2023-07-15 DIAGNOSIS — M25662 Stiffness of left knee, not elsewhere classified: Secondary | ICD-10-CM

## 2023-07-15 DIAGNOSIS — M25562 Pain in left knee: Secondary | ICD-10-CM

## 2023-07-15 DIAGNOSIS — R262 Difficulty in walking, not elsewhere classified: Secondary | ICD-10-CM

## 2023-07-15 DIAGNOSIS — R600 Localized edema: Secondary | ICD-10-CM

## 2023-07-15 NOTE — Therapy (Signed)
 OUTPATIENT PHYSICAL THERAPY LOWER EXTREMITY EVALUATION  Referring diagnosis? M25.562,G89.29 (ICD-10-CM) - Chronic pain of left knee  Treatment diagnosis? (if different than referring diagnosis) See visit diagnosis: Localized edema R60.0; muscle weakness generalized M62.81; stiffness of the left knee not elsewhere classified M25.662; acute pain of left knee M25.562; difficulty in walking not elsewhere classified R26.2 What was this (referring dx) caused by? []  Surgery []  Fall []  Ongoing issue [x]  Arthritis []  Other: ____________  Laterality: []  Rt [x]  Lt []  Both  Check all possible CPT codes:  *CHOOSE 10 OR LESS*    See Planned Interventions listed in the Plan section of the Evaluation.    Patient Name: [name] MRN: [id number] DOB:Sep 25, 1945, 78 y.o., female Today's Date: 07/15/2023  END OF SESSION:  PT End of Session - 07/15/23 1229     Visit Number 1    Number of Visits 12    Date for PT Re-Evaluation 09/09/23    Authorization Type [employer]    Progress Note Due on Visit 10    PT Start Time 1015    PT Stop Time 1100    PT Time Calculation (min) 45 min    Activity Tolerance Patient tolerated treatment well;No increased pain;Patient limited by pain    Behavior During Therapy [facility] for tasks assessed/performed          Past Medical History:  Diagnosis Date   Arthritis    Atrial fibrillation (HCC)    PAF---currently anticoagulated with Warfarin   Atrial flutter (HCC) 02/21/2004   s/p mitral valve repair and ppm insertion   Congenital heart block 08/26/2003   Diagnosed by Dr. Joy Nipple. A temp orary pacemaker was placed in 1988 during hysterectomy procedure.   Diabetes mellitus without complication (HCC)    prediabetic   Diverticulosis    Dyslipidemia    takes Niacin and Crestor  daily   History of blood transfusion    no abnormal reaction noted   Hypothyroidism    takes Synthroid  daily   Mitral valve insufficiency    Recieved Mitral valve repair with  reconstruction of the anterior leaflet cordis with cortex and ring annuloplasty   Nonischemic cardiomyopathy (HCC) 2006   EF of 35% due to AV dyssyncrony. Has since resolved per most recent ECHO.   RBBB    Due the VP with the LV epicardial lead that was inserted in 2006 during the mitral repair   Systemic hypertension    takes  Ramipril  daily-Maxzide  every other day   Urinary urgency    Past Surgical History:  Procedure Laterality Date   ABDOMINAL HYSTERECTOMY     BREAST LUMPECTOMY WITH RADIOACTIVE SEED LOCALIZATION Right 11/22/2014   Procedure: RIGHT BREAST LUMPECTOMY WITH RADIOACTIVE SEED LOCALIZATION;  Surgeon: Boyce Byes, MD;  Location: [facility] OR;  Service: General;  Laterality: Right;   CARDIAC CATHETERIZATION  10/20/2003   Minimal decrease in the LV systolic EF of 40-45% with global hyperkinesis. Moderately dilated LV. 3+ Mitral regurgitation. Underfilling of LAD which promptly improved with intracoronary NTG administration. Microvascular angina or microvascular spasm. Otherwise coronaries were normal. No intervention necessary.   CARDIOVASCULAR STRESS TEST  09/14/2003   Performed for the pt's congenital CHB that was discovered during a preoperative evaluation for a colonoscopy.  EKG demonstrated CHB with junctional escape. Nonspecific ST changes noted. Stress ECG was positive for ischemia(2 mm ST depression). Dilated LV cavity. Inability to calculate systolic function. High risk scan. This scan was followed up with a cardiac cath.   COLONOSCOPY     MITRAL  VALVE ANNULOPLASTY  02/06/2004   Performed for severe MR requiring mitral valve repair with anterior leaflet reconstruction and a 26 [employer] annuloplasty ring--model: [id number], and an 80% lesion reduction (Dr. Nicanor Barge). Two LV leads were placed epicardially just in the event that biv pacing would be needed. (One of the leads had been used for during the ppm insertion recieved one week later)    PERMANENT PACEMAKER  GENERATOR CHANGE  07/06/2012   [employer]   PERMANENT PACEMAKER GENERATOR CHANGE N/A 07/06/2012   Procedure: PERMANENT PACEMAKER GENERATOR CHANGE;  Surgeon: Luana Rumple, MD;  Location: [facility] CATH LAB;  Service: Cardiovascular;  Laterality: N/A;   PERMANENT PACEMAKER INSERTION  02/13/2004   PPM implant following a mitral valve annuloplasty. An epicardial active fixation lead (implanted on 02/06/2004 during the mitral valve procedure) was used for this implant.    US  ECHOCARDIOGRAPHY  09/10/2010 and1/07/2008   The last echo showed an EF of >55%; RV mildly dilated. Mild to moderately dilated LA  with borderline RA dilation as well. Impaired diastolic function. Normal RV systolic function.MVR without residual insufficiency.No Pericardial effusion.   Patient Active Problem List   Diagnosis Date Noted   Unilateral primary osteoarthritis, left knee 04/25/2023   Diverticulitis of colon with bleeding 04/05/2021   Obesity (BMI 30-39.9) 04/05/2021   Hyperglycemia 04/05/2021   History of cataract extraction 09/29/2019   Hypercholesterolemia 05/07/2016   Benign essential hypertension 08/28/2015   History of vitamin D deficiency 08/28/2015   Chronic combined systolic and diastolic heart failure (HCC) 05/25/2015   Hypothyroidism 03/01/2015   Papilloma of right breast 11/22/2014   Dyspnea on exertion 09/16/2013   Complete heart block (HCC) 07/06/2012   Pacemaker 06/26/2012   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/26/2012   Upper respiratory infection with cough and congestion 06/26/2012   H/O cardiac catheterization, 2006, prior to valve surgery, with normal coronary arteries 06/26/2012   PAF (paroxysmal atrial fibrillation) (HCC) 04/13/2012   History of open heart surgery 01/29/2004    PCP: See chart  REFERRING PROVIDER: Norma Beckers Persons PA-C  REFERRING DIAG: [phone] (ICD-10-CM) - Chronic pain of left knee  THERAPY DIAG:  Localized edema - Plan: PT plan of care cert/re-cert  Muscle  weakness (generalized) - Plan: PT plan of care cert/re-cert  Stiffness of left knee, not elsewhere classified - Plan: PT plan of care cert/re-cert  Acute pain of left knee - Plan: PT plan of care cert/re-cert  Difficulty in walking, not elsewhere classified - Plan: PT plan of care cert/re-cert  Rationale for Evaluation and Treatment: Rehabilitation  ONSET DATE: 2-3 months  SUBJECTIVE:   SUBJECTIVE STATEMENT: Raylin had a cortisone shot in her left knee 04/24/2023 and got some relief.  The cortisone has worn off and she would like to get stronger to avoid additional left knee joint damage.  Standing after prolonged sitting is particularly rough.  PERTINENT HISTORY: Arthritis, A-fib, diabetes mellitus, hypothyroid, obesity and significant cardiac surgery PAIN:  Are you having pain? Yes: NPRS scale: 0-5/10 this week Pain location: Posterior, occasionally medial left knee Pain description: Ache Aggravating factors: Standing after prolonged sitting, bowling Relieving factors: Tylenol   PRECAUTIONS: None  RED FLAGS: None   WEIGHT BEARING RESTRICTIONS: No  FALLS:  Has patient fallen in last 6 months? No  LIVING ENVIRONMENT: Lives with: lives with their family Lives in: House/apartment Stairs: Theodore notes she has difficulty with stairs and has to go down sideways Has following equipment at home: None  OCCUPATION: Retired  PLOF: Independent  PATIENT  GOALS: Return to bowling, get back to activities without being limited by the left knee  NEXT MD VISIT: NA  OBJECTIVE:  Note: Objective measures were completed at Evaluation unless otherwise noted.  DIAGNOSTIC FINDINGS: Radiographs of the left knee demonstrate some medial joint space narrowing  early sclerotic changes no acute changes  PATIENT SURVEYS:  Patient-Specific Activity Scoring Scheme  0 represents "unable to perform." 10 represents "able to perform at prior level. 0 1 2 3 4 5 6 7 8 9  10 (Date and  Score)   Activity Eval     1.  Standing after prolonged sitting 5/10    2.  Walking 5/10    3.  Bowling 7/10   4.    5.    Score 5.67 average    Total score = sum of the activity scores/number of activities Minimum detectable change (90%CI) for average score = 2 points Minimum detectable change (90%CI) for single activity score = 3 points     COGNITION: Overall cognitive status: Within functional limits for tasks assessed     SENSATION: Clelia had no complaints of peripheral pain or paresthesias  EDEMA:  Noted and not objectively assessed  MUSCLE LENGTH: Hamstrings: Right 35 deg; Left 35 deg Quadriceps: Right 85 deg; Left 70 deg Gastrocnemius: Right 10 deg; Left 10 deg   LOWER EXTREMITY AROM:   Left/Right 07/15/2023   Hip flexion    Hip extension    Hip abduction    Hip adduction    Hip internal rotation    Hip external rotation    Knee flexion 120/125   Knee extension 0/0   Ankle dorsiflexion    Ankle plantarflexion    Ankle inversion    Ankle eversion     (Blank rows = not tested)  LOWER EXTREMITY STRENGTH:  In pounds assessed with hand-held dynamometer Left/Right 07/15/2023   Hip flexion    Hip extension    Hip abduction    Hip adduction    Hip internal rotation    Hip external rotation    Knee flexion    Knee extension 47.4/47.7   Ankle dorsiflexion    Ankle plantarflexion    Ankle inversion    Ankle eversion     (Blank rows = not tested)  GAIT: Distance walked: 100 feet Assistive device utilized: None Level of assistance: Complete Independence Comments: Brandon notes stiffness, particularly when standing after prolonged sitting                                                                                                                                TREATMENT DATE: 07/15/2023 Quadriceps sets with pillow under knees 2 sets of 10 for 5 seconds Supine hamstrings stretch with other leg straight 4 x 20 seconds each  [id number]: Reviewed  imaging; discussed knee anatomy and the importance of good quadriceps strength to avoid future knee joint damage; reviewed examination findings and her day 1 home exercise program  PATIENT EDUCATION:  Education details: See above Person educated: Patient Education method: Explanation, Demonstration, Tactile cues, Verbal cues, and Handouts Education comprehension: verbalized understanding, returned demonstration, verbal cues required, tactile cues required, and needs further education  HOME EXERCISE PROGRAM: Access Code: [email or web address] URL: [email or web address] Date: 07/15/2023 Prepared by: Terral Ferrari  Exercises - Supine Quadricep Sets  - 5 x daily - 7 x weekly - 2 sets - 10 reps - 5 second hold - Supine Hamstring Stretch  - 2 x daily - 7 x weekly - 1 sets - 5 reps - 20 seconds hold  ASSESSMENT:  CLINICAL IMPRESSION: Patient is a 78 y.o. female who was seen today for physical therapy evaluation and treatment for M25.562,G89.29 (ICD-10-CM) - Chronic pain of left knee.  Quadriceps weakness, quadriceps and hamstrings tightness and limited flexion active range of motion were noted and will be addressed with her supervised physical therapy.  Marylen is a very good rehabilitation candidate and should meet the below listed goals within the recommended plan of care.  OBJECTIVE IMPAIRMENTS: Abnormal gait, decreased activity tolerance, decreased endurance, decreased knowledge of condition, difficulty walking, decreased ROM, decreased strength, increased edema, impaired perceived functional ability, impaired flexibility, obesity, and pain.   ACTIVITY LIMITATIONS: bending, sitting, standing, squatting, stairs, and locomotion level  PARTICIPATION LIMITATIONS: community activity  PERSONAL FACTORS: Arthritis, A-fib, diabetes mellitus, hypothyroid, obesity and significant cardiac surgery are also affecting patient's functional outcome.   REHAB POTENTIAL: Good  CLINICAL DECISION MAKING:  Stable/uncomplicated  EVALUATION COMPLEXITY: Low   GOALS: Goals reviewed with patient? Yes  SHORT TERM GOALS: Target date: 08/12/2023 Alleen will be independent with her day 1 home exercise program Baseline: Started 07/15/2023 Goal status: INITIAL  2.  Improve bilateral knee flexion active range of motion to 130 degrees Baseline: 120/125 degrees Goal status: INITIAL  3.  Improve bilateral quadriceps strength to at least 55 pounds Baseline: ~47 pounds Goal status: INITIAL   LONG TERM GOALS: Target date: 09/09/2023  Improve patient specific functional scale to at least 8/10 Baseline: 5.67 Goal status: INITIAL  2.  Reneta will report left knee pain consistently 0-3/10 on the visual analog scale Baseline: 0-5/10 Goal status: INITIAL  3.  Improve bilateral quadriceps strength to at least 80 pounds Baseline: ~47 pounds Goal status: INITIAL  4.  Kyiah will be independent with her maintenance long-term home exercise program at discharge Baseline: Started 07/15/2023 Goal status: INITIAL  PLAN:  PT FREQUENCY: 1-2x/week  PT DURATION: 8 weeks  PLANNED INTERVENTIONS: 97110-Therapeutic exercises, 97530- Therapeutic activity, 97112- Neuromuscular re-education, 97535- Self Care, 16109- Manual therapy, [phone]- Gait training, [phone]- Vasopneumatic device, Patient/Family education, Balance training, Stair training, and Cryotherapy  PLAN FOR NEXT SESSION: Emphasis on quadriceps strengthening, secondarily quadriceps and hamstrings flexibility.   Joli Neas, PT, MPT 07/15/2023, 12:39 PM

## 2023-07-17 ENCOUNTER — Ambulatory Visit (INDEPENDENT_AMBULATORY_CARE_PROVIDER_SITE_OTHER)

## 2023-07-17 DIAGNOSIS — I442 Atrioventricular block, complete: Secondary | ICD-10-CM | POA: Diagnosis not present

## 2023-07-18 LAB — CUP PACEART REMOTE DEVICE CHECK
Battery Impedance: 3449 Ohm
Battery Remaining Longevity: 16 mo
Battery Voltage: 2.71 V
Brady Statistic AP VP Percent: 28 %
Brady Statistic AP VS Percent: 0 %
Brady Statistic AS VP Percent: 71 %
Brady Statistic AS VS Percent: 1 %
Date Time Interrogation Session: 20250619075501
Implantable Lead Connection Status: 753985
Implantable Lead Connection Status: 753985
Implantable Lead Implant Date: 20060109
Implantable Lead Implant Date: 20060116
Implantable Lead Location: 753858
Implantable Lead Location: 753859
Implantable Lead Model: 5071
Implantable Lead Model: 5076
Implantable Pulse Generator Implant Date: 20140609
Lead Channel Impedance Value: 436 Ohm
Lead Channel Impedance Value: 473 Ohm
Lead Channel Pacing Threshold Amplitude: 1.125 V
Lead Channel Pacing Threshold Amplitude: 1.25 V
Lead Channel Pacing Threshold Pulse Width: 0.4 ms
Lead Channel Pacing Threshold Pulse Width: 0.4 ms
Lead Channel Setting Pacing Amplitude: 2.25 V
Lead Channel Setting Pacing Amplitude: 2.5 V
Lead Channel Setting Pacing Pulse Width: 0.4 ms
Lead Channel Setting Sensing Sensitivity: 4 mV
Zone Setting Status: 755011
Zone Setting Status: 755011

## 2023-07-23 ENCOUNTER — Ambulatory Visit: Payer: Self-pay | Admitting: Physical Therapy

## 2023-07-23 ENCOUNTER — Encounter: Payer: Self-pay | Admitting: Physical Therapy

## 2023-07-23 DIAGNOSIS — R600 Localized edema: Secondary | ICD-10-CM

## 2023-07-23 DIAGNOSIS — M25562 Pain in left knee: Secondary | ICD-10-CM

## 2023-07-23 DIAGNOSIS — R262 Difficulty in walking, not elsewhere classified: Secondary | ICD-10-CM

## 2023-07-23 DIAGNOSIS — M6281 Muscle weakness (generalized): Secondary | ICD-10-CM | POA: Diagnosis not present

## 2023-07-23 DIAGNOSIS — M25662 Stiffness of left knee, not elsewhere classified: Secondary | ICD-10-CM

## 2023-07-23 NOTE — Therapy (Signed)
 OUTPATIENT PHYSICAL THERAPY LOWER EXTREMITY TREATMENT  Referring diagnosis? M25.562,G89.29 (ICD-10-CM) - Chronic pain of left knee  Treatment diagnosis? (if different than referring diagnosis) See visit diagnosis: Localized edema R60.0; muscle weakness generalized M62.81; stiffness of the left knee not elsewhere classified M25.662; acute pain of left knee M25.562; difficulty in walking not elsewhere classified R26.2 What was this (referring dx) caused by? []  Surgery []  Fall []  Ongoing issue [x]  Arthritis []  Other: ____________  Laterality: []  Rt [x]  Lt []  Both  Check all possible CPT codes:  *CHOOSE 10 OR LESS*    See Planned Interventions listed in the Plan section of the Evaluation.    Patient Name: [name] MRN: [id number] DOB:04/12/1945, 78 y.o., female Today's Date: 07/23/2023  END OF SESSION:  PT End of Session - 07/23/23 1429     Visit Number 2    Number of Visits 12    Date for PT Re-Evaluation 09/09/23    Authorization Type [employer]    Authorization Time Period COPAY $25 Approved Authorization #[id number]  Tracking #[id number]  07/15/2023-09/09/2023  10 Visits    Authorization - Visit Number 2    Authorization - Number of Visits 10    Progress Note Due on Visit 10    PT Start Time 1429    PT Stop Time 1510    PT Time Calculation (min) 41 min    Activity Tolerance Patient tolerated treatment well;No increased pain;Patient limited by pain    Behavior During Therapy [facility] for tasks assessed/performed           Past Medical History:  Diagnosis Date   Arthritis    Atrial fibrillation (HCC)    PAF---currently anticoagulated with Warfarin   Atrial flutter (HCC) 02/21/2004   s/p mitral valve repair and ppm insertion   Congenital heart block 08/26/2003   Diagnosed by Dr. Beverley Goldmann. A temp orary pacemaker was placed in 1988 during hysterectomy procedure.   Diabetes mellitus without complication (HCC)    prediabetic   Diverticulosis    Dyslipidemia     takes Niacin and Crestor  daily   History of blood transfusion    no abnormal reaction noted   Hypothyroidism    takes Synthroid  daily   Mitral valve insufficiency    Recieved Mitral valve repair with reconstruction of the anterior leaflet cordis with cortex and ring annuloplasty   Nonischemic cardiomyopathy (HCC) 2006   EF of 35% due to AV dyssyncrony. Has since resolved per most recent ECHO.   RBBB    Due the VP with the LV epicardial lead that was inserted in 2006 during the mitral repair   Systemic hypertension    takes  Ramipril  daily-Maxzide  every other day   Urinary urgency    Past Surgical History:  Procedure Laterality Date   ABDOMINAL HYSTERECTOMY     BREAST LUMPECTOMY WITH RADIOACTIVE SEED LOCALIZATION Right 11/22/2014   Procedure: RIGHT BREAST LUMPECTOMY WITH RADIOACTIVE SEED LOCALIZATION;  Surgeon: Elon Pacini, MD;  Location: [facility] OR;  Service: General;  Laterality: Right;   CARDIAC CATHETERIZATION  10/20/2003   Minimal decrease in the LV systolic EF of 40-45% with global hyperkinesis. Moderately dilated LV. 3+ Mitral regurgitation. Underfilling of LAD which promptly improved with intracoronary NTG administration. Microvascular angina or microvascular spasm. Otherwise coronaries were normal. No intervention necessary.   CARDIOVASCULAR STRESS TEST  09/14/2003   Performed for the pt's congenital CHB that was discovered during a preoperative evaluation for a colonoscopy.  EKG demonstrated CHB with junctional escape. Nonspecific ST changes noted.  Stress ECG was positive for ischemia(2 mm ST depression). Dilated LV cavity. Inability to calculate systolic function. High risk scan. This scan was followed up with a cardiac cath.   COLONOSCOPY     MITRAL VALVE ANNULOPLASTY  02/06/2004   Performed for severe MR requiring mitral valve repair with anterior leaflet reconstruction and a 26 [employer] annuloplasty ring--model: [id number], and an 80% lesion reduction (Dr.  Army). Two LV leads were placed epicardially just in the event that biv pacing would be needed. (One of the leads had been used for during the ppm insertion recieved one week later)    PERMANENT PACEMAKER GENERATOR CHANGE  07/06/2012   [employer]   PERMANENT PACEMAKER GENERATOR CHANGE N/A 07/06/2012   Procedure: PERMANENT PACEMAKER GENERATOR CHANGE;  Surgeon: Jerel Balding, MD;  Location: [facility] CATH LAB;  Service: Cardiovascular;  Laterality: N/A;   PERMANENT PACEMAKER INSERTION  02/13/2004   PPM implant following a mitral valve annuloplasty. An epicardial active fixation lead (implanted on 02/06/2004 during the mitral valve procedure) was used for this implant.    US  ECHOCARDIOGRAPHY  09/10/2010 and1/07/2008   The last echo showed an EF of >55%; RV mildly dilated. Mild to moderately dilated LA  with borderline RA dilation as well. Impaired diastolic function. Normal RV systolic function.MVR without residual insufficiency.No Pericardial effusion.   Patient Active Problem List   Diagnosis Date Noted   Unilateral primary osteoarthritis, left knee 04/25/2023   Diverticulitis of colon with bleeding 04/05/2021   Obesity (BMI 30-39.9) 04/05/2021   Hyperglycemia 04/05/2021   History of cataract extraction 09/29/2019   Hypercholesterolemia 05/07/2016   Benign essential hypertension 08/28/2015   History of vitamin D deficiency 08/28/2015   Chronic combined systolic and diastolic heart failure (HCC) 05/25/2015   Hypothyroidism 03/01/2015   Papilloma of right breast 11/22/2014   Dyspnea on exertion 09/16/2013   Complete heart block (HCC) 07/06/2012   Pacemaker 06/26/2012   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/26/2012   Upper respiratory infection with cough and congestion 06/26/2012   H/O cardiac catheterization, 2006, prior to valve surgery, with normal coronary arteries 06/26/2012   PAF (paroxysmal atrial fibrillation) (HCC) 04/13/2012   History of open heart surgery 01/29/2004    PCP: See  chart  REFERRING PROVIDER: Ronal Dragon Persons PA-C  REFERRING DIAG: [phone] (ICD-10-CM) - Chronic pain of left knee  THERAPY DIAG:  Localized edema  Muscle weakness (generalized)  Stiffness of left knee, not elsewhere classified  Acute pain of left knee  Difficulty in walking, not elsewhere classified  Rationale for Evaluation and Treatment: Rehabilitation  ONSET DATE: 2-3 months  SUBJECTIVE:   SUBJECTIVE STATEMENT: The exercises have been going good.  Her knees are not as painful with the exercises.    PERTINENT HISTORY: Arthritis, A-fib, diabetes mellitus, hypothyroid, obesity and significant cardiac surgery  PAIN:  Are you having pain?   Yes: NPRS scale:  this week at rest 0-2/10  standing up 3/10,  standing 10-20 minutes 4-5/10   Pain location: Posterior, occasionally medial left knee Pain description: Ache Aggravating factors: Standing after prolonged sitting, bowling Relieving factors: Tylenol   PRECAUTIONS: None  RED FLAGS: None   WEIGHT BEARING RESTRICTIONS: No  FALLS:  Has patient fallen in last 6 months? No  LIVING ENVIRONMENT: Lives with: lives with their family Lives in: House/apartment Stairs: Jalexis notes she has difficulty with stairs and has to go down sideways Has following equipment at home: None  OCCUPATION: Retired  PLOF: Independent  PATIENT GOALS: Return to bowling, get  back to activities without being limited by the left knee  NEXT MD VISIT: NA  OBJECTIVE:  Note: Objective measures were completed at Evaluation unless otherwise noted.  DIAGNOSTIC FINDINGS: Radiographs of the left knee demonstrate some medial joint space narrowing  early sclerotic changes no acute changes  PATIENT SURVEYS:  Patient-Specific Activity Scoring Scheme  0 represents "unable to perform." 10 represents "able to perform at prior level. 0 1 2 3 4 5 6 7 8 9  10 (Date and Score)   Activity Eval     1.  Standing after prolonged sitting 5/10     2.  Walking 5/10    3.  Bowling 7/10   4.    5.    Score 5.67 average    Total score = sum of the activity scores/number of activities Minimum detectable change (90%CI) for average score = 2 points Minimum detectable change (90%CI) for single activity score = 3 points  COGNITION: Overall cognitive status: Within functional limits for tasks assessed     SENSATION: Camella had no complaints of peripheral pain or paresthesias  EDEMA:  Noted and not objectively assessed  MUSCLE LENGTH: Hamstrings: Right 35 deg; Left 35 deg Quadriceps: Right 85 deg; Left 70 deg Gastrocnemius: Right 10 deg; Left 10 deg   LOWER EXTREMITY AROM:   Left/Right  07/15/23   Hip flexion    Hip extension    Hip abduction    Hip adduction    Hip internal rotation    Hip external rotation    Knee flexion 120/125   Knee extension 0/0   Ankle dorsiflexion    Ankle plantarflexion    Ankle inversion    Ankle eversion     (Blank rows = not tested)  LOWER EXTREMITY STRENGTH:  In pounds assessed with hand-held dynamometer Left/Right 07/15/2023  Hip flexion   Hip extension   Hip abduction   Hip adduction   Hip internal rotation   Hip external rotation   Knee flexion   Knee extension 47.4/47.7  Ankle dorsiflexion   Ankle plantarflexion   Ankle inversion   Ankle eversion    (Blank rows = not tested)  GAIT: Distance walked: 100 feet Assistive device utilized: None Level of assistance: Complete Independence Comments: Brandon notes stiffness, particularly when standing after prolonged sitting   TODAY'S TREATMENT            DATE:   07/23/2023 Therapeutic Exercise: PT instructed in seated version of quad set & hamstring stretch as alternate to supine initially taught.  HO with demo & verbal cues.  Pt verbalized understanding.   Nustep seat 7 level 6 with BLEs & BUEs.  PT educated on use at [facility] recommending 10 min 2-3 sets with timed 5 min rest;  decrease rest as able.  Pt verbalized  understanding.   Therapeutic Activities: PT demo & verbal cues on sit to / from stand techique.  Pt performed 10 reps from 18 chair with armrests and 10 reps 18 chair without armrests.  Pt verbalized understanding.  TREATMENT DATE: 07/15/2023 Quadriceps sets with pillow under knees 2 sets of 10 for 5 seconds Supine hamstrings stretch with other leg straight 4 x 20 seconds each  [id number]: Reviewed imaging; discussed knee anatomy and the importance of good quadriceps strength to avoid future knee joint damage; reviewed examination findings and her day 1 home exercise program   PATIENT EDUCATION:  Education details: See above Person educated: Patient Education method: Explanation, Demonstration, Tactile cues, Verbal cues, and Handouts Education comprehension: verbalized understanding, returned demonstration, verbal cues required, tactile cues required, and needs further education  HOME EXERCISE PROGRAM: Access Code: [email or web address] URL: [email or web address] Date: 07/23/2023 Prepared by: Grayce Spatz  Exercises - Supine Quadricep Sets  - 5 x daily - 7 x weekly - 2 sets - 10 reps - 5 second hold - Supine Hamstring Stretch  - 2 x daily - 7 x weekly - 1 sets - 5 reps - 20 seconds hold - Seated Hamstring Stretch with Strap  - 2 x daily - 7 x weekly - 1 sets - 5 reps - 20 seconds hold - Seated Quad Set  - 5 x daily - 7 x weekly - 2 sets - 10 reps - 5 seconds hold  ASSESSMENT:  CLINICAL IMPRESSION: PT updated HEP to give alternative quad set and hamstring stretch in seated position.  PT worked on sit to/from Scientist, clinical (histocompatibility and immunogenetics) which she improved with instruction.  This should carryover to being able to lower her body weight to roll bowling ball.  PT also introduced NuStep that she can do at the [facility] to work on muscle endurance.  She appears to understand how to  utilize this piece of equipment.  OBJECTIVE IMPAIRMENTS: Abnormal gait, decreased activity tolerance, decreased endurance, decreased knowledge of condition, difficulty walking, decreased ROM, decreased strength, increased edema, impaired perceived functional ability, impaired flexibility, obesity, and pain.   ACTIVITY LIMITATIONS: bending, sitting, standing, squatting, stairs, and locomotion level  PARTICIPATION LIMITATIONS: community activity  PERSONAL FACTORS: Arthritis, A-fib, diabetes mellitus, hypothyroid, obesity and significant cardiac surgery are also affecting patient's functional outcome.   REHAB POTENTIAL: Good  CLINICAL DECISION MAKING: Stable/uncomplicated  EVALUATION COMPLEXITY: Low   GOALS: Goals reviewed with patient? Yes  SHORT TERM GOALS: Target date: 08/12/2023 Joniece will be independent with her day 1 home exercise program Baseline: Started 07/15/2023 Goal status: Ongoing   07/23/2023  2.  Improve bilateral knee flexion active range of motion to 130 degrees Baseline: 120/125 degrees Goal status: Ongoing   07/23/2023  3.  Improve bilateral quadriceps strength to at least 55 pounds Baseline: ~47 pounds Goal status: Ongoing   07/23/2023   LONG TERM GOALS: Target date: 09/09/2023  Improve patient specific functional scale to at least 8/10 Baseline: 5.67 Goal status: Ongoing   07/23/2023  2.  Leyli will report left knee pain consistently 0-3/10 on the visual analog scale Baseline: 0-5/10 Goal status: Ongoing   07/23/2023  3.  Improve bilateral quadriceps strength to at least 80 pounds Baseline: ~47 pounds Goal status: Ongoing   07/23/2023  4.  Kaveri will be independent with her maintenance long-term home exercise program at discharge Baseline: Started 07/15/2023 Goal status: Ongoing   07/23/2023  PLAN:  PT FREQUENCY: 1-2x/week  PT DURATION: 8 weeks  PLANNED INTERVENTIONS: 97110-Therapeutic exercises, 97530- Therapeutic activity, 97112- Neuromuscular  re-education, 97535- Self Care, 02859- Manual therapy, [phone]- Gait training, [phone]- Vasopneumatic device, Patient/Family education, Balance training, Stair training, and Cryotherapy  PLAN FOR NEXT SESSION: Progress HEP for functional watch strength, add gastroc stretch to HEP.  emphasis on quadriceps strengthening, secondarily quadriceps and hamstrings flexibility.   Eino Whitner, PT, DPT 07/23/2023, 4:15 PM

## 2023-07-24 ENCOUNTER — Ambulatory Visit: Payer: Self-pay | Admitting: Cardiovascular Disease

## 2023-07-30 ENCOUNTER — Encounter: Admitting: Physical Therapy

## 2023-07-30 NOTE — Progress Notes (Signed)
 Remote pacemaker transmission.

## 2023-07-30 NOTE — Addendum Note (Signed)
 Addended by: TAWNI DRILLING D on: 07/30/2023 01:51 PM   Modules accepted: Orders, Level of Service

## 2023-07-31 ENCOUNTER — Encounter: Admitting: Physical Therapy

## 2023-08-04 ENCOUNTER — Encounter: Payer: Self-pay | Admitting: Physical Therapy

## 2023-08-04 ENCOUNTER — Ambulatory Visit: Admitting: Physical Therapy

## 2023-08-04 DIAGNOSIS — M25562 Pain in left knee: Secondary | ICD-10-CM

## 2023-08-04 DIAGNOSIS — R600 Localized edema: Secondary | ICD-10-CM | POA: Diagnosis not present

## 2023-08-04 DIAGNOSIS — M6281 Muscle weakness (generalized): Secondary | ICD-10-CM

## 2023-08-04 DIAGNOSIS — M25662 Stiffness of left knee, not elsewhere classified: Secondary | ICD-10-CM | POA: Diagnosis not present

## 2023-08-04 DIAGNOSIS — R262 Difficulty in walking, not elsewhere classified: Secondary | ICD-10-CM

## 2023-08-04 NOTE — Therapy (Signed)
 OUTPATIENT PHYSICAL THERAPY LOWER EXTREMITY TREATMENT DISCHARGE SUMMARY   Patient Name: [name] MRN: [id number] DOB:1945/11/17, 78 y.o., female Today's Date: 08/04/2023  END OF SESSION:  PT End of Session - 08/04/23 1015     Visit Number 3    Number of Visits 12    Date for PT Re-Evaluation 09/09/23    Authorization Type [employer]    Authorization Time Period COPAY $25 Approved Authorization #[id number]  Tracking #[id number]  07/15/2023-09/09/2023  10 Visits    Authorization - Number of Visits 10    Progress Note Due on Visit 10    PT Start Time 1012    PT Stop Time 1052    PT Time Calculation (min) 40 min    Activity Tolerance Patient tolerated treatment well;No increased pain;Patient limited by pain    Behavior During Therapy [facility] for tasks assessed/performed            Past Medical History:  Diagnosis Date   Arthritis    Atrial fibrillation (HCC)    PAF---currently anticoagulated with Warfarin   Atrial flutter (HCC) 02/21/2004   s/p mitral valve repair and ppm insertion   Congenital heart block 08/26/2003   Diagnosed by Dr. Beverley Goldmann. A temp orary pacemaker was placed in 1988 during hysterectomy procedure.   Diabetes mellitus without complication (HCC)    prediabetic   Diverticulosis    Dyslipidemia    takes Niacin and Crestor  daily   History of blood transfusion    no abnormal reaction noted   Hypothyroidism    takes Synthroid  daily   Mitral valve insufficiency    Recieved Mitral valve repair with reconstruction of the anterior leaflet cordis with cortex and ring annuloplasty   Nonischemic cardiomyopathy (HCC) 2006   EF of 35% due to AV dyssyncrony. Has since resolved per most recent ECHO.   RBBB    Due the VP with the LV epicardial lead that was inserted in 2006 during the mitral repair   Systemic hypertension    takes  Ramipril  daily-Maxzide  every other day   Urinary urgency    Past Surgical History:  Procedure Laterality Date   ABDOMINAL  HYSTERECTOMY     BREAST LUMPECTOMY WITH RADIOACTIVE SEED LOCALIZATION Right 11/22/2014   Procedure: RIGHT BREAST LUMPECTOMY WITH RADIOACTIVE SEED LOCALIZATION;  Surgeon: Elon Pacini, MD;  Location: [facility] OR;  Service: General;  Laterality: Right;   CARDIAC CATHETERIZATION  10/20/2003   Minimal decrease in the LV systolic EF of 40-45% with global hyperkinesis. Moderately dilated LV. 3+ Mitral regurgitation. Underfilling of LAD which promptly improved with intracoronary NTG administration. Microvascular angina or microvascular spasm. Otherwise coronaries were normal. No intervention necessary.   CARDIOVASCULAR STRESS TEST  09/14/2003   Performed for the pt's congenital CHB that was discovered during a preoperative evaluation for a colonoscopy.  EKG demonstrated CHB with junctional escape. Nonspecific ST changes noted. Stress ECG was positive for ischemia(2 mm ST depression). Dilated LV cavity. Inability to calculate systolic function. High risk scan. This scan was followed up with a cardiac cath.   COLONOSCOPY     MITRAL VALVE ANNULOPLASTY  02/06/2004   Performed for severe MR requiring mitral valve repair with anterior leaflet reconstruction and a 26 [employer] annuloplasty ring--model: [id number], and an 80% lesion reduction (Dr. Army). Two LV leads were placed epicardially just in the event that biv pacing would be needed. (One of the leads had been used for during the ppm insertion recieved one week later)    PERMANENT PACEMAKER  GENERATOR CHANGE  07/06/2012   Medtronic   PERMANENT PACEMAKER GENERATOR CHANGE N/A 07/06/2012   Procedure: PERMANENT PACEMAKER GENERATOR CHANGE;  Surgeon: Jerel Balding, MD;  Location: [facility] CATH LAB;  Service: Cardiovascular;  Laterality: N/A;   PERMANENT PACEMAKER INSERTION  02/13/2004   PPM implant following a mitral valve annuloplasty. An epicardial active fixation lead (implanted on 02/06/2004 during the mitral valve procedure) was used for this implant.    US   ECHOCARDIOGRAPHY  09/10/2010 and1/07/2008   The last echo showed an EF of >55%; RV mildly dilated. Mild to moderately dilated LA  with borderline RA dilation as well. Impaired diastolic function. Normal RV systolic function.MVR without residual insufficiency.No Pericardial effusion.   Patient Active Problem List   Diagnosis Date Noted   Unilateral primary osteoarthritis, left knee 04/25/2023   Diverticulitis of colon with bleeding 04/05/2021   Obesity (BMI 30-39.9) 04/05/2021   Hyperglycemia 04/05/2021   History of cataract extraction 09/29/2019   Hypercholesterolemia 05/07/2016   Benign essential hypertension 08/28/2015   History of vitamin D deficiency 08/28/2015   Chronic combined systolic and diastolic heart failure (HCC) 05/25/2015   Hypothyroidism 03/01/2015   Papilloma of right breast 11/22/2014   Dyspnea on exertion 09/16/2013   Complete heart block (HCC) 07/06/2012   Pacemaker 06/26/2012   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/26/2012   Upper respiratory infection with cough and congestion 06/26/2012   H/O cardiac catheterization, 2006, prior to valve surgery, with normal coronary arteries 06/26/2012   PAF (paroxysmal atrial fibrillation) (HCC) 04/13/2012   History of open heart surgery 01/29/2004    PCP: See chart  REFERRING PROVIDER: Ronal Dragon Persons PA-C  REFERRING DIAG: [phone] (ICD-10-CM) - Chronic pain of left knee  THERAPY DIAG:  Localized edema  Muscle weakness (generalized)  Stiffness of left knee, not elsewhere classified  Acute pain of left knee  Difficulty in walking, not elsewhere classified  Rationale for Evaluation and Treatment: Rehabilitation  ONSET DATE: 2-3 months  SUBJECTIVE:   SUBJECTIVE STATEMENT: Knee is doing really well; hasn't had much pain for the past couple of weeks  PERTINENT HISTORY: Arthritis, A-fib, diabetes mellitus, hypothyroid, obesity and significant cardiac surgery  PAIN:  Are you having pain?   Yes: NPRS  scale:  this week at rest 0/10  standing up 0/10,  standing 10-20 minutes 1-2/10   Pain location: Posterior, occasionally medial left knee Pain description: Ache Aggravating factors: Standing after prolonged sitting, bowling Relieving factors: Tylenol   PRECAUTIONS: None  RED FLAGS: None   WEIGHT BEARING RESTRICTIONS: No  FALLS:  Has patient fallen in last 6 months? No  LIVING ENVIRONMENT: Lives with: lives with their family Lives in: House/apartment Stairs: Lea notes she has difficulty with stairs and has to go down sideways Has following equipment at home: None  OCCUPATION: Retired  PLOF: Independent  PATIENT GOALS: Return to bowling, get back to activities without being limited by the left knee  NEXT MD VISIT: NA  OBJECTIVE:  Note: Objective measures were completed at Evaluation unless otherwise noted.  DIAGNOSTIC FINDINGS: Radiographs of the left knee demonstrate some medial joint space narrowing  early sclerotic changes no acute changes  PATIENT SURVEYS:  Patient-Specific Activity Scoring Scheme  0 represents "unable to perform." 10 represents "able to perform at prior level. 0 1 2 3 4 5 6 7 8 9  10 (Date and Score)   Activity Eval  08/04/23   1.  Standing after prolonged sitting 5/10 8   2.  Walking 5/10 8  3.  Bowling  7/10 8  Score 5.67 average 8   Total score = sum of the activity scores/number of activities Minimum detectable change (90%CI) for average score = 2 points Minimum detectable change (90%CI) for single activity score = 3 points  COGNITION: Overall cognitive status: Within functional limits for tasks assessed     SENSATION: Aveyah had no complaints of peripheral pain or paresthesias  EDEMA:  Noted and not objectively assessed  MUSCLE LENGTH: Hamstrings: Right 35 deg; Left 35 deg Quadriceps: Right 85 deg; Left 70 deg Gastrocnemius: Right 10 deg; Left 10 deg   LOWER EXTREMITY AROM:   Left/Right  07/15/23 Left/Right 08/04/23   Hip flexion    Hip extension    Hip abduction    Hip adduction    Hip internal rotation    Hip external rotation    Knee flexion 120/125 128/130  Knee extension 0/0   Ankle dorsiflexion    Ankle plantarflexion    Ankle inversion    Ankle eversion     (Blank rows = not tested)  LOWER EXTREMITY STRENGTH:  In pounds assessed with hand-held dynamometer Left/Right 07/15/2023 Left/Right 08/04/23  Knee extension 47.4/47.7 70.5/75.55   (Blank rows = not tested)  GAIT: Distance walked: 100 feet Assistive device utilized: None Level of assistance: Complete Independence Comments: Brandon notes stiffness, particularly when standing after prolonged sitting   TODAY'S TREATMENT 08/04/23 TherEx NuStep L6 x 8 min; UEs/LEs Knee extension machine 10# x10 bil; educated in single limb option as well Hamstring curl machine 15# x 10 bil; educated on single limb option as well Discussed continuation of current HEP as well as gym program provided today for pt.  TherAct Leg Press 100# 2x10 bil; then single limb 50# 2x10 performed bil   07/23/2023 Therapeutic Exercise: PT instructed in seated version of quad set & hamstring stretch as alternate to supine initially taught.  HO with demo & verbal cues.  Pt verbalized understanding.   Nustep seat 7 level 6 with BLEs & BUEs.  PT educated on use at [facility] recommending 10 min 2-3 sets with timed 5 min rest;  decrease rest as able.  Pt verbalized understanding.   Therapeutic Activities: PT demo & verbal cues on sit to / from stand techique.  Pt performed 10 reps from 18 chair with armrests and 10 reps 18 chair without armrests.  Pt verbalized understanding.                                                                                                                               07/15/2023 Quadriceps sets with pillow under knees 2 sets of 10 for 5 seconds Supine hamstrings stretch with other leg straight 4 x 20 seconds each  97535: Reviewed imaging;  discussed knee anatomy and the importance of good quadriceps strength to avoid future knee joint damage; reviewed examination findings and her day 1 home exercise program   PATIENT EDUCATION:  Education details: See above Person educated: Patient  Education method: Explanation, Demonstration, Tactile cues, Verbal cues, and Handouts Education comprehension: verbalized understanding, returned demonstration, verbal cues required, tactile cues required, and needs further education  HOME EXERCISE PROGRAM: Access Code: [email or web address] URL: [email or web address] Date: 08/04/2023 Prepared by: Corean Ku  Exercises - Supine Quadricep Sets  - 5 x daily - 7 x weekly - 2 sets - 10 reps - 5 second hold - Supine Hamstring Stretch  - 2 x daily - 7 x weekly - 1 sets - 5 reps - 20 seconds hold - Seated Hamstring Stretch with Strap  - 2 x daily - 7 x weekly - 1 sets - 5 reps - 20 seconds hold - Seated Quad Set  - 5 x daily - 7 x weekly - 2 sets - 10 reps - 5 seconds hold - Single Leg Press  - 1 x daily - 7 x weekly - 2-3 sets - 10 reps - Single Leg Knee Extension with Weight Machine  - 1 x daily - 7 x weekly - 2-3 sets - 10 reps - Single Leg Hamstring Curl with Weight Machine  - 1 x daily - 7 x weekly - 2-3 sets - 10 reps - Hip Adduction Machine  - 1 x daily - 7 x weekly - 2-3 sets - 10 reps - Hip Abduction Machine  - 1 x daily - 7 x weekly - 2-3 sets - 10 reps  ASSESSMENT:  CLINICAL IMPRESSION: Pt has met/nearly met all LTGs at this time and she is pleased with her progress.  Will d/c PT today at pt's request.   OBJECTIVE IMPAIRMENTS: Abnormal gait, decreased activity tolerance, decreased endurance, decreased knowledge of condition, difficulty walking, decreased ROM, decreased strength, increased edema, impaired perceived functional ability, impaired flexibility, obesity, and pain.   ACTIVITY LIMITATIONS: bending, sitting, standing, squatting, stairs, and locomotion  level  PARTICIPATION LIMITATIONS: community activity  PERSONAL FACTORS: Arthritis, A-fib, diabetes mellitus, hypothyroid, obesity and significant cardiac surgery are also affecting patient's functional outcome.   REHAB POTENTIAL: Good  CLINICAL DECISION MAKING: Stable/uncomplicated  EVALUATION COMPLEXITY: Low   GOALS: Goals reviewed with patient? Yes  SHORT TERM GOALS: Target date: 08/12/2023 Majesti will be independent with her day 1 home exercise program Baseline: Started 07/15/2023 Goal status: MET 08/04/23  2.  Improve bilateral knee flexion active range of motion to 130 degrees Baseline: 120/125 degrees Goal status: PARTIALLY MET 08/04/23  3.  Improve bilateral quadriceps strength to at least 55 pounds Baseline: ~47 pounds Goal status: MET MET 08/04/23   LONG TERM GOALS: Target date: 09/09/2023  Improve patient specific functional scale to at least 8/10 Baseline: 5.67 Goal status: MET 08/04/23  2.  Timi will report left knee pain consistently 0-3/10 on the visual analog scale Baseline: 0-5/10 Goal status: MET 08/04/23  3.  Improve bilateral quadriceps strength to at least 80 pounds Baseline: ~47 pounds Goal status: NOT MET (IMPROVED, NOT QUITE TO GOAL) 08/04/23  4.  Lilliam will be independent with her maintenance long-term home exercise program at discharge Baseline: Started 07/15/2023 Goal status: MET 08/04/23  PLAN:  PT FREQUENCY: 1-2x/week  PT DURATION: 8 weeks  PLANNED INTERVENTIONS: 97110-Therapeutic exercises, 97530- Therapeutic activity, 97112- Neuromuscular re-education, 97535- Self Care, 02859- Manual therapy, [phone]- Gait training, [phone]- Vasopneumatic device, Patient/Family education, Balance training, Stair training, and Cryotherapy  PLAN FOR NEXT SESSION: D/C PT today.   Corean JULIANNA Ku, PT, DPT 08/04/2023, 11:07 AM    PHYSICAL THERAPY DISCHARGE SUMMARY  Visits from Start of Care: 3  Current functional level related  to goals / functional  outcomes: See above   Remaining deficits: See above   Education / Equipment: HEP, community fitness   Patient agrees to discharge. Patient goals were partially met. Patient is being discharged due to being pleased with the current functional level.   Corean JULIANNA Ku, PT, DPT 08/04/23 11:07 AM  [facility] [address] [place], [place], [address] Phone: [phone]   Fax:  [phone]

## 2023-08-07 ENCOUNTER — Encounter: Payer: Self-pay | Admitting: Pulmonary Disease

## 2023-08-07 ENCOUNTER — Ambulatory Visit: Attending: Pulmonary Disease | Admitting: Pulmonary Disease

## 2023-08-07 ENCOUNTER — Encounter: Admitting: Rehabilitative and Restorative Service Providers"

## 2023-08-07 VITALS — BP 109/58 | HR 96 | Ht 63.0 in | Wt 200.7 lb

## 2023-08-07 DIAGNOSIS — Z950 Presence of cardiac pacemaker: Secondary | ICD-10-CM

## 2023-08-07 DIAGNOSIS — I4719 Other supraventricular tachycardia: Secondary | ICD-10-CM

## 2023-08-07 DIAGNOSIS — Z9889 Other specified postprocedural states: Secondary | ICD-10-CM

## 2023-08-07 DIAGNOSIS — I10 Essential (primary) hypertension: Secondary | ICD-10-CM

## 2023-08-07 DIAGNOSIS — Z5181 Encounter for therapeutic drug level monitoring: Secondary | ICD-10-CM | POA: Diagnosis not present

## 2023-08-07 DIAGNOSIS — Z79899 Other long term (current) drug therapy: Secondary | ICD-10-CM

## 2023-08-07 DIAGNOSIS — I442 Atrioventricular block, complete: Secondary | ICD-10-CM

## 2023-08-07 DIAGNOSIS — I5042 Chronic combined systolic (congestive) and diastolic (congestive) heart failure: Secondary | ICD-10-CM

## 2023-08-07 LAB — CUP PACEART INCLINIC DEVICE CHECK
Battery Impedance: 3620 Ohm
Battery Remaining Longevity: 16 mo
Battery Voltage: 2.7 V
Brady Statistic AP VP Percent: 28 %
Brady Statistic AP VS Percent: 0 %
Brady Statistic AS VP Percent: 71 %
Brady Statistic AS VS Percent: 1 %
Date Time Interrogation Session: 20250710121806
Implantable Lead Connection Status: 753985
Implantable Lead Connection Status: 753985
Implantable Lead Implant Date: 20060109
Implantable Lead Implant Date: 20060116
Implantable Lead Location: 753858
Implantable Lead Location: 753859
Implantable Lead Model: 5071
Implantable Lead Model: 5076
Implantable Pulse Generator Implant Date: 20140609
Lead Channel Impedance Value: 471 Ohm
Lead Channel Impedance Value: 497 Ohm
Lead Channel Pacing Threshold Amplitude: 0.75 V
Lead Channel Pacing Threshold Amplitude: 0.75 V
Lead Channel Pacing Threshold Amplitude: 1 V
Lead Channel Pacing Threshold Amplitude: 1.125 V
Lead Channel Pacing Threshold Pulse Width: 0.4 ms
Lead Channel Pacing Threshold Pulse Width: 0.4 ms
Lead Channel Pacing Threshold Pulse Width: 0.4 ms
Lead Channel Pacing Threshold Pulse Width: 0.4 ms
Lead Channel Sensing Intrinsic Amplitude: 4 mV
Lead Channel Setting Pacing Amplitude: 2 V
Lead Channel Setting Pacing Amplitude: 2.5 V
Lead Channel Setting Pacing Pulse Width: 0.4 ms
Lead Channel Setting Sensing Sensitivity: 4 mV
Zone Setting Status: 755011
Zone Setting Status: 755011

## 2023-08-07 NOTE — Patient Instructions (Signed)
 Medication Instructions:  Your physician recommends that you continue on your current medications as directed. Please refer to the Current Medication list given to you today.  *If you need a refill on your cardiac medications before your next appointment, please call your pharmacy*  Lab Work: None ordered If you have labs (blood work) drawn today and your tests are completely normal, you will receive your results only by: MyChart Message (if you have MyChart) OR A paper copy in the mail If you have any lab test that is abnormal or we need to change your treatment, we will call you to review the results.  Follow-Up: At [facility], you and your health needs are our priority.  As part of our continuing mission to provide you with exceptional heart care, our providers are all part of one team.  This team includes your primary Cardiologist (physician) and Advanced Practice Providers or APPs (Physician Assistants and Nurse Practitioners) who all work together to provide you with the care you need, when you need it.  Your next appointment:   6 month(s)  Provider:   You will see one of the following Advanced Practice Providers on your designated Care Team:   Mertha Abrahams, Kennard Pea [address]" [place], PA-C Suzann Riddle, NP Creighton Doffing, NP

## 2023-08-07 NOTE — Progress Notes (Signed)
 Electrophysiology Office Note:   Date:  08/07/2023  ID:  OUITA NISH, DOB July 04, 1945, MRN [id number]  Primary Cardiologist: Jerel Balding, MD Primary Heart Failure: None Electrophysiologist: None       History of Present Illness:   [name] is a 78 y.o. female with h/o SND / CHB s/p PPM, AF, MV insufficiency s/p annuloplasty repair, CAD, HTN, HLD, DM II seen today for routine electrophysiology followup.   Since last being seen in our clinic the patient reports doing very well with no device related concerns. Notes occasional mild swelling in her feet.    She denies chest pain, palpitations, dyspnea, PND, orthopnea, nausea, vomiting, dizziness, syncope, edema, weight gain, or early satiety.   Review of systems complete and found to be negative unless listed in HPI.    EP Information / Studies Reviewed:    EKG is ordered today. Personal review as below.  EKG Interpretation Date/Time:  Thursday August 07 2023 10:36:13 EDT Ventricular Rate:  96 PR Interval:    QRS Duration:  188 QT Interval:  460 QTC Calculation: 581 R Axis:   -36  Text Interpretation: Ventricular-paced rhythm Confirmed by Aniceto Jarvis ([id number]) on 08/07/2023 10:56:13 AM   PPM Interrogation-  reviewed in detail today,  See PACEART report.  Device History: Medtronic Dual Chamber PPM implanted 2006 for SND / CHB. Ventricular lead is an active fixation epicardial lead on the surface of the LV Generator Change > 2014   Risk Assessment/Calculations:    CHA2DS2-VASc Score = 5   This indicates a 7.2% annual risk of stroke. The patient's score is based upon: CHF History: 1 HTN History: 1 Diabetes History: 0 Stroke History: 0 Vascular Disease History: 0 Age Score: 2 Gender Score: 1             Physical Exam:   VS:  BP (!) 109/58   Pulse 96   Ht 5' 3 (1.6 m)   Wt 200 lb 11.2 oz (91 kg)   SpO2 99%   BMI 35.55 kg/m    Wt Readings from Last 3 Encounters:  08/07/23 200 lb 11.2 oz (91 kg)   01/16/23 204 lb 12.8 oz (92.9 kg)  07/18/22 204 lb 12.8 oz (92.9 kg)     GEN: Well nourished, well developed in no acute distress NECK: No JVD; No carotid bruits CARDIAC: largely regular rate and rhythm (VP), no murmurs, rubs, gallops RESPIRATORY:  Clear to auscultation without rales, wheezing or rhonchi  ABDOMEN: Soft, non-tender, non-distended EXTREMITIES:  No edema; No deformity   ASSESSMENT AND PLAN:    SND / CHB s/p Medtronic PPM  Epicardial lead  -Normal PPM function -See Pace Art report > unable to test atrial threshold due to rhythm / rate, prior auto threshold was 1V at 0.64ms, no R waves at 40 bpm -No changes today  Paroxysmal Atrial Fibrillation  High Risk Drug Monitoring: Sotalol   PAT CHA2DS2-VASc 5, hx of LAA clipping -3.7% burden on device  -OAC for stroke prophylaxis  -EKG VP, QTc 423 ms in V5 / manually corrected for QRS > will review with Dr. Balding -continue sotalol  80 mg BID  -update labs > BMP, Mg+ at next visit  Secondary Hypercoagulable State  -continue Eliquis , dose reviewed and appropriate by age / wt   Hypertension  -well controlled on current regimen    HFmrEF  G1DD VHD: s/p MV Annuloplasty  LVEF 45-50% 01/2021  -euvolemic on exam  -per Cardiology   Disposition:   Follow up with Dr.  Croitoru or EP APP in 6 months  Signed, Daphne Barrack, NP-C, AGACNP-BC [facility] - Electrophysiology  08/07/2023, 12:41 PM

## 2023-08-11 ENCOUNTER — Ambulatory Visit: Payer: Self-pay | Admitting: Cardiovascular Disease

## 2023-08-11 ENCOUNTER — Encounter

## 2023-08-18 ENCOUNTER — Ambulatory Visit

## 2023-08-18 ENCOUNTER — Encounter: Admitting: Physical Therapy

## 2023-08-18 DIAGNOSIS — I442 Atrioventricular block, complete: Secondary | ICD-10-CM | POA: Diagnosis not present

## 2023-08-19 LAB — CUP PACEART REMOTE DEVICE CHECK
Battery Impedance: 3660 Ohm
Battery Remaining Longevity: 15 mo
Battery Voltage: 2.71 V
Brady Statistic AP VP Percent: 36 %
Brady Statistic AP VS Percent: 0 %
Brady Statistic AS VP Percent: 63 %
Brady Statistic AS VS Percent: 1 %
Date Time Interrogation Session: 20250721102603
Implantable Lead Connection Status: 753985
Implantable Lead Connection Status: 753985
Implantable Lead Implant Date: 20060109
Implantable Lead Implant Date: 20060116
Implantable Lead Location: 753858
Implantable Lead Location: 753859
Implantable Lead Model: 5071
Implantable Lead Model: 5076
Implantable Pulse Generator Implant Date: 20140609
Lead Channel Impedance Value: 434 Ohm
Lead Channel Impedance Value: 480 Ohm
Lead Channel Pacing Threshold Amplitude: 1 V
Lead Channel Pacing Threshold Amplitude: 1 V
Lead Channel Pacing Threshold Pulse Width: 0.4 ms
Lead Channel Pacing Threshold Pulse Width: 0.4 ms
Lead Channel Setting Pacing Amplitude: 2 V
Lead Channel Setting Pacing Amplitude: 2.5 V
Lead Channel Setting Pacing Pulse Width: 0.4 ms
Lead Channel Setting Sensing Sensitivity: 4 mV
Zone Setting Status: 755011
Zone Setting Status: 755011

## 2023-08-20 ENCOUNTER — Ambulatory Visit: Payer: Self-pay | Admitting: Cardiovascular Disease

## 2023-09-17 NOTE — Progress Notes (Signed)
 Remote pacemaker transmission.

## 2023-09-17 NOTE — Addendum Note (Signed)
 Addended by: VICCI SELLER A on: 09/17/2023 11:45 AM   Modules accepted: Orders

## 2023-09-18 ENCOUNTER — Encounter

## 2023-09-18 ENCOUNTER — Other Ambulatory Visit: Payer: Self-pay | Admitting: Cardiovascular Disease

## 2023-09-18 DIAGNOSIS — I480 Paroxysmal atrial fibrillation: Secondary | ICD-10-CM

## 2023-09-18 LAB — CUP PACEART REMOTE DEVICE CHECK
Battery Impedance: 3829 Ohm
Battery Remaining Longevity: 14 mo
Battery Voltage: 2.7 V
Brady Statistic AP VP Percent: 28 %
Brady Statistic AP VS Percent: 0 %
Brady Statistic AS VP Percent: 71 %
Brady Statistic AS VS Percent: 1 %
Date Time Interrogation Session: 20250821085858
Implantable Lead Connection Status: 753985
Implantable Lead Connection Status: 753985
Implantable Lead Implant Date: 20060109
Implantable Lead Implant Date: 20060116
Implantable Lead Location: 753858
Implantable Lead Location: 753859
Implantable Lead Model: 5071
Implantable Lead Model: 5076
Implantable Pulse Generator Implant Date: 20140609
Lead Channel Impedance Value: 460 Ohm
Lead Channel Impedance Value: 488 Ohm
Lead Channel Pacing Threshold Amplitude: 1 V
Lead Channel Pacing Threshold Amplitude: 1.125 V
Lead Channel Pacing Threshold Pulse Width: 0.4 ms
Lead Channel Pacing Threshold Pulse Width: 0.4 ms
Lead Channel Setting Pacing Amplitude: 2 V
Lead Channel Setting Pacing Amplitude: 2.5 V
Lead Channel Setting Pacing Pulse Width: 0.4 ms
Lead Channel Setting Sensing Sensitivity: 4 mV
Zone Setting Status: 755011
Zone Setting Status: 755011

## 2023-09-18 NOTE — Telephone Encounter (Signed)
 Eliquis  5mg  refill request received. Patient is 78 years old, weight-91kg, Crea-1.11 on 02/08/23 via Care Everywhere from via [employer] and [facility], Diagnosis-Afib, and last seen by Daphne Barrack on 08/07/23. Dose is appropriate based on dosing criteria. Will send in refill to requested pharmacy.

## 2023-09-21 ENCOUNTER — Ambulatory Visit: Payer: Self-pay | Admitting: Cardiovascular Disease

## 2023-10-20 ENCOUNTER — Ambulatory Visit: Attending: Cardiovascular Disease

## 2023-10-21 LAB — CUP PACEART REMOTE DEVICE CHECK
Battery Impedance: 4086 Ohm
Battery Remaining Longevity: 11 mo
Battery Voltage: 2.67 V
Brady Statistic AP VP Percent: 28 %
Brady Statistic AP VS Percent: 0 %
Brady Statistic AS VP Percent: 71 %
Brady Statistic AS VS Percent: 1 %
Date Time Interrogation Session: 20250923135558
Implantable Lead Connection Status: 753985
Implantable Lead Connection Status: 753985
Implantable Lead Implant Date: 20060109
Implantable Lead Implant Date: 20060116
Implantable Lead Location: 753858
Implantable Lead Location: 753859
Implantable Lead Model: 5071
Implantable Lead Model: 5076
Implantable Pulse Generator Implant Date: 20140609
Lead Channel Impedance Value: 458 Ohm
Lead Channel Impedance Value: 487 Ohm
Lead Channel Pacing Threshold Amplitude: 1 V
Lead Channel Pacing Threshold Amplitude: 1.375 V
Lead Channel Pacing Threshold Pulse Width: 0.4 ms
Lead Channel Pacing Threshold Pulse Width: 0.4 ms
Lead Channel Setting Pacing Amplitude: 2 V
Lead Channel Setting Pacing Amplitude: 2.75 V
Lead Channel Setting Pacing Pulse Width: 0.4 ms
Lead Channel Setting Sensing Sensitivity: 4 mV
Zone Setting Status: 755011
Zone Setting Status: 755011

## 2023-10-25 ENCOUNTER — Ambulatory Visit: Payer: Self-pay | Admitting: Cardiovascular Disease

## 2023-11-03 NOTE — Progress Notes (Signed)
 Remote PPM Transmission

## 2023-11-20 ENCOUNTER — Ambulatory Visit

## 2023-11-20 DIAGNOSIS — I480 Paroxysmal atrial fibrillation: Secondary | ICD-10-CM

## 2023-11-21 LAB — CUP PACEART REMOTE DEVICE CHECK
Battery Impedance: 4252 Ohm
Battery Remaining Longevity: 8 mo
Battery Voltage: 2.68 V
Brady Statistic AP VP Percent: 29 %
Brady Statistic AP VS Percent: 0 %
Brady Statistic AS VP Percent: 70 %
Brady Statistic AS VS Percent: 0 %
Date Time Interrogation Session: 20251023075549
Implantable Lead Connection Status: 753985
Implantable Lead Connection Status: 753985
Implantable Lead Implant Date: 20060109
Implantable Lead Implant Date: 20060116
Implantable Lead Location: 753858
Implantable Lead Location: 753859
Implantable Lead Model: 5071
Implantable Lead Model: 5076
Implantable Pulse Generator Implant Date: 20140609
Lead Channel Impedance Value: 432 Ohm
Lead Channel Impedance Value: 465 Ohm
Lead Channel Pacing Threshold Amplitude: 1.125 V
Lead Channel Pacing Threshold Amplitude: 1.375 V
Lead Channel Pacing Threshold Pulse Width: 0.4 ms
Lead Channel Pacing Threshold Pulse Width: 0.4 ms
Lead Channel Setting Pacing Amplitude: 2.25 V
Lead Channel Setting Pacing Amplitude: 2.75 V
Lead Channel Setting Pacing Pulse Width: 0.4 ms
Lead Channel Setting Sensing Sensitivity: 4 mV
Zone Setting Status: 755011
Zone Setting Status: 755011

## 2023-11-24 ENCOUNTER — Ambulatory Visit: Payer: Self-pay | Admitting: Cardiovascular Disease

## 2023-11-24 NOTE — Progress Notes (Signed)
 Remote PPM Transmission

## 2023-12-01 ENCOUNTER — Encounter: Payer: Self-pay | Admitting: Radiology

## 2023-12-17 ENCOUNTER — Other Ambulatory Visit: Payer: Self-pay

## 2023-12-18 LAB — SURGICAL PATHOLOGY

## 2023-12-21 ENCOUNTER — Ambulatory Visit: Attending: Cardiovascular Disease

## 2023-12-22 ENCOUNTER — Encounter

## 2023-12-24 LAB — CUP PACEART REMOTE DEVICE CHECK
Battery Impedance: 4889 Ohm
Battery Remaining Longevity: 5 mo
Battery Voltage: 2.66 V
Brady Statistic AP VP Percent: 29 %
Brady Statistic AP VS Percent: 0 %
Brady Statistic AS VP Percent: 70 %
Brady Statistic AS VS Percent: 0 %
Date Time Interrogation Session: 20251126075418
Implantable Lead Connection Status: 753985
Implantable Lead Connection Status: 753985
Implantable Lead Implant Date: 20060109
Implantable Lead Implant Date: 20060116
Implantable Lead Location: 753858
Implantable Lead Location: 753859
Implantable Lead Model: 5071
Implantable Lead Model: 5076
Implantable Pulse Generator Implant Date: 20140609
Lead Channel Impedance Value: 471 Ohm
Lead Channel Impedance Value: 490 Ohm
Lead Channel Pacing Threshold Amplitude: 1.125 V
Lead Channel Pacing Threshold Amplitude: 1.375 V
Lead Channel Pacing Threshold Pulse Width: 0.4 ms
Lead Channel Pacing Threshold Pulse Width: 0.4 ms
Lead Channel Setting Pacing Amplitude: 2.25 V
Lead Channel Setting Pacing Amplitude: 2.75 V
Lead Channel Setting Pacing Pulse Width: 0.4 ms
Lead Channel Setting Sensing Sensitivity: 4 mV
Zone Setting Status: 755011
Zone Setting Status: 755011

## 2023-12-26 ENCOUNTER — Ambulatory Visit: Payer: Self-pay | Admitting: Cardiovascular Disease

## 2024-01-21 ENCOUNTER — Ambulatory Visit: Attending: Cardiovascular Disease

## 2024-01-22 ENCOUNTER — Encounter

## 2024-01-23 LAB — CUP PACEART REMOTE DEVICE CHECK
Battery Impedance: 5328 Ohm
Battery Remaining Longevity: 5 mo
Battery Voltage: 2.62 V
Brady Statistic AP VP Percent: 30 %
Brady Statistic AP VS Percent: 0 %
Brady Statistic AS VP Percent: 70 %
Brady Statistic AS VS Percent: 0 %
Date Time Interrogation Session: 20251225074357
Implantable Lead Connection Status: 753985
Implantable Lead Connection Status: 753985
Implantable Lead Implant Date: 20060109
Implantable Lead Implant Date: 20060116
Implantable Lead Location: 753858
Implantable Lead Location: 753859
Implantable Lead Model: 5071
Implantable Lead Model: 5076
Implantable Pulse Generator Implant Date: 20140609
Lead Channel Impedance Value: 461 Ohm
Lead Channel Impedance Value: 490 Ohm
Lead Channel Pacing Threshold Amplitude: 1.125 V
Lead Channel Pacing Threshold Amplitude: 1.25 V
Lead Channel Pacing Threshold Pulse Width: 0.4 ms
Lead Channel Pacing Threshold Pulse Width: 0.4 ms
Lead Channel Setting Pacing Amplitude: 2.25 V
Lead Channel Setting Pacing Amplitude: 2.5 V
Lead Channel Setting Pacing Pulse Width: 0.4 ms
Lead Channel Setting Sensing Sensitivity: 4 mV
Zone Setting Status: 755011
Zone Setting Status: 755011

## 2024-01-24 ENCOUNTER — Ambulatory Visit: Payer: Self-pay | Admitting: Cardiovascular Disease

## 2024-02-21 ENCOUNTER — Ambulatory Visit

## 2024-02-23 ENCOUNTER — Encounter

## 2024-02-24 ENCOUNTER — Telehealth: Payer: Self-pay | Admitting: Cardiovascular Disease

## 2024-02-24 NOTE — Telephone Encounter (Signed)
 Pt would like a c/b regarding manual transmission that she was to send but transmission is stopping half way through. Please advise

## 2024-02-25 ENCOUNTER — Telehealth: Payer: Self-pay | Admitting: Cardiovascular Disease

## 2024-02-25 NOTE — Telephone Encounter (Signed)
 Spoke with pt and ordered her a new monitor, pt aware it will be 7-10 business days

## 2024-02-25 NOTE — Telephone Encounter (Signed)
 Called re missed remote

## 2024-03-23 ENCOUNTER — Ambulatory Visit

## 2024-03-25 ENCOUNTER — Encounter

## 2024-04-26 ENCOUNTER — Encounter

## 2024-05-27 ENCOUNTER — Encounter

## 2024-06-28 ENCOUNTER — Encounter

## 2024-07-29 ENCOUNTER — Encounter

## 2024-08-30 ENCOUNTER — Encounter

## 2024-09-30 ENCOUNTER — Encounter

## 2024-11-01 ENCOUNTER — Encounter

## 2024-12-02 ENCOUNTER — Encounter

## 2025-01-03 ENCOUNTER — Encounter

## 2025-02-03 ENCOUNTER — Encounter

## 2025-03-07 ENCOUNTER — Encounter

## 2025-04-07 ENCOUNTER — Encounter

## 2025-05-09 ENCOUNTER — Encounter

## 2025-06-09 ENCOUNTER — Encounter

## 2025-07-11 ENCOUNTER — Encounter

## 2025-08-11 ENCOUNTER — Encounter

## 2025-09-12 ENCOUNTER — Encounter

## 2025-10-13 ENCOUNTER — Encounter

## 2025-11-14 ENCOUNTER — Encounter

## 2025-12-15 ENCOUNTER — Encounter

## 2026-01-16 ENCOUNTER — Encounter

## 2026-02-16 ENCOUNTER — Encounter

## 2026-03-20 ENCOUNTER — Encounter
